# Patient Record
Sex: Female | Born: 1970 | Race: Black or African American | Hispanic: No | Smoking: Current some day smoker
Health system: Southern US, Community
[De-identification: ages and names within clinical notes are randomized; demographics above are authoritative.]

## PROBLEM LIST (undated history)

## (undated) DIAGNOSIS — M549 Dorsalgia, unspecified: Secondary | ICD-10-CM

## (undated) DIAGNOSIS — G8929 Other chronic pain: Secondary | ICD-10-CM

## (undated) DIAGNOSIS — M75112 Incomplete rotator cuff tear or rupture of left shoulder, not specified as traumatic: Secondary | ICD-10-CM

## (undated) DIAGNOSIS — J45909 Unspecified asthma, uncomplicated: Secondary | ICD-10-CM

## (undated) DIAGNOSIS — R569 Unspecified convulsions: Secondary | ICD-10-CM

## (undated) DIAGNOSIS — M1990 Unspecified osteoarthritis, unspecified site: Secondary | ICD-10-CM

## (undated) DIAGNOSIS — Q851 Tuberous sclerosis: Secondary | ICD-10-CM

## (undated) HISTORY — PX: ABLATION: SHX5711

---

## 2012-05-11 DIAGNOSIS — Q048 Other specified congenital malformations of brain: Secondary | ICD-10-CM | POA: Insufficient documentation

## 2015-02-11 DIAGNOSIS — G43009 Migraine without aura, not intractable, without status migrainosus: Secondary | ICD-10-CM | POA: Insufficient documentation

## 2015-11-21 ENCOUNTER — Emergency Department (HOSPITAL_COMMUNITY): Payer: Self-pay

## 2015-11-21 ENCOUNTER — Encounter (HOSPITAL_COMMUNITY): Payer: Self-pay | Admitting: *Deleted

## 2015-11-21 ENCOUNTER — Emergency Department (HOSPITAL_COMMUNITY)
Admission: EM | Admit: 2015-11-21 | Discharge: 2015-11-22 | Disposition: A | Discharge status: Home / Self Care | Payer: Self-pay | Attending: Emergency Medicine | Admitting: Emergency Medicine

## 2015-11-21 DIAGNOSIS — R109 Unspecified abdominal pain: Secondary | ICD-10-CM | POA: Insufficient documentation

## 2015-11-21 DIAGNOSIS — J45909 Unspecified asthma, uncomplicated: Secondary | ICD-10-CM | POA: Insufficient documentation

## 2015-11-21 DIAGNOSIS — M7989 Other specified soft tissue disorders: Secondary | ICD-10-CM | POA: Insufficient documentation

## 2015-11-21 DIAGNOSIS — M25561 Pain in right knee: Secondary | ICD-10-CM | POA: Insufficient documentation

## 2015-11-21 DIAGNOSIS — F17210 Nicotine dependence, cigarettes, uncomplicated: Secondary | ICD-10-CM | POA: Insufficient documentation

## 2015-11-21 HISTORY — DX: Dorsalgia, unspecified: M54.9

## 2015-11-21 HISTORY — DX: Tuberous sclerosis: Q85.1

## 2015-11-21 HISTORY — DX: Unspecified convulsions: R56.9

## 2015-11-21 HISTORY — DX: Unspecified asthma, uncomplicated: J45.909

## 2015-11-21 NOTE — ED Provider Notes (Signed)
[facility]Y DEPT Provider Note   CSN: [id number] Arrival date & time: 11/21/15  2021  First Provider Contact:  First MD Initiated Contact with Patient 11/21/15 2205        History   Chief Complaint Chief Complaint  Patient presents with  . Knee Pain    HPI Misty Foster is a 45 y.o. female.  Patient is a 45 year old female with past medical history of seizures, asthma, osteoarthritis and tuberous sclerosis who presents to the ED with complaint of right knee pain and leg swelling. Patient reports having swelling to bilateral legs and feet for the past few weeks. She notes while she was at work this morning she began to have discomfort which she describes as "fullness and stiffness" to her right knee. Patient reports difficulty ambulating due to fullness of her right knee. Patient denies any known injury or recent fall but notes that she recently joined a gym and states she moved a few days ago where she was doing more physical activity compared to her usual routine. Patient reports symptoms worsen with flexion of her right knee. Denies fever, chills, cough, shortness of breath, chest pain, abdominal pain, nausea, vomiting, urinary symptoms, redness, warmth, numbness, tingling, weakness. Denies history of DVT/PE, recent hospitalization, recent airplane/car travel, hx of cancer or recent immobilization.     Knee Pain      Past Medical History:  Diagnosis Date  . Asthma   . Back pain   . Seizures (HCC)   . Tuberous sclerosis (HCC)     There are no active problems to display for this patient.   Past Surgical History:  Procedure Laterality Date  . ABLATION    . CESAREAN SECTION      OB History    No data available       Home Medications    Prior to Admission medications   Not on File    Family History No family history on file.  Social History Social History  Substance Use Topics  . Smoking status: Current Every Day Smoker    Types: Cigarettes  .  Smokeless tobacco: Never Used  . Alcohol use No     Allergies   Lamictal [lamotrigine]   Review of Systems Review of Systems  Cardiovascular: Positive for leg swelling.  Musculoskeletal: Positive for arthralgias (right knee) and joint swelling.  All other systems reviewed and are negative.    Physical Exam Updated Vital Signs BP 132/76 (BP Location: Right Arm)   Pulse 65   Temp 98.4 F (36.9 C) (Oral)   Resp 19   Ht  (1.651 m)   Wt (!) 158.1 kg   LMP  (Approximate)   SpO2 95%   BMI 58.01 kg/m   Physical Exam  Constitutional: She is oriented to person, place, and time. She appears well-developed and well-nourished.  Morbidly obese female  HENT:  Head: Normocephalic and atraumatic.  Eyes: Conjunctivae and EOM are normal. Right eye exhibits no discharge. Left eye exhibits no discharge. No scleral icterus.  Neck: Normal range of motion. Neck supple.  Cardiovascular: Normal rate, regular rhythm, normal heart sounds and intact distal pulses.   Pulmonary/Chest: Effort normal and breath sounds normal. No respiratory distress. She has no wheezes. She has no rales. She exhibits no tenderness.  Abdominal: Soft. She exhibits no distension. There is tenderness.  Musculoskeletal: She exhibits edema and tenderness.       Right knee: She exhibits decreased range of motion and swelling. She exhibits no effusion, no  ecchymosis, no deformity, no laceration, no erythema, normal alignment, no LCL laxity, normal patellar mobility and no MCL laxity. Tenderness found.       Legs: Mild nonpitting edema noted to bilateral lower extremities. Mild swelling noted to right suprapatellar region, mild tenderness to palpation. Decreased range of motion of right knee due to reported pain. No ACL/MCL/LCL/PCL laxity noted. 2+ DP pulses. Sensation grossly intact. Cap refill less than 2.  Neurological: She is alert and oriented to person, place, and time.  Skin: Skin is warm and dry.  Nursing note and  vitals reviewed.    ED Treatments / Results  Labs (all labs ordered are listed, but only abnormal results are displayed) Labs Reviewed  CBC WITH DIFFERENTIAL/PLATELET - Abnormal; Notable for the following:       Result Value   RBC 3.58 (*)    Hemoglobin 10.6 (*)    HCT 32.9 (*)    All other components within normal limits  COMPREHENSIVE METABOLIC PANEL - Abnormal; Notable for the following:    Calcium 8.8 (*)    Albumin 3.1 (*)    ALT 11 (*)    Total Bilirubin 0.1 (*)    All other components within normal limits    EKG  EKG Interpretation None       Radiology Dg Knee Complete 4 Views Right  Result Date: 11/21/2015 CLINICAL DATA:  Right knee pain and swelling, no known injury, initial encounter EXAM: RIGHT KNEE - COMPLETE 4+ VIEW COMPARISON:  None. FINDINGS: Mild medial joint space narrowing is noted. No acute fracture or dislocation is noted. No joint effusion is seen. No other soft tissue abnormality is noted. IMPRESSION: Mild degenerative changes without acute abnormality. Electronically Signed   By: Alcide Clever M.D.   On: 11/21/2015 23:10   Procedures Procedures (including critical care time)  Medications Ordered in ED Medications - No data to display   Initial Impression / Assessment and Plan / ED Course  I have reviewed the triage vital signs and the nursing notes.  Pertinent labs & imaging results that were available during my care of the patient were reviewed by me and considered in my medical decision making (see chart for details).  Clinical Course    Pt Presents with bilateral leg swelling for the past month and right knee pain that started this morning. Patient endorses increased activity and reports recently joined a gym and notes she moved a few days ago. Denies any recent fall or specific injury. No DVT risk factors. VSS. Exam revealed mild nonpitting edema to bilateral lower extremities. Mild swelling noted to right anterior knee with mild tenderness,  decreased flexion of right knee due to reported pain, remaining right knee exam unremarkable. Bilateral lower extremities neurovascularly intact. Well's score 0. I do not suspect DVT at this time. Right knee x-ray negative. Labs unremarkable. Plan to d/c pt home with symptomatic tx and advise pt to follow up with PCP in the next week for follow up. I feel pt is appropriate to be d/c home with outpatient follow up and does not warrant emergent initiation of diuretics at this time, BLE swelling mild on exam. Discussed strict return precautions with pt.   Final Clinical Impressions(s) / ED Diagnoses   Final diagnoses:  Right knee pain  Leg swelling    New Prescriptions New Prescriptions   No medications on file     Barrett Henle, PA-C 11/22/15 0102    Zadie Rhine, MD 11/22/15 [phone]

## 2015-11-21 NOTE — ED Triage Notes (Signed)
Patient presents stating she has had swelling in her feet for several weeks (has an appointment for pain mgt on the 31st) but today she is c/o pain behind the  right knee having difficulty bending it.  This nurse noticed sweeling to the legs bilaterally

## 2015-11-22 LAB — COMPREHENSIVE METABOLIC PANEL
ALBUMIN: 3.1 g/dL — AB (ref 3.5–5.0)
ALT: 11 U/L — ABNORMAL LOW (ref 14–54)
ANION GAP: 7 (ref 5–15)
AST: 16 U/L (ref 15–41)
Alkaline Phosphatase: 70 U/L (ref 38–126)
BUN: 7 mg/dL (ref 6–20)
CHLORIDE: 106 mmol/L (ref 101–111)
CO2: 27 mmol/L (ref 22–32)
Calcium: 8.8 mg/dL — ABNORMAL LOW (ref 8.9–10.3)
Creatinine, Ser: 0.74 mg/dL (ref 0.44–1.00)
GFR calc Af Amer: >60 mL/min (ref >60)
Glucose, Bld: 94 mg/dL (ref 65–99)
POTASSIUM: 3.8 mmol/L (ref 3.5–5.1)
Sodium: 140 mmol/L (ref 135–145)
TOTAL PROTEIN: 6.7 g/dL (ref 6.5–8.1)
Total Bilirubin: 0.1 mg/dL — ABNORMAL LOW (ref 0.3–1.2)

## 2015-11-22 LAB — CBC WITH DIFFERENTIAL/PLATELET
BASOS ABS: 0 10*3/uL (ref 0.0–0.1)
BASOS PCT: 0 %
EOS PCT: 4 %
Eosinophils Absolute: 0.2 10*3/uL (ref 0.0–0.7)
HCT: 32.9 % — ABNORMAL LOW (ref 36.0–46.0)
Hemoglobin: 10.6 g/dL — ABNORMAL LOW (ref 12.0–15.0)
Lymphocytes Relative: 37 %
Lymphs Abs: 2.3 10*3/uL (ref 0.7–4.0)
MCH: 29.6 pg (ref 26.0–34.0)
MCHC: 32.2 g/dL (ref 30.0–36.0)
MCV: 91.9 fL (ref 78.0–100.0)
MONO ABS: 0.4 10*3/uL (ref 0.1–1.0)
MONOS PCT: 6 %
Neutro Abs: 3.3 10*3/uL (ref 1.7–7.7)
Neutrophils Relative %: 53 %
PLATELETS: 175 10*3/uL (ref 150–400)
RBC: 3.58 MIL/uL — ABNORMAL LOW (ref 3.87–5.11)
RDW: 13.3 % (ref 11.5–15.5)
WBC: 6.2 10*3/uL (ref 4.0–10.5)

## 2015-11-22 NOTE — ED Notes (Signed)
Pt verbalized understanding of d/c instructions and has no further questions. Pt stable and NAD. Pt d/cf home with daughter driving. Pt removed all belongings from room.

## 2015-11-22 NOTE — Discharge Instructions (Signed)
I recommend taking 600mg  Ibuprofen every 6 hours as needed for pain relief. You may also apply ice to your right knee for 15-20 minutes 3-4 times daily to help with pain and swelling. Recommend following up with the primary care clinic listed above for further management of your leg swelling and follow-up of your right knee pain. Return to the emergency department if symptoms worsen or new onset of fever, chest pain, shortness of breath, abdominal pain, numbness, tingling, weakness, redness.

## 2015-12-05 ENCOUNTER — Encounter (HOSPITAL_COMMUNITY): Payer: Self-pay | Admitting: *Deleted

## 2015-12-05 ENCOUNTER — Emergency Department (HOSPITAL_COMMUNITY): Payer: Self-pay

## 2015-12-05 ENCOUNTER — Emergency Department (HOSPITAL_COMMUNITY)
Admission: EM | Admit: 2015-12-05 | Discharge: 2015-12-05 | Disposition: A | Discharge status: Home / Self Care | Payer: Self-pay | Attending: Emergency Medicine | Admitting: Emergency Medicine

## 2015-12-05 DIAGNOSIS — Y999 Unspecified external cause status: Secondary | ICD-10-CM | POA: Insufficient documentation

## 2015-12-05 DIAGNOSIS — X58XXXA Exposure to other specified factors, initial encounter: Secondary | ICD-10-CM | POA: Insufficient documentation

## 2015-12-05 DIAGNOSIS — F17210 Nicotine dependence, cigarettes, uncomplicated: Secondary | ICD-10-CM | POA: Insufficient documentation

## 2015-12-05 DIAGNOSIS — Y929 Unspecified place or not applicable: Secondary | ICD-10-CM | POA: Insufficient documentation

## 2015-12-05 DIAGNOSIS — J45909 Unspecified asthma, uncomplicated: Secondary | ICD-10-CM | POA: Insufficient documentation

## 2015-12-05 DIAGNOSIS — S76219A Strain of adductor muscle, fascia and tendon of unspecified thigh, initial encounter: Secondary | ICD-10-CM

## 2015-12-05 DIAGNOSIS — Y939 Activity, unspecified: Secondary | ICD-10-CM | POA: Insufficient documentation

## 2015-12-05 DIAGNOSIS — S76212A Strain of adductor muscle, fascia and tendon of left thigh, initial encounter: Secondary | ICD-10-CM | POA: Insufficient documentation

## 2015-12-05 HISTORY — DX: Dorsalgia, unspecified: M54.9

## 2015-12-05 HISTORY — DX: Other chronic pain: G89.29

## 2015-12-05 MED ORDER — KETOROLAC TROMETHAMINE 30 MG/ML IJ SOLN
30.0000 mg | Freq: Once | INTRAMUSCULAR | Status: AC
  Administered 2015-12-05: 30 mg via INTRAMUSCULAR
  Filled 2015-12-05: qty 1

## 2015-12-05 NOTE — ED Provider Notes (Signed)
[facility] Provider Note   CSN: [id number] Arrival date & time: 12/05/15  1191  First Provider Contact:  First MD Initiated Contact with Patient 12/05/15 [phone]        History   Chief Complaint Chief Complaint  Patient presents with  . Pain  . Leg Pain    HPI Misty Foster is a 45 y.o. female.  Misty Foster is a 45 y.o. female  with a PMH of chronic back pain, asthma, seizures who presents to the Emergency Department complaining of worsening left hip/groin pain 2 days. Patient states she recently moved to [facility] and has been unpacking/moving over the last 2-3 weeks. Pain is worse with ambulation and moving leg away from body. Improved when resting, lying still. Taking mobic and baclofen with little relief (these meds were given for chronic back pain). Patient states she has been walking with a limp since yesterday, but ambulatory. Hx of chronic knee and low back pain, but has never experienced pain in this area before.    The history is provided by the patient and medical records. No language interpreter was used.  Leg Pain      Past Medical History:  Diagnosis Date  . Asthma   . Back pain   . Chronic back pain   . Seizures (HCC)   . Tuberous sclerosis (HCC)     There are no active problems to display for this patient.   Past Surgical History:  Procedure Laterality Date  . ABLATION    . CESAREAN SECTION      OB History    No data available       Home Medications    Prior to Admission medications   Not on File    Family History No family history on file.  Social History Social History  Substance Use Topics  . Smoking status: Current Every Day Smoker    Types: Cigarettes  . Smokeless tobacco: Never Used  . Alcohol use No     Allergies   Lamictal [lamotrigine]   Review of Systems Review of Systems  Gastrointestinal: Negative for abdominal pain, nausea and vomiting.  Musculoskeletal: Positive for arthralgias.    Skin: Negative for color change.     Physical Exam Updated Vital Signs BP 120/71 (BP Location: Right Wrist)   Pulse (!) 57   Temp 97.7 F (36.5 C) (Oral)   Resp 17   Ht  (1.651 m)   Wt (!) 154.2 kg   LMP  (Approximate) Comment: PT has a referral to [facility]  for irregular menses. Pt has not been to [facility]  SpO2 100%   BMI 56.58 kg/m   Physical Exam  Constitutional: She is oriented to person, place, and time.  WDWN obese female in NAD.   HENT:  Head: Normocephalic and atraumatic.  Cardiovascular: Normal rate, regular rhythm and normal heart sounds.   2+ DP bilaterally.   Pulmonary/Chest: Effort normal and breath sounds normal. No respiratory distress.  Abdominal: Soft. Bowel sounds are normal. She exhibits no distension. There is no tenderness.  Musculoskeletal:  TTP of left groin. No hernia appreciated. Full ROM, but pain with hip flexion and abduction. 5/5 muscle strength in bilateral LE's. No calf or knee tenderness. No midline T/L spine tenderness. No overlying skin changes.   Neurological: She is alert and oriented to person, place, and time.  Bilateral lower extremities neurovascularly intact.   Skin: Skin is warm and dry.  Nursing note and vitals reviewed.  ED Treatments / Results  Labs (all labs ordered are listed, but only abnormal results are displayed) Labs Reviewed - No data to display  EKG  EKG Interpretation None       Radiology Dg Hip Unilat W Or Wo Pelvis 2-3 Views Left  Result Date: 12/05/2015 CLINICAL DATA:  Left hip and groin pain today. No known injury. Initial encounter. EXAM: DG HIP (WITH OR WITHOUT PELVIS) 2-3V LEFT COMPARISON:  None. FINDINGS: There is no evidence of hip fracture or dislocation. There is no evidence of arthropathy or other focal bone abnormality. IMPRESSION: Negative exam. Electronically Signed   By: Drusilla Kannerhomas  Dalessio M.D.   On: 12/05/2015 08:27    Procedures Procedures (including critical care  time)  Medications Ordered in ED Medications  ketorolac (TORADOL) 30 MG/ML injection 30 mg (30 mg Intramuscular Given 12/05/15 0846)     Initial Impression / Assessment and Plan / ED Course  I have reviewed the triage vital signs and the nursing notes.  Pertinent labs & imaging results that were available during my care of the patient were reviewed by me and considered in my medical decision making (see chart for details).  Clinical Course   Misty Foster is a 45 y.o. female who presents to ED for evaluation of left groin pain x 2 days. Pain is worse with movement. She is able to ambulate, however does have antalgic gait. On exam, she is tender to palpation of left groin - No hernias appreciated. No abdominal pain. No new back or knee pain. X-ray obtained which was unremarkable. Toradol IM for pain relief while in ED. Patient has a follow-up appointment with her sports medicine physician on Friday. Symptomatic home care including continuation of meloxicam as well as ice, rest and keep scheduled appointment on Friday for recheck of symptoms. Reasons to return to ED discussed and all questions answered.  Final Clinical Impressions(s) / ED Diagnoses   Final diagnoses:  Groin strain, initial encounter    New Prescriptions There are no discharge medications for this patient.    [facility] Ray[facility] Memorial Veterans CenterJaime Pilcher Kassandra Meriweather, PA-C 12/05/15 [phone]    Vanetta MuldersScott Zackowski, MD 12/06/15 1038

## 2015-12-05 NOTE — ED Triage Notes (Signed)
PT rfeports lower lt back pain into LT hip. Pt reports her LT leg locks up.

## 2015-12-05 NOTE — ED Notes (Signed)
patient placed in gown

## 2015-12-05 NOTE — Discharge Instructions (Signed)
You had an x-ray of your left hip and pelvis which was negative. Keep your scheduled appointment with your sports medicine physician on Friday for recheck of symptoms. Ice affected area for pain relief. Continue taking your myopic as needed for pain relief as well. Return to ER for any new or worsening symptoms, any additional concerns.

## 2015-12-05 NOTE — ED Notes (Signed)
Declined W/C at D/C and was escorted to lobby by RN. 

## 2016-02-05 ENCOUNTER — Emergency Department (HOSPITAL_COMMUNITY)
Admission: EM | Admit: 2016-02-05 | Discharge: 2016-02-05 | Disposition: A | Discharge status: Home / Self Care | Payer: Self-pay | Attending: Emergency Medicine | Admitting: Emergency Medicine

## 2016-02-05 ENCOUNTER — Encounter (HOSPITAL_COMMUNITY): Payer: Self-pay | Admitting: Emergency Medicine

## 2016-02-05 DIAGNOSIS — J45909 Unspecified asthma, uncomplicated: Secondary | ICD-10-CM | POA: Insufficient documentation

## 2016-02-05 DIAGNOSIS — Z5321 Procedure and treatment not carried out due to patient leaving prior to being seen by health care provider: Secondary | ICD-10-CM | POA: Insufficient documentation

## 2016-02-05 DIAGNOSIS — M542 Cervicalgia: Secondary | ICD-10-CM | POA: Insufficient documentation

## 2016-02-05 DIAGNOSIS — F17210 Nicotine dependence, cigarettes, uncomplicated: Secondary | ICD-10-CM | POA: Insufficient documentation

## 2016-02-05 DIAGNOSIS — M62830 Muscle spasm of back: Secondary | ICD-10-CM | POA: Insufficient documentation

## 2016-02-05 NOTE — ED Triage Notes (Signed)
Pt c/o back spasms and a knot in her neck. Pt hx of back pain and spasms and its gotten worse. Pt neck is sore on the right side, no obvious swelling.

## 2016-04-01 ENCOUNTER — Emergency Department (HOSPITAL_COMMUNITY)
Admission: EM | Admit: 2016-04-01 | Discharge: 2016-04-01 | Disposition: A | Discharge status: Home / Self Care | Payer: BLUE CROSS/BLUE SHIELD | Attending: Emergency Medicine | Admitting: Emergency Medicine

## 2016-04-01 ENCOUNTER — Encounter (HOSPITAL_COMMUNITY): Payer: Self-pay | Admitting: Emergency Medicine

## 2016-04-01 DIAGNOSIS — J45909 Unspecified asthma, uncomplicated: Secondary | ICD-10-CM | POA: Diagnosis not present

## 2016-04-01 DIAGNOSIS — B3731 Acute candidiasis of vulva and vagina: Secondary | ICD-10-CM

## 2016-04-01 DIAGNOSIS — B373 Candidiasis of vulva and vagina: Secondary | ICD-10-CM | POA: Diagnosis not present

## 2016-04-01 DIAGNOSIS — N898 Other specified noninflammatory disorders of vagina: Secondary | ICD-10-CM | POA: Diagnosis present

## 2016-04-01 DIAGNOSIS — F17210 Nicotine dependence, cigarettes, uncomplicated: Secondary | ICD-10-CM | POA: Diagnosis not present

## 2016-04-01 MED ORDER — TRIAMCINOLONE ACETONIDE 0.1 % EX CREA: 1.0000 "application " | TOPICAL_CREAM | Freq: Two times a day (BID) | CUTANEOUS | 0 refills | Status: DC

## 2016-04-01 MED ORDER — FLUCONAZOLE 200 MG PO TABS: 200.0000 mg | ORAL_TABLET | Freq: Every day | ORAL | 0 refills | Status: AC

## 2016-04-01 NOTE — ED Triage Notes (Signed)
Pt states she has had vaginal itching starting 1-2 weeks ago after having intercourse with her current partner. Pt denies discharge

## 2016-04-01 NOTE — ED Provider Notes (Signed)
Emergency Department Provider Note   I have reviewed the triage vital signs and the nursing notes.   HISTORY  Chief Complaint Vaginal Itching   HPI Misty Foster is a 45 y.o. female past medical history significant for asthma, chronic back pain and seizures came to the ED with complaint of vagina itchiness for 1-1/2 week. She denies any discharge or bed order. She denies any lower abdominal pain, dysuria, burning micturition, fever or chills. She states that she recently started a new deodorant, and that might be causing some irritation so she stopped it couple of days ago.   Past Medical History:  Diagnosis Date  . Asthma   . Back pain   . Chronic back pain   . Seizures (HCC)   . Tuberous sclerosis (HCC)     There are no active problems to display for this patient.   Past Surgical History:  Procedure Laterality Date  . ABLATION    . CESAREAN SECTION        Allergies Lamictal [lamotrigine]  History reviewed. No pertinent family history.  Social History Social History  Substance Use Topics  . Smoking status: Current Every Day Smoker    Types: Cigarettes  . Smokeless tobacco: Never Used  . Alcohol use No    Review of Systems Constitutional: No fever/chills Eyes: No visual changes. ENT: No sore throat. Cardiovascular: Denies chest pain. Respiratory: Denies shortness of breath. Gastrointestinal: No abdominal pain.  No nausea, no vomiting.  No diarrhea.  No constipation. Genitourinary: vaginal itching Musculoskeletal: Negative for back pain. Skin: Negative for rash. Neurological: Negative for headaches, focal weakness or numbness. 10-point ROS otherwise negative.  ____________________________________________   PHYSICAL EXAM:  VITAL SIGNS: ED Triage Vitals  Enc Vitals Group     BP 04/01/16 0704 130/88     Pulse Rate 04/01/16 0704 72     Resp 04/01/16 0704 18     Temp 04/01/16 0704 97.6 F (36.4 C)     Temp Source 04/01/16 0704 Oral       SpO2 04/01/16 0704 100 %     Weight 04/01/16 0705 (!) 342 lb (155.1 kg)     Height 04/01/16 0705 5\' 5"  (1.651 m)     Head Circumference --      Peak Flow --      Pain Score --      Pain Loc --      Pain Edu? --      Excl. in GC? --     Constitutional: Alert and oriented. Well appearing and in no acute distress. Eyes: Conjunctivae are normal. PERRL. EOMI. Head: Atraumatic. Mouth/Throat: Mucous membranes are moist.  Oropharynx non-erythematous. Neck: No stridor.  No meningeal signs.  Cardiovascular: Normal rate, regular rhythm. Good peripheral circulation. Grossly normal heart sounds.   Respiratory: Normal respiratory effort.  No retractions. Lungs CTAB. Gastrointestinal: Soft and nontender. No distention.  Genitourinary: Thick milky white vaginal discharge, with few erythematic lesions in pubic hair area,no ulceration. Musculoskeletal: No lower extremity tenderness nor edema. No gross deformities of extremities. Neurologic:  Normal speech and language. No gross focal neurologic deficits are appreciated.  Skin:  Skin is warm, dry and intact. No rash noted.  ____________________________________________   LABS (all labs ordered are listed, but only abnormal results are displayed)  Labs Reviewed - No data to display ____________________________________________  EKG  None ____________________________________________  RADIOLOGY  No results found.  ____________________________________________   PROCEDURES  Procedure(s) performed:   Procedures   ____________________________________________   INITIAL  IMPRESSION / ASSESSMENT AND PLAN / ED COURSE  Pertinent labs & imaging results that were available during my care of the patient were reviewed by me and considered in my medical decision making (see chart for details).  As her symptoms and exam are consistent with vaginal candidias with some contact dermatitis.  She can be discharged home with Diflucan and  triamcinolone cream. [place] 6 [place] ago, she has not established any care in this area yet. She was advised to find a PCP in this area, so she can follow-up with them.  FINAL CLINICAL IMPRESSION(S) / ED DIAGNOSES  Final diagnoses:  None  Vaginal Candiditis.   MEDICATIONS GIVEN DURING THIS VISIT:  Medications - No data to display   NEW OUTPATIENT MEDICATIONS STARTED DURING THIS VISIT:  New Prescriptions   No medications on file      Note:  This document was prepared using Dragon voice recognition software and may include unintentional dictation errors.  Alona BeneJoshua Long, MD Emergency Medicine   Arnetha CourserSumayya Shelle Galdamez, MD 04/01/16 [id number]    Melene Planan Floyd, DO 04/01/16 [id number]

## 2016-04-01 NOTE — ED Notes (Signed)
Pelvic cart at bedside. 

## 2016-05-01 DIAGNOSIS — M549 Dorsalgia, unspecified: Secondary | ICD-10-CM

## 2016-05-01 DIAGNOSIS — G8929 Other chronic pain: Secondary | ICD-10-CM | POA: Insufficient documentation

## 2016-05-07 ENCOUNTER — Emergency Department (HOSPITAL_COMMUNITY)
Admission: EM | Admit: 2016-05-07 | Discharge: 2016-05-07 | Disposition: A | Discharge status: Home / Self Care | Payer: Managed Care, Other (non HMO) | Attending: Emergency Medicine | Admitting: Emergency Medicine

## 2016-05-07 ENCOUNTER — Emergency Department (HOSPITAL_COMMUNITY): Payer: Managed Care, Other (non HMO)

## 2016-05-07 ENCOUNTER — Encounter (HOSPITAL_COMMUNITY): Payer: Self-pay | Admitting: *Deleted

## 2016-05-07 DIAGNOSIS — Z87891 Personal history of nicotine dependence: Secondary | ICD-10-CM | POA: Diagnosis not present

## 2016-05-07 DIAGNOSIS — J45909 Unspecified asthma, uncomplicated: Secondary | ICD-10-CM | POA: Insufficient documentation

## 2016-05-07 DIAGNOSIS — R0602 Shortness of breath: Secondary | ICD-10-CM | POA: Insufficient documentation

## 2016-05-07 DIAGNOSIS — R079 Chest pain, unspecified: Secondary | ICD-10-CM | POA: Diagnosis not present

## 2016-05-07 DIAGNOSIS — R030 Elevated blood-pressure reading, without diagnosis of hypertension: Secondary | ICD-10-CM | POA: Diagnosis not present

## 2016-05-07 DIAGNOSIS — Z79899 Other long term (current) drug therapy: Secondary | ICD-10-CM | POA: Diagnosis not present

## 2016-05-07 DIAGNOSIS — R51 Headache: Secondary | ICD-10-CM | POA: Diagnosis present

## 2016-05-07 HISTORY — DX: Unspecified osteoarthritis, unspecified site: M19.90

## 2016-05-07 LAB — I-STAT TROPONIN, ED
TROPONIN I, POC: 0.01 ng/mL (ref 0.00–0.08)
Troponin i, poc: 0 ng/mL (ref 0.00–0.08)

## 2016-05-07 LAB — BASIC METABOLIC PANEL
ANION GAP: 7 (ref 5–15)
BUN: 7 mg/dL (ref 6–20)
CALCIUM: 8.8 mg/dL — AB (ref 8.9–10.3)
CO2: 27 mmol/L (ref 22–32)
Chloride: 106 mmol/L (ref 101–111)
Creatinine, Ser: 0.69 mg/dL (ref 0.44–1.00)
GFR calc Af Amer: >60 mL/min (ref >60)
GFR calc non Af Amer: >60 mL/min (ref >60)
GLUCOSE: 112 mg/dL — AB (ref 65–99)
Potassium: 3.6 mmol/L (ref 3.5–5.1)
Sodium: 140 mmol/L (ref 135–145)

## 2016-05-07 LAB — CBC WITH DIFFERENTIAL/PLATELET
BASOS ABS: 0 10*3/uL (ref 0.0–0.1)
Basophils Relative: 0 %
EOS PCT: 3 %
Eosinophils Absolute: 0.1 10*3/uL (ref 0.0–0.7)
HEMATOCRIT: 34.2 % — AB (ref 36.0–46.0)
Hemoglobin: 11.2 g/dL — ABNORMAL LOW (ref 12.0–15.0)
LYMPHS ABS: 1.8 10*3/uL (ref 0.7–4.0)
LYMPHS PCT: 33 %
MCH: 30.3 pg (ref 26.0–34.0)
MCHC: 32.7 g/dL (ref 30.0–36.0)
MCV: 92.4 fL (ref 78.0–100.0)
MONOS PCT: 5 %
Monocytes Absolute: 0.3 10*3/uL (ref 0.1–1.0)
NEUTROS ABS: 3.4 10*3/uL (ref 1.7–7.7)
Neutrophils Relative %: 59 %
PLATELETS: 188 10*3/uL (ref 150–400)
RBC: 3.7 MIL/uL — ABNORMAL LOW (ref 3.87–5.11)
RDW: 13.2 % (ref 11.5–15.5)
WBC: 5.6 10*3/uL (ref 4.0–10.5)

## 2016-05-07 LAB — BRAIN NATRIURETIC PEPTIDE: B Natriuretic Peptide: 64.5 pg/mL (ref 0.0–100.0)

## 2016-05-07 LAB — I-STAT BETA HCG BLOOD, ED (MC, WL, AP ONLY): I-stat hCG, quantitative: <5 m[IU]/mL (ref <5)

## 2016-05-07 NOTE — ED Notes (Signed)
Eating crackers

## 2016-05-07 NOTE — ED Triage Notes (Signed)
C/o several recent elevated blood pressure reading since first of Dec. C/o resp problems with increased sob states she has noticed more sob. is currently being seen at  Pain clinic, c/o different feeling in her chest since starting a new medication, describes at tingling feeling

## 2016-05-07 NOTE — ED Provider Notes (Addendum)
[facility] DEPT Provider Note   CSN: [id number] Arrival date & time: 05/07/16  6213     History   Chief Complaint Chief Complaint  Patient presents with  . Headache    HPI Misty Foster is a 46 y.o. female.  HPI  46 year old female who presents with concern for elevated BP over past month. She has a history of chronic low back pain, seizure disorder, and asthma. States that around Christmas time she had an influenza-like illness, and during this illness and afterwards noted more elevated blood pressures. Had one reading at the doctor's office showing 190-200 systolic during her illness. She had bariatric surgery appointment over this past month as well, showing a blood pressure of 145/90. States that during this past year she has had a 50-60 pound weight gain. She has occasionally had headaches and the feeling of dizziness. At times at work feels very fatigued. Has also noticed that since being started on duloxetine and meloxicam she has had episodes of tingling across her chest wall. She stopped these medications, but was still having these intermittent symptoms. 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 Since her influenza illness, she has also noted increased shortness of breath with activity. Quit smoking one month ago, but still having symptoms. No syncope, near syncope. No current fever or chills. No leg swelling, leg pain, orthopnea and PND.  Past Medical History:  Diagnosis Date  . Arthritis   . Asthma   . Back pain   . Chronic back pain   . Seizures (HCC)   . Tuberous sclerosis (HCC)     There are no active problems to display for this patient.   Past Surgical History:  Procedure Laterality Date  . ABLATION    . CESAREAN SECTION      OB History    No data available       Home Medications    Prior to Admission medications   Medication Sig Start Date End Date  Taking? Authorizing Provider  albuterol (VENTOLIN HFA) 108 (90 Base) MCG/ACT inhaler Inhale 1 puff into the lungs every 4 (four) hours as needed for wheezing. 09/13/15  Yes Historical Provider, MD  baclofen (LIORESAL) 10 MG tablet Take 20 mg by mouth daily. 05/03/16  Yes Historical Provider, MD  diphenhydramine-acetaminophen (TYLENOL PM) 25-500 MG TABS tablet Take 1 tablet by mouth at bedtime as needed (sleep).   Yes Historical Provider, MD  PHENobarbital (LUMINAL) 97.2 MG tablet Take 1.5 tablets by mouth every evening. 04/30/16  Yes Historical Provider, MD  triamcinolone cream (KENALOG) 0.1 % Apply 1 application topically 2 (two) times daily. Patient not taking: Reported on 05/07/2016 04/01/16   Arnetha Courser, MD    Family History History reviewed. No pertinent family history.  Social History Social History  Substance Use Topics  . Smoking status: Former Smoker    Types: Cigarettes  . Smokeless tobacco: Never Used  . Alcohol use No     Allergies   Lamictal [lamotrigine] and Hydrocodone-acetaminophen   Review of Systems Review of Systems 10/14 systems reviewed and are negative other than those stated in the HPI   Physical Exam Updated Vital Signs BP (!) 123/49 (BP Location: Right Arm)   Pulse 74   Temp 97.8 F (36.6 C) (Oral)   Resp 19   Ht 5\' 4"  (1.626 m)   Wt (!) 342 lb (155.1 kg)   LMP 04/13/2016 (Approximate)  SpO2 99%   BMI 58.70 kg/m   Physical Exam Physical Exam  Nursing note and vitals reviewed. Constitutional: Well developed, well nourished, non-toxic, and in no acute distress Head: Normocephalic and atraumatic.  Mouth/Throat: Oropharynx is clear and moist.  Neck: Normal range of motion. Neck supple.  Cardiovascular: Normal rate and regular rhythm.   Pulmonary/Chest: Effort normal and breath sounds normal.  Abdominal: Soft. Obese There is no tenderness. There is no rebound and no guarding.  Musculoskeletal: Normal range of motion. trace pedal  edema Neurological: Alert, no facial droop, fluent speech, moves all extremities symmetrically Skin: Skin is warm and dry.  Psychiatric: Cooperative   ED Treatments / Results  Labs (all labs ordered are listed, but only abnormal results are displayed) Labs Reviewed  CBC WITH DIFFERENTIAL/PLATELET - Abnormal; Notable for the following:       Result Value   RBC 3.70 (*)    Hemoglobin 11.2 (*)    HCT 34.2 (*)    All other components within normal limits  BASIC METABOLIC PANEL - Abnormal; Notable for the following:    Glucose, Bld 112 (*)    Calcium 8.8 (*)    All other components within normal limits  BRAIN NATRIURETIC PEPTIDE  I-STAT BETA HCG BLOOD, ED ([facility], [facility], [facility] ONLY)  I-STAT TROPOININ, ED  I-STAT TROPOININ, ED    EKG  EKG Interpretation  Date/Time:  Wednesday May 07 2016 10:30:27 EST Ventricular Rate:  64 PR Interval:  154 QRS Duration: 104 QT Interval:  438 QTC Calculation: 452 R Axis:   68 Text Interpretation:  Sinus rhythm similar to prior EKG  Confirmed by Rufus Beske MD, Alesha Jaffee ([id number]) on 05/07/2016 10:38:45 AM       Radiology Dg Chest 2 View  Result Date: 05/07/2016 CLINICAL DATA:  Increased back pain over the past 2 weeks with development of mid chest discomfort over the past 2-3 days. Discontinued smoking 2 months ago. No known cardiopulmonary history. EXAM: CHEST  2 VIEW COMPARISON:  None in PACs FINDINGS: The lungs are adequately inflated and clear. The heart and pulmonary vascularity are normal. The mediastinum is normal in width. There is no pleural effusion. The bony thorax exhibits no acute abnormality. IMPRESSION: There is no active cardiopulmonary disease. The observed bony thorax exhibits no acute abnormality. Electronically Signed   By: David  Swazil[place]Jordan M.D.   On: 05/07/2016 09:05    Procedures Procedures (including critical care time)  Medications Ordered in ED Medications - No data to display   Initial Impression / Assessment and Plan / ED Course  I  have reviewed the triage vital signs and the nursing notes.  Pertinent labs & imaging results that were available during my care of the patient were reviewed by me and considered in my medical decision making (see chart for details).  Clinical Course    Initially hypertensive SBP 160s, but well appearing. BP normalizes w/o intervention while in ED. In regards to episodic sob and chest discomfort, is low risk for ACS with heart score 3. Serial troponin and EKG w/o ischemia. CXR visualized and unremarkable. Presentation not concerning for PE, dissection or other serious intrathoracic or cardiopulmonary processes. She has follow-up in 1 week with PCP. She is stable for discharge with continued outpatient work up and management. Strict return and follow-up instructions reviewed. She expressed understanding of all discharge instructions and felt comfortable with the plan of care.   Final Clinical Impressions(s) / ED Diagnoses   Final diagnoses:  Nonspecific chest pain  Elevated blood  pressure reading    New Prescriptions New Prescriptions   No medications on file     Lavera Guise, MD 05/07/16 1322    Lavera Guise, MD 05/07/16 1323

## 2016-05-07 NOTE — ED Notes (Signed)
Patient ambulated to bathroom without difficulty.

## 2016-05-07 NOTE — Discharge Instructions (Signed)
Your heart work up has been reassuring here today. Please follow-up with your PCP next Tuesday as scheduled.  Please return without fail for worsening symptoms, including passing out, worsening pain, difficulty breathing or any other symptoms concerning to you.

## 2016-05-07 NOTE — ED Notes (Signed)
Patient transported to x-ray. ?

## 2016-05-13 DIAGNOSIS — G8929 Other chronic pain: Secondary | ICD-10-CM | POA: Diagnosis not present

## 2016-05-13 DIAGNOSIS — M519 Unspecified thoracic, thoracolumbar and lumbosacral intervertebral disc disorder: Secondary | ICD-10-CM | POA: Diagnosis not present

## 2016-05-13 DIAGNOSIS — G40909 Epilepsy, unspecified, not intractable, without status epilepticus: Secondary | ICD-10-CM | POA: Diagnosis not present

## 2016-05-13 DIAGNOSIS — M1990 Unspecified osteoarthritis, unspecified site: Secondary | ICD-10-CM | POA: Diagnosis not present

## 2016-05-13 DIAGNOSIS — Z6841 Body Mass Index (BMI) 40.0 and over, adult: Secondary | ICD-10-CM | POA: Diagnosis not present

## 2016-05-13 DIAGNOSIS — J45909 Unspecified asthma, uncomplicated: Secondary | ICD-10-CM | POA: Diagnosis not present

## 2016-05-21 ENCOUNTER — Other Ambulatory Visit: Payer: Self-pay | Admitting: Obstetrics & Gynecology

## 2016-05-21 ENCOUNTER — Other Ambulatory Visit (HOSPITAL_COMMUNITY)
Admission: RE | Admit: 2016-05-21 | Discharge: 2016-05-21 | Disposition: A | Discharge status: Home / Self Care | Payer: Managed Care, Other (non HMO) | Source: Ambulatory Visit | Attending: Obstetrics & Gynecology | Admitting: Obstetrics & Gynecology

## 2016-05-21 DIAGNOSIS — Z1151 Encounter for screening for human papillomavirus (HPV): Secondary | ICD-10-CM | POA: Diagnosis not present

## 2016-05-21 DIAGNOSIS — L298 Other pruritus: Secondary | ICD-10-CM | POA: Diagnosis not present

## 2016-05-21 DIAGNOSIS — Z6841 Body Mass Index (BMI) 40.0 and over, adult: Secondary | ICD-10-CM | POA: Diagnosis not present

## 2016-05-21 DIAGNOSIS — Z01419 Encounter for gynecological examination (general) (routine) without abnormal findings: Secondary | ICD-10-CM | POA: Diagnosis not present

## 2016-05-21 DIAGNOSIS — N939 Abnormal uterine and vaginal bleeding, unspecified: Secondary | ICD-10-CM | POA: Diagnosis not present

## 2016-05-21 DIAGNOSIS — R69 Illness, unspecified: Secondary | ICD-10-CM | POA: Diagnosis not present

## 2016-05-21 DIAGNOSIS — Z01411 Encounter for gynecological examination (general) (routine) with abnormal findings: Secondary | ICD-10-CM | POA: Diagnosis not present

## 2016-05-23 LAB — CYTOLOGY - PAP
DIAGNOSIS: NEGATIVE
HPV: NOT DETECTED

## 2016-06-03 DIAGNOSIS — M5416 Radiculopathy, lumbar region: Secondary | ICD-10-CM | POA: Diagnosis not present

## 2016-06-03 DIAGNOSIS — M549 Dorsalgia, unspecified: Secondary | ICD-10-CM | POA: Diagnosis not present

## 2016-06-12 ENCOUNTER — Encounter (HOSPITAL_BASED_OUTPATIENT_CLINIC_OR_DEPARTMENT_OTHER): Payer: Self-pay

## 2016-06-12 ENCOUNTER — Emergency Department (HOSPITAL_BASED_OUTPATIENT_CLINIC_OR_DEPARTMENT_OTHER)
Admission: EM | Admit: 2016-06-12 | Discharge: 2016-06-12 | Disposition: A | Discharge status: Home / Self Care | Payer: Managed Care, Other (non HMO) | Attending: Emergency Medicine | Admitting: Emergency Medicine

## 2016-06-12 DIAGNOSIS — Z87891 Personal history of nicotine dependence: Secondary | ICD-10-CM | POA: Insufficient documentation

## 2016-06-12 DIAGNOSIS — M62838 Other muscle spasm: Secondary | ICD-10-CM | POA: Diagnosis not present

## 2016-06-12 DIAGNOSIS — R52 Pain, unspecified: Secondary | ICD-10-CM | POA: Diagnosis not present

## 2016-06-12 DIAGNOSIS — Z79899 Other long term (current) drug therapy: Secondary | ICD-10-CM | POA: Diagnosis not present

## 2016-06-12 DIAGNOSIS — M5489 Other dorsalgia: Secondary | ICD-10-CM | POA: Diagnosis not present

## 2016-06-12 DIAGNOSIS — J45909 Unspecified asthma, uncomplicated: Secondary | ICD-10-CM | POA: Diagnosis not present

## 2016-06-12 MED ORDER — LIDOCAINE 5 % EX PTCH: 1.0000 | MEDICATED_PATCH | CUTANEOUS | 0 refills | Status: DC

## 2016-06-12 MED ORDER — KETOROLAC TROMETHAMINE 60 MG/2ML IM SOLN
60.0000 mg | Freq: Once | INTRAMUSCULAR | Status: AC
  Administered 2016-06-12: 60 mg via INTRAMUSCULAR
  Filled 2016-06-12: qty 2

## 2016-06-12 MED ORDER — DEXAMETHASONE SODIUM PHOSPHATE 10 MG/ML IJ SOLN
10.0000 mg | Freq: Once | INTRAMUSCULAR | Status: AC
  Administered 2016-06-12: 10 mg via INTRAMUSCULAR
  Filled 2016-06-12: qty 1

## 2016-06-12 NOTE — ED Triage Notes (Signed)
Per EMS pt was at work and developed mid to lower back pain, no injuries noted

## 2016-06-12 NOTE — ED Provider Notes (Addendum)
[facility]   CSN: [id number] Arrival date & time: 06/12/16  0431     History   Chief Complaint Chief Complaint  Patient presents with  . Back Pain    HPI Misty Foster is a 46 y.o. female.  The history is provided by the patient. No language interpreter was used.  Back Pain   This is a chronic problem. The current episode started more than 1 week ago. The problem occurs constantly. The problem has been rapidly worsening. The pain is associated with no known injury. Pain location: trapezius. The quality of the pain is described as stabbing. The pain does not radiate. The pain is severe. Exacerbated by: nothing. The pain is the same all the time. Pertinent negatives include no chest pain, no fever, no numbness, no weight loss, no headaches, no abdominal pain, no abdominal swelling, no bowel incontinence, no perianal numbness, no bladder incontinence, no dysuria, no pelvic pain, no leg pain, no paresthesias, no paresis, no tingling and no weakness. She has tried nothing for the symptoms. The treatment provided no relief. Risk factors include obesity.    Past Medical History:  Diagnosis Date  . Arthritis   . Asthma   . Back pain   . Chronic back pain   . Seizures (HCC)   . Tuberous sclerosis (HCC)     There are no active problems to display for this patient.   Past Surgical History:  Procedure Laterality Date  . ABLATION    . CESAREAN SECTION      OB History    No data available       Home Medications    Prior to Admission medications   Medication Sig Start Date End Date Taking? Authorizing Provider  albuterol (VENTOLIN HFA) 108 (90 Base) MCG/ACT inhaler Inhale 1 puff into the lungs every 4 (four) hours as needed for wheezing. 09/13/15   Historical Provider, MD  baclofen (LIORESAL) 10 MG tablet Take 20 mg by mouth daily. 05/03/16   Historical Provider, MD  diphenhydramine-acetaminophen (TYLENOL PM) 25-500 MG TABS tablet Take 1 tablet  by mouth at bedtime as needed (sleep).    Historical Provider, MD  PHENobarbital (LUMINAL) 97.2 MG tablet Take 1.5 tablets by mouth every evening. 04/30/16   Historical Provider, MD  triamcinolone cream (KENALOG) 0.1 % Apply 1 application topically 2 (two) times daily. Patient not taking: Reported on 05/07/2016 04/01/16   Arnetha Courser, MD    Family History No family history on file.  Social History Social History  Substance Use Topics  . Smoking status: Former Smoker    Types: Cigarettes  . Smokeless tobacco: Never Used  . Alcohol use No     Allergies   Lamictal [lamotrigine] and Hydrocodone-acetaminophen   Review of Systems Review of Systems  Constitutional: Negative for fever and weight loss.  Respiratory: Negative for shortness of breath.   Cardiovascular: Negative for chest pain, palpitations and leg swelling.  Gastrointestinal: Negative for abdominal pain and bowel incontinence.  Genitourinary: Negative for bladder incontinence, dysuria and pelvic pain.  Musculoskeletal: Positive for back pain. Negative for gait problem.  Neurological: Negative for tingling, weakness, numbness, headaches and paresthesias.  All other systems reviewed and are negative.    Physical Exam Updated Vital Signs BP 126/78   Pulse 87   Temp 98.1 F (36.7 C) (Oral)   Resp 18   Ht 5\' 4"  (1.626 m)   Wt (!) 342 lb (155.1 kg)   SpO2 100%   BMI 58.70  kg/m   Physical Exam  Constitutional: She is oriented to person, place, and time. She appears well-developed and well-nourished. No distress.  HENT:  Head: Normocephalic and atraumatic.  Mouth/Throat: No oropharyngeal exudate.  Eyes: EOM are normal. Pupils are equal, round, and reactive to light.  Neck: Normal range of motion. Neck supple.  Cardiovascular: Normal rate, regular rhythm and intact distal pulses.   Pulmonary/Chest: Effort normal and breath sounds normal. She has no wheezes. She has no rales.  Abdominal: Soft. Bowel sounds are  normal. She exhibits no mass. There is no tenderness. There is no rebound and no guarding.  Musculoskeletal: Normal range of motion. She exhibits no tenderness.  Spasm left trapezius  Neurological: She is alert and oriented to person, place, and time.  Skin: Skin is warm and dry. Capillary refill takes less than 2 seconds.  Psychiatric: She has a normal mood and affect.     ED Treatments / Results   Vitals:   06/12/16 0438  BP: 126/78  Pulse: 87  Resp: 18  Temp: 98.1 F (36.7 C)    Radiology No results found.  Procedures Procedures (including critical care time)  Medications Ordered in ED Medications  ketorolac (TORADOL) injection 60 mg (not administered)  dexamethasone (DECADRON) injection 10 mg (not administered)      Final Clinical Impressions(s) / ED Diagnoses  Chronic pain: wants to know why she has chronic pain and pain management has not been able to tell her or medicate her so she is pain free.  All questions answered to patient's satisfaction. Based on history and exam patient has been appropriately medically screened and emergency conditions excluded. Patient is stable for discharge at this time. Strict return precautions given for any further episodes, persistent fever, weakness or any concerns. New Prescriptions New Prescriptions   No medications on file     Misty Rosado, MD 06/12/16 0524    Misty Lober, MD 06/12/16 [phone]

## 2016-06-17 ENCOUNTER — Other Ambulatory Visit: Payer: Managed Care, Other (non HMO)

## 2016-06-17 ENCOUNTER — Ambulatory Visit (INDEPENDENT_AMBULATORY_CARE_PROVIDER_SITE_OTHER): Payer: Managed Care, Other (non HMO) | Admitting: Neurology

## 2016-06-17 ENCOUNTER — Encounter: Payer: Self-pay | Admitting: Neurology

## 2016-06-17 VITALS — BP 140/82 | HR 92 | Ht 64.0 in | Wt 340.1 lb

## 2016-06-17 DIAGNOSIS — G40109 Localization-related (focal) (partial) symptomatic epilepsy and epileptic syndromes with simple partial seizures, not intractable, without status epilepticus: Secondary | ICD-10-CM

## 2016-06-17 DIAGNOSIS — G629 Polyneuropathy, unspecified: Secondary | ICD-10-CM

## 2016-06-17 MED ORDER — PHENOBARBITAL 97.2 MG PO TABS: 145.8000 mg | ORAL_TABLET | Freq: Every evening | ORAL | 5 refills | Status: DC

## 2016-06-17 NOTE — Progress Notes (Signed)
NEUROLOGY CONSULTATION NOTE  Misty Foster MRN: [id number] DOB: 17-Jul-1970  Referring provider: Dr. Georgann Housekeeper Primary care provider: Dr. Georgann Housekeeper  Reason for consult:  seizures  Dear Dr Donette Larry:  Thank you for your kind referral of Misty Foster Gene for consultation of the above symptoms. Although her history is well known to you, please allow me to reiterate it for the purpose of our medical record. Records and images were personally reviewed where available.  HISTORY OF PRESENT ILLNESS: This is a pleasant 46 year old right-handed woman presenting to establish care for epilepsy. She started having seizures at age 37 that she described as focal, then had her first and only generalized tonic-clonic seizure at age 46. She had been living in [place] and seeing the [facility] [place], records were reviewed. 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 EEG in February 2013 reported as normal. She describes her focal seizures as starting with an aura where she sees herself across the room. Then either arm would start having a tremor, sometimes starting from the forearm and traveling up to her shoulder lasting a few seconds. She would have speech difficulties and feel diffusely weak after. She denies any confusion, stating she had an episode of confusion a long time ago that was attributed to an over the counter medication, she would wake up "disillusioned," not knowing where she was. She states Benadryl and Splenda caused issues where she woke up and could not see, feeling disoriented. This occurred before June 2017 (when she left [place]). She has been on Phenobarbital since the beginning ("I love my Phenobarbital"), she has tried different medications but would have reactions to all of them.  She recalls taking Dilantin, then Tegretol, which both worked great. She tried Lamictal, Gabapentin (dizzy/lightheaded), and Topamax (started losing vision), which did not help. She took Trileptal intermittently but it caused forgetfulness. She reports staring spells with some of her seizures, this is infrequent and has not been happening recently. She denies any olfactory/gustatory hallucinations, deja vu, rising epigastric sensation, focal numbness/tingling/weakness, myoclonic jerks. She has paresthesias in her hands and feet and states that blood tests have been done in the past. She has been on current dose of Phenobarbital for the past 3-4 years without side effects. She denies any headaches, dizziness, diplopia, bowel/bladder dysfunction. She has chronic pain in her neck down her spine, spasms across her abdomen, knee pain. She reports a history of cervical radiculopathy and right rotator cuff injury.   Epilepsy Risk Factors:  Her father had seizures when he was 47. She had bacterial meningitis in 1994. Otherwise she had a normal birth and early development.  There is no history of febrile convulsions, significant traumatic brain injury, neurosurgical procedures, or family history of seizures.  Prior AEDs: Dilantin, Tegretol, Trileptal, Lamictal, Gabapentin, Topamax  PAST MEDICAL HISTORY: Past Medical History:  Diagnosis Date  . Arthritis   . Asthma   . Back pain   . Chronic back pain   . Seizures (HCC)   . Tuberous sclerosis (HCC)     PAST SURGICAL HISTORY: Past Surgical History:  Procedure Laterality Date  . ABLATION    . CESAREAN SECTION      MEDICATIONS: Current Outpatient Prescriptions on File Prior to Visit  Medication Sig Dispense Refill  . albuterol (VENTOLIN HFA) 108 (90 Base) MCG/ACT inhaler Inhale 1 puff into  the lungs every 4 (four) hours as needed for wheezing.    . baclofen (LIORESAL) 10 MG tablet Take 20 mg by mouth daily.  4  . diphenhydramine-acetaminophen  (TYLENOL PM) 25-500 MG TABS tablet Take 1 tablet by mouth at bedtime as needed (sleep).    Marland Kitchen. PHENobarbital (LUMINAL) 97.2 MG tablet Take 1.5 tablets by mouth every evening.  0  . lidocaine (LIDODERM) 5 % Place 1 patch onto the skin daily. Remove & Discard patch within 12 hours or as directed by MD (Patient not taking: Reported on 06/17/2016) 10 patch 0  . triamcinolone cream (KENALOG) 0.1 % Apply 1 application topically 2 (two) times daily. (Patient not taking: Reported on 06/17/2016) 30 g 0   No current facility-administered medications on file prior to visit.     ALLERGIES: Allergies  Allergen Reactions  . Lamictal [Lamotrigine] Nausea And Vomiting  . Hydrocodone-Acetaminophen Other (See Comments)    Auras    FAMILY HISTORY: No family history on file.  SOCIAL HISTORY: Social History   Social History  . Marital status: Legally Separated    Spouse name: N/A  . Number of children: N/A  . Years of education: N/A   Occupational History  . Not on file.   Social History Main Topics  . Smoking status: Former Smoker    Types: Cigarettes  . Smokeless tobacco: Never Used  . Alcohol use No  . Drug use: No  . Sexual activity: Not on file   Other Topics Concern  . Not on file   Social History Narrative  . No narrative on file    REVIEW OF SYSTEMS: Constitutional: No fevers, chills, or sweats, no generalized fatigue, change in appetite Eyes: No visual changes, double vision, eye pain Ear, nose and throat: No hearing loss, ear pain, nasal congestion, sore throat Cardiovascular: No chest pain, palpitations Respiratory:  No shortness of breath at rest or with exertion, wheezes GastrointestinaI: No nausea, vomiting, diarrhea, abdominal pain, fecal incontinence Genitourinary:  No dysuria, urinary retention or frequency Musculoskeletal:  + neck pain, back pain Integumentary: No rash, pruritus, skin lesions Neurological: as above Psychiatric: No depression, insomnia,  anxiety Endocrine: No palpitations, fatigue, diaphoresis, mood swings, change in appetite, change in weight, increased thirst Hematologic/Lymphatic:  No anemia, purpura, petechiae. Allergic/Immunologic: no itchy/runny eyes, nasal congestion, recent allergic reactions, rashes  PHYSICAL EXAM: Vitals:   06/17/16 0904  BP: 140/82  Pulse: 92   General: No acute distress Head:  Normocephalic/atraumatic Eyes: Fundoscopic exam shows bilateral sharp discs, no vessel changes, exudates, or hemorrhages Neck: supple, no paraspinal tenderness, full range of motion Back: No paraspinal tenderness Heart: regular rate and rhythm Lungs: Clear to auscultation bilaterally. Vascular: No carotid bruits. Skin/Extremities: No rash, no edema Neurological Exam: Mental status: alert and oriented to person, place, and time, no dysarthria or aphasia, Fund of knowledge is appropriate.  Recent and remote memory are intact.  Attention and concentration are normal.    Able to name objects and repeat phrases. Cranial nerves: CN I: not tested CN II: pupils equal, round and reactive to light, visual fields intact, fundi unremarkable. CN III, IV, VI:  full range of motion, no nystagmus, no ptosis CN V: facial sensation intact CN VII: upper and lower face symmetric CN VIII: hearing intact to finger rub CN IX, X: gag intact, uvula midline CN XI: sternocleidomastoid and trapezius muscles intact CN XII: tongue midline Bulk & Tone: normal, no fasciculations. Motor: 5/5 throughout with no pronator drift. Sensation: decreased vibration to ankles bilaterally,  otherwise intact to light touch, cold, pin, and joint position sense.  No extinction to double simultaneous stimulation.  Romberg test negative Deep Tendon Reflexes: +1 throughout, no ankle clonus Plantar responses: downgoing bilaterally Cerebellar: no incoordination on finger to nose, heel to shin. No dysdiadochokinesia Gait: narrow-based and steady, able to tandem  walk adequately. Tremor: none  IMPRESSION: This is a pleasant 46 year old right-handed woman with a history of seizures since childhood. 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 EEG in February 2013 reported as normal. There is also note of non-epileptic events, however I do not find any records on this. She reports being stable on Phenobarbital 97.2mg  1.5 tabs daily. She had a confusional episode prior to June 2017 (when she moved to [facility]), and states she was reported to the [facility] and could not drive. No further events since then. We discussed effects of phenobarbital on bone health, check vitamin D level and bone density scan. A baseline Phenobarbital level will be done. She reports numbness and tingling in her hands and feet suggestive of neuropathy, and states workup has not been done in the past. EMG/NCV of the right UE and LE will be ordered to further evaluate her symptoms.  [place] driving laws were discussed with the patient, and she knows to stop driving after a seizure, until 6 months seizure-free. She will follow-up in 6 months and knows to call for any changes.   Thank you for allowing me to participate in the care of this patient. Please do not hesitate to call for any questions or concerns.   Patrcia Dolly, M.D.  CC: Dr. Donette Larry

## 2016-06-17 NOTE — Patient Instructions (Signed)
1. Bloodwork for Phenobarbital level, vitamin D level 2. Schedule bone density scan 3. Schedule EMG/NCV of right UE and LE 4. Continue Phenobarbital 97.2mg  1.5 tablets daily 5. Follow-up in 6 months, call for any changes  Seizure Precautions: 1. If medication has been prescribed for you to prevent seizures, take it exactly as directed.  Do not stop taking the medicine without talking to your doctor first, even if you have not had a seizure in a long time.   2. Avoid activities in which a seizure would cause danger to yourself or to others.  Don't operate dangerous machinery, swim alone, or climb in high or dangerous places, such as on ladders, roofs, or girders.  Do not drive unless your doctor says you may.  3. If you have any warning that you may have a seizure, lay down in a safe place where you can't hurt yourself.    4.  No driving for 6 months from last seizure, as per [facility] [place] law.   Please refer to the following link on the [employer]'s website for more information: http://www.epilepsyfoundation.org/answerplace/Social/driving/drivingu.cfm   5.  Maintain good sleep hygiene. Avoid alcohol  6.  Contact your doctor if you have any problems that may be related to the medicine you are taking.  7.  Call 911 and bring the patient back to the ED if:        A.  The seizure lasts longer than 5 minutes.       B.  The patient doesn't awaken shortly after the seizure  C.  The patient has new problems such as difficulty seeing, speaking or moving  D.  The patient was injured during the seizure  E.  The patient has a temperature over 102 F (39C)  F.  The patient vomited and now is having trouble breathing

## 2016-06-18 LAB — PHENOBARBITAL LEVEL: PHENOBARBITAL, SERUM: 14 ug/mL — AB (ref 15–40)

## 2016-06-18 LAB — VITAMIN D 25 HYDROXY (VIT D DEFICIENCY, FRACTURES): VIT D 25 HYDROXY: 17.8 ng/mL — AB (ref 30.0–100.0)

## 2016-06-19 ENCOUNTER — Encounter: Payer: Managed Care, Other (non HMO) | Admitting: Neurology

## 2016-06-19 ENCOUNTER — Telehealth: Payer: Self-pay

## 2016-06-19 DIAGNOSIS — Z029 Encounter for administrative examinations, unspecified: Secondary | ICD-10-CM

## 2016-06-19 MED ORDER — VITAMIN D (ERGOCALCIFEROL) 1.25 MG (50000 UNIT) PO CAPS: ORAL_CAPSULE | ORAL | 0 refills | Status: DC

## 2016-06-19 NOTE — Telephone Encounter (Signed)
Advised patient of below. She states she will try it but the last time she took Vitamin D she would have leg cramps. Encouraged patient to drink lots of water. Advised to call if any questions or concerns.

## 2016-06-19 NOTE — Telephone Encounter (Signed)
-----   Message from Van ClinesKaren M Aquino, MD sent at 06/18/2016  4:49 PM EST ----- Pls let her know the vitamin D level is very low, she needs to start replacement with high dose vitamin D. Take 50,000 IU once a week for 8 weeks, then start a daily OTC calcium with vitamin D. Thanks

## 2016-06-20 DIAGNOSIS — G40109 Localization-related (focal) (partial) symptomatic epilepsy and epileptic syndromes with simple partial seizures, not intractable, without status epilepticus: Secondary | ICD-10-CM | POA: Insufficient documentation

## 2016-06-20 DIAGNOSIS — G629 Polyneuropathy, unspecified: Secondary | ICD-10-CM | POA: Insufficient documentation

## 2016-06-24 DIAGNOSIS — G894 Chronic pain syndrome: Secondary | ICD-10-CM | POA: Diagnosis not present

## 2016-06-25 ENCOUNTER — Encounter: Payer: Self-pay | Admitting: Neurology

## 2016-06-26 ENCOUNTER — Telehealth: Payer: Self-pay | Admitting: Neurology

## 2016-06-26 ENCOUNTER — Ambulatory Visit (INDEPENDENT_AMBULATORY_CARE_PROVIDER_SITE_OTHER): Payer: Managed Care, Other (non HMO) | Admitting: Neurology

## 2016-06-26 DIAGNOSIS — R569 Unspecified convulsions: Secondary | ICD-10-CM

## 2016-06-26 DIAGNOSIS — Z79899 Other long term (current) drug therapy: Principal | ICD-10-CM

## 2016-06-26 DIAGNOSIS — G629 Polyneuropathy, unspecified: Secondary | ICD-10-CM

## 2016-06-26 NOTE — Telephone Encounter (Signed)
Misty Foster GeneJenkins Rodak [birth year]. She is in the office today having an EMG. She was following up about her Bone density test. She has not heard anything regarding it being set up. Her # is [phone]. Thank you

## 2016-06-26 NOTE — Telephone Encounter (Signed)
Patient called asking for the instructions of how to take her Vit D.  I told her one pill once a week for 8 weeks.  She said she had been taking them daily.  She only had a partial rx filled (4) and had taken them with the last being three days ago.  I told her not to take any more this week and I would send a note back for a nurse to call her about her future doses.

## 2016-06-26 NOTE — Telephone Encounter (Signed)
Will forward message to provider. 

## 2016-06-26 NOTE — Telephone Encounter (Addendum)
Per 06/17/16 ov - clld [facility] to order Bone Density Scan  Spoke to GarwinJennifer - Monday, 06/30/16 arrival at 1:10 pm for 1:30 pm scanning.  Pt was in the office  - gave lab results and appt for bone density scan..Marland Kitchen

## 2016-06-26 NOTE — Procedures (Signed)
[facility]  [address] [place], [address]  [place], [place] [address] Tel: [phone] Fax:  [phone] Test Date:  06/26/2016  Patient: Misty Foster DOB: 1970-08-23 Physician: Nita Sickle, DO  Sex: Female Height: 5\' 4"  Ref Phys: Patrcia Dolly, M.D.  ID#: [id number] Temp: 33.4C Technician: Judie Petit. Dean   Patient Complaints: This is a 46 year old female referred for evaluation of generalized paresthesias of the hands and feet.   NCV & EMG Findings: Extensive electrodiagnostic testing of the right upper and lower extremity shows:  1. Right median, ulnar, mixed palmer, sural, and superficial peroneal sensory responses are within normal limits. 2. Right tibial motor response shows mildly reduced amplitude with normal latency and conduction velocity. These findings may be technical in nature due to body habitus. Right median, ulnar, and peroneal motor responses are within normal limits. 3. There is no evidence of active or chronic motor axon loss changes affecting any of the tested muscles. Of note, needle electrode examination of proximal and deep muscles were unable to be sampled due to body habitus.  Impression: This is a normal study. In particular, there is no evidence of a large fiber sensorimotor polyneuropathy, carpal tunnel syndrome, or cervical/lumbosacral radiculopathy affecting the right side.    ___________________________ Nita Sickle, DO    Nerve Conduction Studies Anti Sensory Summary Table   Stim Site NR Peak (ms) Norm Peak (ms) P-T Amp (V) Norm P-T Amp  Right Median Anti Sensory (2nd Digit)  Wrist    2.8 <3.4 34.3 >20  Right Sup Peroneal Anti Sensory (Ant Lat Mall)  32.9C  12 cm    3.3 <4.5 5.3 >5  Right Sural Anti Sensory (Lat Mall)  32.9C  Calf    4.5 <4.5 9.5 >5  Right Ulnar Anti Sensory (5th Digit)  Wrist    3.1 <3.1 20.7 >12   Motor Summary Table   Stim Site NR Onset (ms) Norm Onset (ms) O-P Amp (mV) Norm O-P Amp Site1 Site2 Delta-0 (ms) Dist  (cm) Vel (m/s) Norm Vel (m/s)  Right Median Motor (Abd Poll Brev)  Wrist    3.0 <3.9 9.4 >6 Elbow Wrist 4.5 24.0 53 >50  Elbow    7.5  8.3         Right Peroneal Motor (Ext Dig Brev)  Ankle    4.4 <5.5 3.1 >3 B Fib Ankle 7.6 33.0 43 >40  B Fib    12.0  2.3  Poplt B Fib 1.7 10.0 59 >40  Poplt    13.7  2.1         Right Tibial Motor (Abd Hall Brev)  Ankle    3.8 <6.0 6.0 >8 Knee Ankle 8.7 39.0 45 >40  Knee    12.5  2.2         Right Ulnar Motor (Abd Dig Minimi)  Wrist    2.7 <3.1 8.5 >7 B Elbow Wrist 3.4 19.0 56 >50  B Elbow    6.1  8.5  A Elbow B Elbow 1.8 10.0 56 >50  A Elbow    7.9  8.2          Comparison Summary Table   Stim Site NR Peak (ms) Norm Peak (ms) P-T Amp (V) Site1 Site2 Delta-P (ms) Norm Delta (ms)  Right Median/Ulnar Palm Comparison (Wrist - 8cm)  Median Palm    1.8 <2.2 31.3 Median Palm Ulnar Palm 0.0   Ulnar Palm    1.8 <2.2 14.8       EMG  Side Muscle Ins Act Fibs Psw Fasc Number Recrt Dur Dur. Amp Amp. Poly Poly. Comment  Right 1stDorInt Nml Nml Nml Nml Nml Nml Nml Nml Nml Nml Nml Nml N/A  Right AntTibialis Nml Nml Nml Nml Nml Nml Nml Nml Nml Nml Nml Nml N/A  Right Gastroc Nml Nml Nml Nml Nml Nml Nml Nml Nml Nml Nml Nml N/A  Right RectFemoris Nml Nml Nml Nml Nml Nml Nml Nml Nml Nml Nml Nml N/A  Right BicepsFemS Nml Nml Nml Nml Nml Nml Nml Nml Nml Nml Nml Nml N/A  Right Ext Indicis Nml Nml Nml Nml Nml Nml Nml Nml Nml Nml Nml Nml N/A  Right PronatorTeres Nml Nml Nml Nml Nml Nml Nml Nml Nml Nml Nml Nml N/A  Right Biceps Nml Nml Nml Nml Nml Nml Nml Nml Nml Nml Nml Nml N/A  Right Triceps Nml Nml Nml Nml Nml Nml Nml Nml Nml Nml Nml Nml N/A  Right Deltoid Nml Nml Nml Nml Nml Nml Nml Nml Nml Nml Nml Nml N/A      Waveforms:

## 2016-06-27 NOTE — Telephone Encounter (Signed)
Clld pt - advsd of provider's notation. Pt stated she understood.

## 2016-06-27 NOTE — Telephone Encounter (Signed)
Pls let her know to restart the vitamin D next week, then take the remaining 4 pills once a week.   Also, please let her know the nerve test was normal, her nerves look healthy. Thanks

## 2016-06-30 ENCOUNTER — Ambulatory Visit
Admission: RE | Admit: 2016-06-30 | Discharge: 2016-06-30 | Disposition: A | Discharge status: Home / Self Care | Payer: Managed Care, Other (non HMO) | Source: Ambulatory Visit | Attending: Neurology | Admitting: Neurology

## 2016-06-30 DIAGNOSIS — Z13820 Encounter for screening for osteoporosis: Secondary | ICD-10-CM | POA: Diagnosis not present

## 2016-06-30 DIAGNOSIS — Z780 Asymptomatic menopausal state: Secondary | ICD-10-CM | POA: Diagnosis not present

## 2016-06-30 DIAGNOSIS — G40109 Localization-related (focal) (partial) symptomatic epilepsy and epileptic syndromes with simple partial seizures, not intractable, without status epilepticus: Secondary | ICD-10-CM

## 2016-07-01 ENCOUNTER — Telehealth: Payer: Self-pay

## 2016-07-01 NOTE — Telephone Encounter (Signed)
Spoke to patient. Gave results. Patient verbalized understanding. 

## 2016-07-01 NOTE — Telephone Encounter (Signed)
-----   Message from Van ClinesKaren M Aquino, MD sent at 07/01/2016  1:19 PM EST ----- Pls let her know bone density scan is normal, thanks

## 2016-07-09 DIAGNOSIS — Z0000 Encounter for general adult medical examination without abnormal findings: Secondary | ICD-10-CM | POA: Diagnosis not present

## 2016-07-09 DIAGNOSIS — M1990 Unspecified osteoarthritis, unspecified site: Secondary | ICD-10-CM | POA: Diagnosis not present

## 2016-07-09 DIAGNOSIS — M519 Unspecified thoracic, thoracolumbar and lumbosacral intervertebral disc disorder: Secondary | ICD-10-CM | POA: Diagnosis not present

## 2016-07-09 DIAGNOSIS — M25571 Pain in right ankle and joints of right foot: Secondary | ICD-10-CM | POA: Diagnosis not present

## 2016-07-09 DIAGNOSIS — M25531 Pain in right wrist: Secondary | ICD-10-CM | POA: Diagnosis not present

## 2016-07-09 DIAGNOSIS — J45909 Unspecified asthma, uncomplicated: Secondary | ICD-10-CM | POA: Diagnosis not present

## 2016-07-09 DIAGNOSIS — Z1389 Encounter for screening for other disorder: Secondary | ICD-10-CM | POA: Diagnosis not present

## 2016-07-09 DIAGNOSIS — G40909 Epilepsy, unspecified, not intractable, without status epilepticus: Secondary | ICD-10-CM | POA: Diagnosis not present

## 2016-07-09 DIAGNOSIS — G8929 Other chronic pain: Secondary | ICD-10-CM | POA: Diagnosis not present

## 2016-07-22 ENCOUNTER — Encounter (HOSPITAL_BASED_OUTPATIENT_CLINIC_OR_DEPARTMENT_OTHER): Payer: Self-pay | Admitting: Emergency Medicine

## 2016-07-22 ENCOUNTER — Emergency Department (HOSPITAL_BASED_OUTPATIENT_CLINIC_OR_DEPARTMENT_OTHER)
Admission: EM | Admit: 2016-07-22 | Discharge: 2016-07-22 | Disposition: A | Discharge status: Home / Self Care | Payer: Managed Care, Other (non HMO) | Attending: Emergency Medicine | Admitting: Emergency Medicine

## 2016-07-22 DIAGNOSIS — M62830 Muscle spasm of back: Secondary | ICD-10-CM

## 2016-07-22 DIAGNOSIS — Z87891 Personal history of nicotine dependence: Secondary | ICD-10-CM | POA: Insufficient documentation

## 2016-07-22 DIAGNOSIS — Z79899 Other long term (current) drug therapy: Secondary | ICD-10-CM | POA: Insufficient documentation

## 2016-07-22 DIAGNOSIS — J45909 Unspecified asthma, uncomplicated: Secondary | ICD-10-CM | POA: Diagnosis not present

## 2016-07-22 MED ORDER — DEXAMETHASONE SODIUM PHOSPHATE 10 MG/ML IJ SOLN
10.0000 mg | Freq: Once | INTRAMUSCULAR | Status: AC
  Administered 2016-07-22: 10 mg via INTRAMUSCULAR
  Filled 2016-07-22: qty 1

## 2016-07-22 MED ORDER — KETOROLAC TROMETHAMINE 60 MG/2ML IM SOLN
60.0000 mg | Freq: Once | INTRAMUSCULAR | Status: AC
  Administered 2016-07-22: 60 mg via INTRAMUSCULAR
  Filled 2016-07-22: qty 2

## 2016-07-22 MED ORDER — LIDOCAINE 5 % EX PTCH: 1.0000 | MEDICATED_PATCH | CUTANEOUS | 0 refills | Status: DC

## 2016-07-22 NOTE — ED Triage Notes (Signed)
Patient states that she has chronic spasms to her left shoulder and her back. The patient states that this episode started last night ans has continued

## 2016-07-22 NOTE — ED Provider Notes (Signed)
[facility] Provider Note: Lowella DellJ. Lane Ajooni Karam, MD, FACEP  CSN: [id number] MRN: [id number] ARRIVAL: 07/22/16 at 0514 ROOM: MH10/MH10   CHIEF COMPLAINT  Spasms   HISTORY OF PRESENT ILLNESS  Misty Foster is a 46 y.o. female with a history of seizures and muscle spasms the back. She is here with an acute exacerbation of left upper back muscle spasm that began at work yesterday evening about 10 PM. There was no acute triggering injury. She rates the pain is moderate to severe. It is worse with movement of the upper back as well as extension of the left shoulder. She was seen here previously for this and states she got significant relief with IM dexamethasone and Toradol. She was discharged home on lidocaine patches of which she requests a refill. She was already on baclofen. She is also on phenobarbital for seizures.   Past Medical History:  Diagnosis Date  . Arthritis   . Asthma   . Back pain   . Chronic back pain   . Seizures (HCC)   . 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 ABLATION    . CESAREAN SECTION      History reviewed. No pertinent family history.  Social History  Substance Use Topics  . Smoking status: Former Smoker    Types: Cigarettes  . Smokeless tobacco: Never Used  . Alcohol use No    Prior to Admission medications   Medication Sig Start Date End Date Taking? Authorizing Provider  albuterol (VENTOLIN HFA) 108 (90 Base) MCG/ACT inhaler Inhale 1 puff into the lungs every 4 (four) hours as needed for wheezing. 09/13/15   Historical Provider, MD  baclofen (LIORESAL) 10 MG tablet Take 20 mg by mouth daily. 05/03/16   Historical Provider, MD  diphenhydramine-acetaminophen (TYLENOL PM) 25-500 MG TABS tablet Take 1 tablet by mouth at bedtime as needed (sleep).    Historical Provider, MD  lidocaine (LIDODERM) 5 % Place 1 patch onto the skin daily. Remove & Discard patch within 12 hours or as directed by  MD Patient not taking: Reported on 06/17/2016 06/12/16   April Palumbo, MD  PHENobarbital (LUMINAL) 97.2 MG tablet Take 1.5 tablets (145.8 mg total) by mouth every evening. 06/17/16   Van ClinesKaren M Aquino, MD  triamcinolone cream (KENALOG) 0.1 % Apply 1 application topically 2 (two) times daily. Patient not taking: Reported on 06/17/2016 04/01/16   Arnetha CourserSumayya Amin, MD  Vitamin D, Ergocalciferol, (DRISDOL) 50000 units CAPS capsule Take one capsule once a week for 8 weeks. 06/19/16   Van ClinesKaren M Aquino, MD    Allergies Lamictal [lamotrigine] and Hydrocodone-acetaminophen   REVIEW OF SYSTEMS  Negative except as noted here or in the History of Present Illness.   PHYSICAL EXAMINATION  Initial Vital Signs Blood pressure (!) 166/125, pulse (!) 118, temperature 97.8 F (36.6 C), temperature source Oral, resp. rate (!) 26, height 5\' 4"  (1.626 m), weight (!) 340 lb (154.2 kg), SpO2 100 %.  Examination General: Well-developed, obese female in no acute distress; appearance consistent with age of record HENT: normocephalic; atraumatic Eyes: Normal appearance Neck: supple Heart: regular rate and rhythm Lungs: clear to auscultation bilaterally Abdomen: soft; nondistended; nontender; bowel sounds present Back: Palpable, tender spasm of the left trapezius Extremities: No deformity; full range of motion except left shoulder limited in extension Neurologic: Awake, alert and oriented; motor function intact in all extremities and symmetric; no facial droop Skin: Warm and dry Psychiatric: Anxious   RESULTS  Summary of this visit's results, reviewed by myself:   EKG Interpretation  Date/Time:    Ventricular Rate:    PR Interval:    QRS Duration:   QT Interval:    QTC Calculation:   R Axis:     Text Interpretation:        Laboratory Studies: No results found for this or any previous visit (from the past 24 hour(s)). Imaging Studies: No results found.  ED COURSE  Nursing notes and initial vitals signs,  including pulse oximetry, reviewed.  Vitals:   07/22/16 0521 07/22/16 0524  BP: (!) 166/125   Pulse: (!) 118   Resp: (!) 26   Temp:  97.8 F (36.6 C)  TempSrc:  Oral  SpO2: 100%   Weight: (!) 340 lb (154.2 kg)   Height:  5\' 4"  (1.626 m)    PROCEDURES    ED DIAGNOSES     ICD-9-CM ICD-10-CM   1. Muscle spasm of back 724.8 M62.830        Paula Libra, MD 07/22/16 [phone]

## 2016-07-24 DIAGNOSIS — M549 Dorsalgia, unspecified: Secondary | ICD-10-CM | POA: Diagnosis not present

## 2016-07-24 DIAGNOSIS — M519 Unspecified thoracic, thoracolumbar and lumbosacral intervertebral disc disorder: Secondary | ICD-10-CM | POA: Diagnosis not present

## 2016-07-24 DIAGNOSIS — J029 Acute pharyngitis, unspecified: Secondary | ICD-10-CM | POA: Diagnosis not present

## 2016-07-29 ENCOUNTER — Ambulatory Visit: Payer: BLUE CROSS/BLUE SHIELD | Admitting: Neurology

## 2016-07-31 ENCOUNTER — Other Ambulatory Visit: Payer: Self-pay | Admitting: Sports Medicine

## 2016-07-31 DIAGNOSIS — M545 Low back pain: Secondary | ICD-10-CM | POA: Diagnosis not present

## 2016-07-31 DIAGNOSIS — S139XXA Sprain of joints and ligaments of unspecified parts of neck, initial encounter: Secondary | ICD-10-CM | POA: Diagnosis not present

## 2016-07-31 DIAGNOSIS — M549 Dorsalgia, unspecified: Secondary | ICD-10-CM

## 2016-07-31 DIAGNOSIS — M5106 Intervertebral disc disorders with myelopathy, lumbar region: Secondary | ICD-10-CM | POA: Diagnosis not present

## 2016-08-14 ENCOUNTER — Inpatient Hospital Stay
Admission: RE | Admit: 2016-08-14 | Discharge: 2016-08-14 | Disposition: A | Discharge status: Home / Self Care | Payer: Managed Care, Other (non HMO) | Source: Ambulatory Visit | Attending: Sports Medicine | Admitting: Sports Medicine

## 2016-08-25 DIAGNOSIS — R69 Illness, unspecified: Secondary | ICD-10-CM | POA: Diagnosis not present

## 2016-08-25 DIAGNOSIS — F4320 Adjustment disorder, unspecified: Secondary | ICD-10-CM | POA: Diagnosis not present

## 2016-08-30 ENCOUNTER — Emergency Department (HOSPITAL_COMMUNITY)
Admission: EM | Admit: 2016-08-30 | Discharge: 2016-08-30 | Disposition: A | Discharge status: Home / Self Care | Payer: Managed Care, Other (non HMO) | Attending: Emergency Medicine | Admitting: Emergency Medicine

## 2016-08-30 DIAGNOSIS — M545 Low back pain, unspecified: Secondary | ICD-10-CM

## 2016-08-30 DIAGNOSIS — G8929 Other chronic pain: Secondary | ICD-10-CM | POA: Diagnosis not present

## 2016-08-30 DIAGNOSIS — J45909 Unspecified asthma, uncomplicated: Secondary | ICD-10-CM | POA: Insufficient documentation

## 2016-08-30 DIAGNOSIS — Z79899 Other long term (current) drug therapy: Secondary | ICD-10-CM | POA: Insufficient documentation

## 2016-08-30 DIAGNOSIS — Z87891 Personal history of nicotine dependence: Secondary | ICD-10-CM | POA: Insufficient documentation

## 2016-08-30 MED ORDER — KETOROLAC TROMETHAMINE 60 MG/2ML IM SOLN
60.0000 mg | Freq: Once | INTRAMUSCULAR | Status: AC
  Administered 2016-08-30: 60 mg via INTRAMUSCULAR
  Filled 2016-08-30: qty 2

## 2016-08-30 MED ORDER — DEXAMETHASONE SODIUM PHOSPHATE 10 MG/ML IJ SOLN
10.0000 mg | Freq: Once | INTRAMUSCULAR | Status: AC
  Administered 2016-08-30: 10 mg via INTRAMUSCULAR
  Filled 2016-08-30: qty 1

## 2016-08-30 NOTE — ED Provider Notes (Signed)
[facility] Provider Note   CSN: [id number] Arrival date & time: 08/30/16  1109     History   Chief Complaint Chief Complaint  Patient presents with  . Back Pain    HPI Misty Foster is a 46 y.o. female with a history of chronic low back pain, seizures, tuberous sclerosis, obesity.  She presents today for evaluation of "cracking" of the bones in her lower back.  Patient reports that over the past 2 weeks she has been feeling the bones in her lower back crack.  This cracking is associated with pain and has become more frequent/painful over the past 2 weeks.  The pain is midline lower lumbar, consistent with the patient's normal pain.  She denies any new numbness/tingling. No changes in bowel/bladder function or numbness/tingling to genitals or upper inner thighs.  She denies any specific injury or event that may have exacerbated her pain.  She reports that last June she moved to the area and that she has had to find a pain specialist/orthopedist in the area who she likes well enough to follow up with.   HPI  Past Medical History:  Diagnosis Date  . Arthritis   . Asthma   . Back pain   . Chronic back pain   . Seizures (HCC)   . Tuberous sclerosis [facility])     Patient Active Problem List   Diagnosis Date Noted  . 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 Neuropathy 06/20/2016    Past Surgical History:  Procedure Laterality Date  . ABLATION    . CESAREAN SECTION      OB History    No data available       Home Medications    Prior to Admission medications   Medication Sig Start Date End Date Taking? Authorizing Provider  albuterol (VENTOLIN HFA) 108 (90 Base) MCG/ACT inhaler Inhale 1 puff into the lungs every 4 (four) hours as needed for wheezing. 09/13/15  Yes [provider]  baclofen (LIORESAL) 10 MG tablet Take 10-20 mg by mouth  daily.  05/03/16  Yes [provider]  diphenhydramine-acetaminophen (TYLENOL PM) 25-500 MG TABS tablet Take 1 tablet by mouth at bedtime as needed (sleep).   Yes [provider]  lidocaine (LIDODERM) 5 % Place 1 patch onto the skin daily. Remove & Discard patch within 12 hours or as directed by MD 07/22/16  Yes Molpus, Jonny Ruiz, MD  PHENobarbital (LUMINAL) 97.2 MG tablet Take 1.5 tablets (145.8 mg total) by mouth every evening. 06/17/16  Yes Van Clines, MD  traMADol (ULTRAM) 50 MG tablet Take 50 mg by mouth every 6 (six) hours as needed.   Yes [provider]  Vitamin D, Ergocalciferol, (DRISDOL) 50000 units CAPS capsule Take one capsule once a week for 8 weeks. Patient not taking: Reported on 08/30/2016 06/19/16   Van Clines, MD    Family History No family history on file.  Social History Social History  Substance Use Topics  . Smoking status: Former Smoker    Types: Cigarettes  . Smokeless tobacco: Never Used  . Alcohol use No     Allergies   Lamictal [lamotrigine] and Hydrocodone-acetaminophen   Review of Systems Review of Systems  Constitutional: Negative for chills, fatigue and fever.  HENT: Negative for ear pain and sore throat.   Eyes: Negative for visual disturbance.  Respiratory: Negative for cough and shortness of breath.   Cardiovascular: Negative for  chest pain and palpitations.  Gastrointestinal: Negative for abdominal pain and vomiting.  Genitourinary: Negative for decreased urine volume, difficulty urinating, dysuria, hematuria and urgency.  Musculoskeletal: Positive for back pain. Negative for arthralgias, gait problem and neck pain.  Skin: Negative for color change and rash.  Neurological: Negative for syncope, weakness and numbness.  All other systems reviewed and are negative.    Physical Exam Updated Vital Signs BP 132/76   Pulse 66   Temp 98.6 F (37 C) (Oral)   Resp 16   Ht 5\' 4"  (1.626 m)   Wt (!) 158.8 kg   LMP  06/30/2016 (Approximate)   SpO2 100%   BMI 60.08 kg/m   Physical Exam  Constitutional: She appears well-developed and well-nourished. No distress.  HENT:  Head: Normocephalic and atraumatic.  Eyes: Conjunctivae are normal. No scleral icterus.  Neck: Normal range of motion. Neck supple.  Cardiovascular: Normal rate, regular rhythm and normal heart sounds.   No murmur heard. Pulmonary/Chest: Effort normal and breath sounds normal. No stridor. No respiratory distress. She has no wheezes.  Abdominal: Soft. Bowel sounds are normal. She exhibits no distension.  Musculoskeletal: She exhibits no edema or deformity.       Cervical back: Normal.       Thoracic back: Normal.       Lumbar back: She exhibits tenderness, bony tenderness, pain and spasm. She exhibits normal range of motion, no edema, no deformity and no laceration.  Sensory and motor function intact to bilateral lower extremities. No abscess on low back.   Neurological: She is alert. No sensory deficit. She exhibits normal muscle tone.  Skin: Skin is warm and dry. She is not diaphoretic.  Psychiatric: She has a normal mood and affect. Her behavior is normal.  Nursing note and vitals reviewed.    ED Treatments / Results  Labs (all labs ordered are listed, but only abnormal results are displayed) Labs Reviewed - No data to display  EKG  EKG Interpretation None       Radiology No results found.  Procedures Procedures (including critical care time)  Medications Ordered in ED Medications  dexamethasone (DECADRON) injection 10 mg (not administered)  ketorolac (TORADOL) injection 60 mg (not administered)     Initial Impression / Assessment and Plan / ED Course  I have reviewed the triage vital signs and the nursing notes.  Pertinent labs & imaging results that were available during my care of the patient were reviewed by me and considered in my medical decision making (see chart for details).  Patient with back  pain.  No neurological deficits and normal neuro exam.  Patient can walk but states is painful.  No loss of bowel or bladder control.  No concern for cauda equina.  No fever, night sweats, weight loss, h/o cancer, IVDU.  RICE protocol and pain medicine indicated and discussed with patient.   I have reviewed records of multiple ED visits with similar or other pain related complaints, usually with negative workups. I feel that the patient's pain is chronic and cannot be appropriately or safely treated in an emergency department setting.   The patient was given the option of lumbar x-rays/imaging but refused them, even after being told that medical advise was to have x-rays and that the lack of x-rays would limit my evaluation of her back pain and may involve "missing" of a diagnosis that could potentially be serious and cause disability.   I do not feel that providing narcotic pain medication is  in this patient's best interest. I have urged the patient to have close follow up with their provider or pain specialist and have provided the adequate resources for this. I have explicitly discussed with the patient return precautions and have reassured patient that they can always be seen and evaluated in the emergency department for any condition that they feel is emergent, and that they will be given treatment as the EDP feels is appropriate and safe, but this may not involve the use of narcotic pain medications. The patient was given the opportunity to voice any further questions or concerns and these were addressed to the best of my ability.   This patient was discussed with Dr. Donnald GarrePfeiffer who agreed with my plan.    Final Clinical Impressions(s) / ED Diagnoses   Final diagnoses:  Chronic midline low back pain without sciatica    New Prescriptions New Prescriptions   No medications on file     Norman ClayHammond, Nina Mondor W, PA-C 08/30/16 1433    Arby BarrettePfeiffer, Marcy, MD 08/31/16 [phone]

## 2016-08-30 NOTE — ED Notes (Signed)
Nurse called to pts room.  Pt advises she does not want any xrays done. PA made aware.

## 2016-08-30 NOTE — ED Triage Notes (Signed)
Pt presents with 2 week h/o low back pain, pt reports chronic back pain, denies any recent injury, reports this pain is different in that with movement, she feels her bones "crack".   Pt denies any bowel or bladder incontinence.

## 2016-09-01 DIAGNOSIS — R69 Illness, unspecified: Secondary | ICD-10-CM | POA: Diagnosis not present

## 2016-09-01 DIAGNOSIS — F4320 Adjustment disorder, unspecified: Secondary | ICD-10-CM | POA: Diagnosis not present

## 2016-09-05 ENCOUNTER — Other Ambulatory Visit: Payer: Self-pay | Admitting: Obstetrics & Gynecology

## 2016-09-05 DIAGNOSIS — G8929 Other chronic pain: Secondary | ICD-10-CM | POA: Diagnosis not present

## 2016-09-05 DIAGNOSIS — N898 Other specified noninflammatory disorders of vagina: Secondary | ICD-10-CM | POA: Diagnosis not present

## 2016-09-05 DIAGNOSIS — R69 Illness, unspecified: Secondary | ICD-10-CM | POA: Diagnosis not present

## 2016-09-05 DIAGNOSIS — R102 Pelvic and perineal pain: Secondary | ICD-10-CM | POA: Diagnosis not present

## 2016-09-05 DIAGNOSIS — Z6841 Body Mass Index (BMI) 40.0 and over, adult: Secondary | ICD-10-CM | POA: Diagnosis not present

## 2016-09-08 DIAGNOSIS — F4320 Adjustment disorder, unspecified: Secondary | ICD-10-CM | POA: Diagnosis not present

## 2016-09-08 DIAGNOSIS — R69 Illness, unspecified: Secondary | ICD-10-CM | POA: Diagnosis not present

## 2016-09-15 ENCOUNTER — Ambulatory Visit
Admission: RE | Admit: 2016-09-15 | Discharge: 2016-09-15 | Disposition: A | Discharge status: Home / Self Care | Payer: Managed Care, Other (non HMO) | Source: Ambulatory Visit | Attending: Sports Medicine | Admitting: Sports Medicine

## 2016-09-15 DIAGNOSIS — M549 Dorsalgia, unspecified: Secondary | ICD-10-CM

## 2016-09-15 DIAGNOSIS — M5126 Other intervertebral disc displacement, lumbar region: Secondary | ICD-10-CM | POA: Diagnosis not present

## 2016-09-16 DIAGNOSIS — R69 Illness, unspecified: Secondary | ICD-10-CM | POA: Diagnosis not present

## 2016-09-16 DIAGNOSIS — F4320 Adjustment disorder, unspecified: Secondary | ICD-10-CM | POA: Diagnosis not present

## 2016-09-17 ENCOUNTER — Ambulatory Visit
Admission: RE | Admit: 2016-09-17 | Discharge: 2016-09-17 | Disposition: A | Discharge status: Home / Self Care | Payer: Managed Care, Other (non HMO) | Source: Ambulatory Visit | Attending: Obstetrics & Gynecology | Admitting: Obstetrics & Gynecology

## 2016-09-17 DIAGNOSIS — R102 Pelvic and perineal pain: Secondary | ICD-10-CM

## 2016-09-17 DIAGNOSIS — N83201 Unspecified ovarian cyst, right side: Secondary | ICD-10-CM | POA: Diagnosis not present

## 2016-09-18 DIAGNOSIS — M549 Dorsalgia, unspecified: Secondary | ICD-10-CM | POA: Diagnosis not present

## 2016-09-18 DIAGNOSIS — Z6841 Body Mass Index (BMI) 40.0 and over, adult: Secondary | ICD-10-CM | POA: Diagnosis not present

## 2016-09-18 DIAGNOSIS — M5116 Intervertebral disc disorders with radiculopathy, lumbar region: Secondary | ICD-10-CM | POA: Diagnosis not present

## 2016-09-23 DIAGNOSIS — R69 Illness, unspecified: Secondary | ICD-10-CM | POA: Diagnosis not present

## 2016-09-23 DIAGNOSIS — F4320 Adjustment disorder, unspecified: Secondary | ICD-10-CM | POA: Diagnosis not present

## 2016-09-29 DIAGNOSIS — F4320 Adjustment disorder, unspecified: Secondary | ICD-10-CM | POA: Diagnosis not present

## 2016-09-29 DIAGNOSIS — R69 Illness, unspecified: Secondary | ICD-10-CM | POA: Diagnosis not present

## 2016-10-03 ENCOUNTER — Encounter (HOSPITAL_COMMUNITY): Payer: Self-pay | Admitting: Emergency Medicine

## 2016-10-03 ENCOUNTER — Emergency Department (HOSPITAL_COMMUNITY)
Admission: EM | Admit: 2016-10-03 | Discharge: 2016-10-03 | Disposition: A | Discharge status: Home / Self Care | Payer: Managed Care, Other (non HMO) | Attending: Emergency Medicine | Admitting: Emergency Medicine

## 2016-10-03 DIAGNOSIS — J45909 Unspecified asthma, uncomplicated: Secondary | ICD-10-CM | POA: Insufficient documentation

## 2016-10-03 DIAGNOSIS — B9689 Other specified bacterial agents as the cause of diseases classified elsewhere: Secondary | ICD-10-CM

## 2016-10-03 DIAGNOSIS — N760 Acute vaginitis: Secondary | ICD-10-CM | POA: Insufficient documentation

## 2016-10-03 DIAGNOSIS — Z79899 Other long term (current) drug therapy: Secondary | ICD-10-CM | POA: Diagnosis not present

## 2016-10-03 DIAGNOSIS — Z87891 Personal history of nicotine dependence: Secondary | ICD-10-CM | POA: Diagnosis not present

## 2016-10-03 DIAGNOSIS — G40009 Localization-related (focal) (partial) idiopathic epilepsy and epileptic syndromes with seizures of localized onset, not intractable, without status epilepticus: Secondary | ICD-10-CM | POA: Insufficient documentation

## 2016-10-03 DIAGNOSIS — N899 Noninflammatory disorder of vagina, unspecified: Secondary | ICD-10-CM | POA: Diagnosis present

## 2016-10-03 LAB — WET PREP, GENITAL
Clue Cells Wet Prep HPF POC: NONE SEEN
Sperm: NONE SEEN
Trich, Wet Prep: NONE SEEN
Yeast Wet Prep HPF POC: NONE SEEN

## 2016-10-03 LAB — I-STAT BETA HCG BLOOD, ED (MC, WL, AP ONLY): I-stat hCG, quantitative: <5 m[IU]/mL (ref <5)

## 2016-10-03 LAB — HIV ANTIBODY (ROUTINE TESTING W REFLEX): HIV Screen 4th Generation wRfx: NONREACTIVE

## 2016-10-03 LAB — RPR: RPR Ser Ql: NONREACTIVE

## 2016-10-03 LAB — GC/CHLAMYDIA PROBE AMP (~~LOC~~) NOT AT ARMC
CHLAMYDIA, DNA PROBE: NEGATIVE
NEISSERIA GONORRHEA: NEGATIVE

## 2016-10-03 MED ORDER — METRONIDAZOLE 500 MG PO TABS: 500.0000 mg | ORAL_TABLET | Freq: Two times a day (BID) | ORAL | 0 refills | Status: DC

## 2016-10-03 NOTE — ED Provider Notes (Signed)
[facility] Provider Note   CSN: [id number] Arrival date & time: 10/03/16  4098     History   Chief Complaint Chief Complaint  Patient presents with  . Vaginal Itching    HPI Misty Foster is a 46 y.o. female.  HPI Patient presents to the emergency room with complaints of vaginal itching. Patient states she was diagnosed with bacterial vaginosis the last couple of weeks and was treated with antibiotics. Her symptoms improved. She has had intercourse since and following that she started having vaginal itching and discomfort again. She denies any abdominal pain. She denies any fevers or chills. She denies any dysuria. She came to the emergency room because of the discomfort associated with itching Past Medical History:  Diagnosis Date  . Arthritis   . Asthma   . Back pain   . Chronic back pain   . Seizures (HCC)   . Tuberous sclerosis [facility])     Patient Active Problem List   Diagnosis Date Noted  . 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 Neuropathy 06/20/2016    Past Surgical History:  Procedure Laterality Date  . ABLATION    . CESAREAN SECTION      OB History    No data available       Home Medications    Prior to Admission medications   Medication Sig Start Date End Date Taking? Authorizing Provider  albuterol (VENTOLIN HFA) 108 (90 Base) MCG/ACT inhaler Inhale 1 puff into the lungs every 4 (four) hours as needed for wheezing. 09/13/15   [provider]  baclofen (LIORESAL) 10 MG tablet Take 10-20 mg by mouth daily.  05/03/16   [provider]  diphenhydramine-acetaminophen (TYLENOL PM) 25-500 MG TABS tablet Take 1 tablet by mouth at bedtime as needed (sleep).    [provider]  lidocaine (LIDODERM) 5 % Place 1 patch onto the skin daily. Remove & Discard patch within 12 hours or as directed by MD 07/22/16    Molpus, Jonny Ruiz, MD  metroNIDAZOLE (FLAGYL) 500 MG tablet Take 1 tablet (500 mg total) by mouth 2 (two) times daily. 10/03/16   Linwood Dibbles, MD  PHENobarbital (LUMINAL) 97.2 MG tablet Take 1.5 tablets (145.8 mg total) by mouth every evening. 06/17/16   Van Clines, MD  traMADol (ULTRAM) 50 MG tablet Take 50 mg by mouth every 6 (six) hours as needed.    [provider]  Vitamin D, Ergocalciferol, (DRISDOL) 50000 units CAPS capsule Take one capsule once a week for 8 weeks. Patient not taking: Reported on 08/30/2016 06/19/16   Van Clines, MD    Family History History reviewed. No pertinent family history.  Social History Social History  Substance Use Topics  . Smoking status: Former Smoker    Types: Cigarettes  . Smokeless tobacco: Never Used  . Alcohol use No     Allergies   Lamictal [lamotrigine] and Hydrocodone-acetaminophen   Review of Systems Review of Systems  All other systems reviewed and are negative.    Physical Exam Updated Vital Signs BP 126/80   Pulse 81   Temp 97.8 F (36.6 C) (Oral)   Resp 20   SpO2 100%   Physical Exam  Constitutional: She appears well-developed and well-nourished. No distress.  Morbidly obese   HENT:  Head: Normocephalic and atraumatic.  Right Ear: External ear normal.  Left Ear: External ear normal.  Eyes: Conjunctivae are normal. Right eye exhibits  no discharge. Left eye exhibits no discharge. No scleral icterus.  Neck: Neck supple. No tracheal deviation present.  Cardiovascular: Normal rate.   Pulmonary/Chest: Effort normal. No stridor. No respiratory distress.  Abdominal: Soft. She exhibits no distension and no mass. There is no tenderness. There is no rebound and no guarding.  Genitourinary: There is no rash or lesion on the right labia. There is no rash or lesion on the left labia. Uterus is not tender. Cervix exhibits discharge. Cervix exhibits no motion tenderness. Right adnexum displays no mass and no tenderness.  Left adnexum displays no mass and no tenderness. Vaginal discharge (brown) found.  Musculoskeletal: She exhibits no edema.  Neurological: She is alert. Cranial nerve deficit: no gross deficits.  Skin: Skin is warm and dry. No rash noted. She is not diaphoretic.  Psychiatric: She has a normal mood and affect.  Nursing note and vitals reviewed.    ED Treatments / Results  Labs (all labs ordered are listed, but only abnormal results are displayed) Labs Reviewed  WET PREP, GENITAL - Abnormal; Notable for the following:       Result Value   WBC, Wet Prep HPF POC MANY (*)    All other components within normal limits  RPR  HIV ANTIBODY (ROUTINE TESTING)  I-STAT BETA HCG BLOOD, ED ([facility], [facility], [facility] ONLY)  GC/CHLAMYDIA PROBE AMP ([place]) NOT AT [facility]   Procedures Procedures (including critical care time)  Medications Ordered in ED Medications - No data to display   Initial Impression / Assessment and Plan / ED Course  I have reviewed the triage vital signs and the nursing notes.  Pertinent labs & imaging results that were available during my care of the patient were reviewed by me and considered in my medical decision making (see chart for details).   patient presents to the emergency room with complaints of vaginal itching. She does have brown discharge on pelvic exam. Laboratory tests sent off for possible STD. We'll treat for vaginitis/bacterial vaginosis with Flagyl. Follow up with OB/GYN  Final Clinical Impressions(s) / ED Diagnoses   Final diagnoses:  Acute vaginitis  BV (bacterial vaginosis)    New Prescriptions New Prescriptions   METRONIDAZOLE (FLAGYL) 500 MG TABLET    Take 1 tablet (500 mg total) by mouth 2 (two) times daily.     Linwood DibblesKnapp, Marki Frede, MD 10/03/16 [phone]

## 2016-10-03 NOTE — Discharge Instructions (Signed)
Take the medications as prescribed, follow-up with your OB/GYN doctor if the symptoms do not improve or return

## 2016-10-03 NOTE — ED Triage Notes (Signed)
Pt sts vaginal itching and irritation; pt sts hx of BV and sts feels same

## 2016-10-03 NOTE — ED Notes (Signed)
Pt dressed waiting for d/c. Pt given drink and crackers.

## 2016-10-13 DIAGNOSIS — F4320 Adjustment disorder, unspecified: Secondary | ICD-10-CM | POA: Diagnosis not present

## 2016-10-13 DIAGNOSIS — R69 Illness, unspecified: Secondary | ICD-10-CM | POA: Diagnosis not present

## 2016-10-16 DIAGNOSIS — M542 Cervicalgia: Secondary | ICD-10-CM | POA: Diagnosis not present

## 2016-10-16 DIAGNOSIS — M549 Dorsalgia, unspecified: Secondary | ICD-10-CM | POA: Diagnosis not present

## 2016-10-16 DIAGNOSIS — M5116 Intervertebral disc disorders with radiculopathy, lumbar region: Secondary | ICD-10-CM | POA: Diagnosis not present

## 2016-10-20 DIAGNOSIS — F4320 Adjustment disorder, unspecified: Secondary | ICD-10-CM | POA: Diagnosis not present

## 2016-10-20 DIAGNOSIS — R69 Illness, unspecified: Secondary | ICD-10-CM | POA: Diagnosis not present

## 2016-10-22 DIAGNOSIS — M5116 Intervertebral disc disorders with radiculopathy, lumbar region: Secondary | ICD-10-CM | POA: Diagnosis not present

## 2016-10-22 DIAGNOSIS — M542 Cervicalgia: Secondary | ICD-10-CM | POA: Diagnosis not present

## 2016-10-22 DIAGNOSIS — M549 Dorsalgia, unspecified: Secondary | ICD-10-CM | POA: Diagnosis not present

## 2016-11-10 ENCOUNTER — Ambulatory Visit (INDEPENDENT_AMBULATORY_CARE_PROVIDER_SITE_OTHER): Payer: Managed Care, Other (non HMO) | Admitting: Orthopaedic Surgery

## 2016-11-10 ENCOUNTER — Ambulatory Visit (INDEPENDENT_AMBULATORY_CARE_PROVIDER_SITE_OTHER): Payer: Managed Care, Other (non HMO)

## 2016-11-10 DIAGNOSIS — M25551 Pain in right hip: Secondary | ICD-10-CM | POA: Diagnosis not present

## 2016-11-10 DIAGNOSIS — M5431 Sciatica, right side: Secondary | ICD-10-CM

## 2016-11-10 DIAGNOSIS — G8929 Other chronic pain: Secondary | ICD-10-CM

## 2016-11-10 DIAGNOSIS — M545 Low back pain, unspecified: Secondary | ICD-10-CM

## 2016-11-10 MED ORDER — METHYLPREDNISOLONE ACETATE 40 MG/ML IJ SUSP
40.0000 mg | INTRAMUSCULAR | Status: AC | PRN
  Administered 2016-11-10: 40 mg via INTRAMUSCULAR

## 2016-11-10 MED ORDER — LIDOCAINE HCL 1 % IJ SOLN
1.0000 mL | INTRAMUSCULAR | Status: AC | PRN
  Administered 2016-11-10: 1 mL

## 2016-11-10 NOTE — Progress Notes (Signed)
Office Visit Note   Patient: Misty Foster           Date of Birth: 06/21/1970           MRN: [id number] Visit Date: 11/10/2016              Requested by: Georgann HousekeeperHusain, Karrar, MD [address] [place], [place] [address] PCP: Georgann HousekeeperHusain, Karrar, MD   Assessment & Plan: Visit Diagnoses:  1. Pain in right hip   2. Chronic right-sided low back pain without sciatica     Plan: I do not feel this is a hip issue based on her clinical exam and her plain films. I did want to try trigger point injection which she hurts in the lowest aspect of her lumbar spine and upper pelvis area just in the muscle. She was agreeable to this. She'll continue physical therapy and I do feel to get I did try cane in her opposite hand for short period time. I like see her back in 4 weeks to see how she doing overall. All questions were encouraged and answered.  Follow-Up Instructions: Return in about 4 weeks (around 12/08/2016).   Orders:  Orders Placed This Encounter  Procedures  . Trigger Point Injection  . XR HIP UNILAT W OR W/O PELVIS 2-3 VIEWS RIGHT   No orders of the defined types were placed in this encounter.     Procedures: Trigger Point Inj Date/Time: 11/10/2016 10:19 AM Performed by: Kathryne HitchBLACKMAN, CHRISTOPHER Y Authorized by: Kathryne HitchBLACKMAN, CHRISTOPHER Y   Indications:  Pain Total # of Trigger Points:  1 Location: back   Medications #1:  1 mL lidocaine 1 %; 40 mg methylPREDNISolone acetate 40 MG/ML     Clinical Data: No additional findings.   Subjective: Chief Complaint  Patient presents with  . Hip Pain    right worse than left hip pain for months-NKI  The patient is a very pleasant 46 year old female sent to me to evaluate right sided hip and low back pain. She actually has an MRI of her back on the canopy system for me to review. She's been in physical therapy for a few weeks now. 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 She is trying  to be active with this pain has become aggravating her last year. He tried anti-inflammatories and muscle relaxant and does report some groin pain. However the office she says her pain is all at her back and her backside were she points to. She denies a change in bowel or bladder function. Denies any numbness and tingling in her feet. It was suggested she may try a cane in her opposite hand to offload her right side for a while. Of note she is a morbidly obese individual.  HPI  Review of Systems She denies any chest pain, headache, shortness of breath, fever, chills, nausea, vomiting.  Objective: Vital Signs: There were no vitals taken for this visit.  Physical Exam She is alert and oriented 3 and in no acute distress Ortho Exam Examination of her right hip shows fluid internal/external rotation with the hip and no blocks to motion at all. There is no pain over the trochanteric area either. All of her pain seems to be in the posterior muscles of the back and hip area but not the hip proper. Specialty Comments:  No specialty comments available.  Imaging: Xr Hip Unilat W Or W/o Pelvis 2-3 Views Right  Result Date: 11/10/2016  An AP pelvis and a lateral of her right hip shows no acute findings. There still is very well maintained joint space between the femoral head and the acetabulum with no significant arthritic findings.  I also independently review the MRI of her lumbar spine. There is some small disc bulges at L4-L5 and L5-S1 but no central or foraminal stenosis or nerve impingement. There is some slight arthritic changes in the facet joints of the lower aspect of the spine.  PMFS History: Patient Active Problem List   Diagnosis Date Noted  . Pain in right hip 11/10/2016  . Chronic right-sided low back pain without sciatica 11/10/2016  . 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 Neuropathy 06/20/2016   Past Medical History:  Diagnosis Date  . Arthritis   . Asthma   . Back pain   . Chronic back pain   . Seizures (HCC)   . Tuberous sclerosis (HCC)     No family history on file.  Past Surgical History:  Procedure Laterality Date  . ABLATION    . CESAREAN SECTION     Social History   Occupational History  . Not on file.   Social History Main Topics  . Smoking status: Former Smoker    Types: Cigarettes  . Smokeless tobacco: Never Used  . Alcohol use No  . Drug use: No  . Sexual activity: Not on file

## 2016-11-24 DIAGNOSIS — F4320 Adjustment disorder, unspecified: Secondary | ICD-10-CM | POA: Diagnosis not present

## 2016-11-24 DIAGNOSIS — R69 Illness, unspecified: Secondary | ICD-10-CM | POA: Diagnosis not present

## 2016-11-25 DIAGNOSIS — Z6841 Body Mass Index (BMI) 40.0 and over, adult: Secondary | ICD-10-CM | POA: Diagnosis not present

## 2016-11-25 DIAGNOSIS — M519 Unspecified thoracic, thoracolumbar and lumbosacral intervertebral disc disorder: Secondary | ICD-10-CM | POA: Diagnosis not present

## 2016-11-25 DIAGNOSIS — M1990 Unspecified osteoarthritis, unspecified site: Secondary | ICD-10-CM | POA: Diagnosis not present

## 2016-11-25 DIAGNOSIS — G40909 Epilepsy, unspecified, not intractable, without status epilepticus: Secondary | ICD-10-CM | POA: Diagnosis not present

## 2016-11-25 DIAGNOSIS — G8929 Other chronic pain: Secondary | ICD-10-CM | POA: Diagnosis not present

## 2016-12-02 ENCOUNTER — Telehealth (INDEPENDENT_AMBULATORY_CARE_PROVIDER_SITE_OTHER): Payer: Self-pay | Admitting: Orthopaedic Surgery

## 2016-12-02 NOTE — Telephone Encounter (Signed)
Patient called needing copy of records to pickup today. I verified her information. Copy of records ready at front desk to pickup

## 2016-12-09 ENCOUNTER — Ambulatory Visit (INDEPENDENT_AMBULATORY_CARE_PROVIDER_SITE_OTHER): Payer: Managed Care, Other (non HMO) | Admitting: Physician Assistant

## 2016-12-09 DIAGNOSIS — M545 Low back pain, unspecified: Secondary | ICD-10-CM

## 2016-12-09 DIAGNOSIS — G8929 Other chronic pain: Secondary | ICD-10-CM

## 2016-12-09 NOTE — Progress Notes (Signed)
Office Visit Note   Patient: Misty Foster           Date of Birth: 1971/01/21           MRN: [id number] Visit Date: 12/09/2016              Requested by: Georgann HousekeeperHusain, Karrar, MD [address] [place], [place] [address] PCP: Georgann HousekeeperHusain, Karrar, MD   Assessment & Plan: Visit Diagnoses:  1. Chronic right-sided low back pain without sciatica     Plan: Discussed with her using a TENS unit low back. She states she has used TENS unit in the past on her shoulder did not really like it but she'll try the TENS unit. She has a TENS unit at home. Also discussed with her water therapy. The uses a recumbent bike was also discussed with her. Weight loss discussed.She has Lidoderm patches which I would continue to use as needed low back hip region. She has an upcoming bariatric surgery consultation. I discussed with her that if it she could lose some weight , it definitelywould help her back and hip pain. If she develops any radicular symptoms down the leg her condition becomes worse she'll contact us otherwise follow up as needed.  Follow-Up Instructions: Return if symptoms worsen or fail to improve.   Orders:  No orders of the defined types were placed in this encounter.  No orders of the defined types were placed in this encounter.     Procedures: No procedures performed   Clinical Data: No additional findings.   Subjective: Right hip pain  HPI Misty Foster is 46 year old female returns today follow-up of her right hip and back pain status post trigger point injection. States that was incredible for about a week and a half and pain slowly returned. No pins back to the level was prior to the shot. She did go to physical therapy and a home exercise program and to reproduce this home exercise program at the gym. She has an upcoming bariatric surgery consultation. Review of Systems Denies any radicular symptoms down either leg. Positive for right buttocks pain.  Objective: Vital  Signs: There were no vitals taken for this visit.  Physical Exam  Constitutional: She is oriented to person, place, and time. She appears well-developed and well-nourished. No distress.  Pulmonary/Chest: Effort normal.  Neurological: She is alert and oriented to person, place, and time.  Skin: She is not diaphoretic.  Psychiatric: She has a normal mood and affect. Her behavior is normal.    Ortho Exam Bilateral hip she has good range of motion of both hips. Favors is negative bilaterally. She has tenderness in the lower lumbar spinal palpation slight tenderness over the right trochanteric region. Specialty Comments:  No specialty comments available.  Imaging: No results found.   PMFS History: Patient Active Problem List   Diagnosis Date Noted  . Pain in right hip 11/10/2016  . Chronic right-sided low back pain without sciatica 11/10/2016  . 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 Neuropathy 06/20/2016   Past Medical History:  Diagnosis Date  . Arthritis   . Asthma   . Back pain   . Chronic back pain   . Seizures (HCC)   . Tuberous sclerosis (HCC)     No family history on file.  Past Surgical History:  Procedure Laterality Date  . ABLATION    . CESAREAN SECTION     Social  History   Occupational History  . Not on file.   Social History Main Topics  . Smoking status: Former Smoker    Types: Cigarettes  . Smokeless tobacco: Never Used  . Alcohol use No  . Drug use: No  . Sexual activity: Not on file

## 2016-12-15 ENCOUNTER — Ambulatory Visit: Payer: Managed Care, Other (non HMO) | Admitting: Neurology

## 2016-12-17 ENCOUNTER — Other Ambulatory Visit: Payer: Self-pay

## 2016-12-18 ENCOUNTER — Telehealth: Payer: Self-pay | Admitting: Neurology

## 2016-12-18 ENCOUNTER — Other Ambulatory Visit: Payer: Self-pay

## 2016-12-18 DIAGNOSIS — G40109 Localization-related (focal) (partial) symptomatic epilepsy and epileptic syndromes with simple partial seizures, not intractable, without status epilepticus: Secondary | ICD-10-CM

## 2016-12-18 MED ORDER — PHENOBARBITAL 97.2 MG PO TABS: 145.8000 mg | ORAL_TABLET | Freq: Every evening | ORAL | 5 refills | Status: DC

## 2016-12-18 NOTE — Telephone Encounter (Signed)
Thank you :)

## 2016-12-18 NOTE — Telephone Encounter (Signed)
Rx written, waiting for provider signature.    Misty Foster - if she calls back, please let her know that Rx will be faxed today

## 2016-12-18 NOTE — Telephone Encounter (Signed)
This patient called needing to have a refill on her Phenobarbital medication. Please Advise. Thanks

## 2016-12-24 ENCOUNTER — Encounter (HOSPITAL_BASED_OUTPATIENT_CLINIC_OR_DEPARTMENT_OTHER): Payer: Self-pay

## 2016-12-24 ENCOUNTER — Emergency Department (HOSPITAL_BASED_OUTPATIENT_CLINIC_OR_DEPARTMENT_OTHER)
Admission: EM | Admit: 2016-12-24 | Discharge: 2016-12-25 | Disposition: A | Discharge status: Home / Self Care | Payer: Managed Care, Other (non HMO) | Attending: Emergency Medicine | Admitting: Emergency Medicine

## 2016-12-24 DIAGNOSIS — S46912A Strain of unspecified muscle, fascia and tendon at shoulder and upper arm level, left arm, initial encounter: Secondary | ICD-10-CM

## 2016-12-24 DIAGNOSIS — Y929 Unspecified place or not applicable: Secondary | ICD-10-CM | POA: Insufficient documentation

## 2016-12-24 DIAGNOSIS — M25512 Pain in left shoulder: Secondary | ICD-10-CM | POA: Diagnosis present

## 2016-12-24 DIAGNOSIS — J45909 Unspecified asthma, uncomplicated: Secondary | ICD-10-CM | POA: Diagnosis not present

## 2016-12-24 DIAGNOSIS — S46012A Strain of muscle(s) and tendon(s) of the rotator cuff of left shoulder, initial encounter: Secondary | ICD-10-CM | POA: Insufficient documentation

## 2016-12-24 DIAGNOSIS — Z87891 Personal history of nicotine dependence: Secondary | ICD-10-CM | POA: Diagnosis not present

## 2016-12-24 DIAGNOSIS — Y999 Unspecified external cause status: Secondary | ICD-10-CM | POA: Diagnosis not present

## 2016-12-24 DIAGNOSIS — Z79899 Other long term (current) drug therapy: Secondary | ICD-10-CM | POA: Insufficient documentation

## 2016-12-24 DIAGNOSIS — Y939 Activity, unspecified: Secondary | ICD-10-CM | POA: Insufficient documentation

## 2016-12-24 DIAGNOSIS — X58XXXA Exposure to other specified factors, initial encounter: Secondary | ICD-10-CM | POA: Insufficient documentation

## 2016-12-24 NOTE — ED Triage Notes (Signed)
Pt c/o chronic lower back and lt shoulder pain [id number], denies injury

## 2016-12-24 NOTE — ED Notes (Signed)
Pt reports having a torn rotator cuff to her right arm and expresses this is very similar. Pt uses her arms frequently at work.

## 2016-12-25 NOTE — ED Provider Notes (Signed)
[facility]   CSN: [id number] Arrival date & time: 12/24/16  2322     History   Chief Complaint Chief Complaint  Patient presents with  . Shoulder Pain    HPI Misty Foster is a 46 y.o. female.  The history is provided by the patient.  Shoulder Pain   This is a chronic problem. The current episode started more than 1 week ago. The problem occurs daily. The problem has been gradually worsening. The pain is present in the left shoulder. The pain is moderate. Associated symptoms include stiffness. Pertinent negatives include no numbness. The symptoms are aggravated by activity (movement/work). She has tried OTC pain medications for the symptoms. The treatment provided no relief.  pt reports h/o left shoulder pain for months No trauma She recently had massage to left scapula and pain intensified She does repetitive arm movement at work which seems to worsen pain  Past Medical History:  Diagnosis Date  . Arthritis   . Asthma   . Back pain   . Chronic back pain   . Seizures (HCC)   . Tuberous sclerosis [facility])     Patient Active Problem List   Diagnosis Date Noted  . Pain in right hip 11/10/2016  . Chronic right-sided low back pain without sciatica 11/10/2016  . 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 Neuropathy 06/20/2016    Past Surgical History:  Procedure Laterality Date  . ABLATION    . CESAREAN SECTION      OB History    No data available       Home Medications    Prior to Admission medications   Medication Sig Start Date End Date Taking? Authorizing Provider  acetaminophen-codeine (TYLENOL #3) 300-30 MG tablet  12/08/16   [provider]  albuterol (VENTOLIN HFA) 108 (90 Base) MCG/ACT inhaler Inhale 1 puff into the lungs every 4 (four) hours as needed for wheezing. 09/13/15   [provider]  baclofen (LIORESAL) 10 MG tablet Take 10-20 mg by mouth daily.  05/03/16   [provider]  baclofen (LIORESAL) 10 MG tablet TAKE 2 TABLETS BY MOUTH AT NIGHT 11/17/16   [provider]  diphenhydramine-acetaminophen (TYLENOL PM) 25-500 MG TABS tablet Take 1 tablet by mouth at bedtime as needed (sleep).    [provider]  lidocaine (LIDODERM) 5 % Place 1 patch onto the skin daily. Remove & Discard patch within 12 hours or as directed by MD 07/22/16   Molpus, Jonny Ruiz, MD  metroNIDAZOLE (FLAGYL) 500 MG tablet Take 1 tablet (500 mg total) by mouth 2 (two) times daily. 10/03/16   Linwood Dibbles, MD  PHENobarbital (LUMINAL) 97.2 MG tablet Take 1.5 tablets (145.8 mg total) by mouth every evening. 12/18/16   Van Clines, MD  Vitamin D, Ergocalciferol, (DRISDOL) 50000 units CAPS capsule Take one capsule once a week for 8 weeks. Patient not taking: Reported on 08/30/2016 06/19/16   Van Clines, MD    Family History No family history on file.  Social History Social History  Substance Use Topics  . Smoking status: Former Smoker    Types: Cigarettes  . Smokeless tobacco: Never Used  . Alcohol use No     Allergies   Lamictal [lamotrigine]; Hydrocodone-acetaminophen; and Gabapentin   Review of Systems Review of Systems  Respiratory: Negative for shortness of breath.   Cardiovascular: Negative for chest pain.  Musculoskeletal: Positive for arthralgias  and stiffness. Negative for back pain.  Neurological: Negative for weakness and numbness.     Physical Exam Updated Vital Signs BP 105/79   Pulse 60   Temp 98.3 F (36.8 C) (Oral)   Resp 18   Ht 1.626 m (5\' 4" )   Wt (!) 154.2 kg (340 lb)   LMP 11/25/2016   SpO2 99%   BMI 58.36 kg/m   Physical Exam CONSTITUTIONAL: Well developed/well nourished HEAD: Normocephalic/atraumatic EYES: EOMI  ENMT: Mucous membranes moist NECK: supple no meningeal signs SPINE/BACK:entire spine nontender LUNGS:  no apparent  distress ABDOMEN: soft, obese GU:no cva tenderness NEURO: Pt is awake/alert/appropriate, moves all extremitiesx4.  No facial droop.   EXTREMITIES: pulses normal/equal in arms, full ROM.  Tenderness to palpation of left shoulder.  No deformities She can raise the left arm to the horizontal but limited due to pain.   SKIN: warm, color normal PSYCH: no abnormalities of mood noted, alert and oriented to situation   ED Treatments / Results  Labs (all labs ordered are listed, but only abnormal results are displayed) Labs Reviewed - No data to display  EKG  EKG Interpretation None       Radiology No results found.  Procedures Procedures (including critical care time)  Medications Ordered in ED Medications - No data to display   Initial Impression / Assessment and Plan / ED Course  I have reviewed the triage vital signs and the nursing notes.   Pt with worsening chronic Left shoulder pain Sling for comfort (advised to range left UE each day) Referred to sports med She is already on APAP#3 and baclofen, defer new meds at this time   Final Clinical Impressions(s) / ED Diagnoses   Final diagnoses:  Strain of left shoulder, initial encounter    New Prescriptions Discharge Medication List as of 12/25/2016  1:19 AM       Zadie RhineWickline, Brock Larmon, MD 12/25/16 [phone]

## 2016-12-31 ENCOUNTER — Encounter: Payer: Self-pay | Admitting: Family Medicine

## 2016-12-31 ENCOUNTER — Ambulatory Visit (INDEPENDENT_AMBULATORY_CARE_PROVIDER_SITE_OTHER): Payer: Managed Care, Other (non HMO) | Admitting: Family Medicine

## 2016-12-31 VITALS — BP 123/82 | HR 78 | Ht 65.0 in | Wt 340.0 lb

## 2016-12-31 DIAGNOSIS — M25512 Pain in left shoulder: Secondary | ICD-10-CM

## 2016-12-31 MED ORDER — METHYLPREDNISOLONE ACETATE 40 MG/ML IJ SUSP
40.0000 mg | Freq: Once | INTRAMUSCULAR | Status: AC
  Administered 2016-12-31: 40 mg via INTRA_ARTICULAR

## 2016-12-31 NOTE — Patient Instructions (Signed)
You have rotator cuff impingement Try to avoid painful activities (overhead activities, lifting with extended arm) as much as possible. Aleve 2 tabs twice a day with food OR ibuprofen 3 tabs three times a day with food for pain and inflammation. Continue your tylenol #3, baclofen. Subacromial injection may be beneficial to help with pain and to decrease inflammation - you were given this today Consider physical therapy with transition to home exercise program. Do home exercise program with theraband and scapular stabilization exercises daily 3 sets of 10 once a day. If not improving at follow-up we will consider further imaging, physical therapy, and/or nitro patches. Follow up with me in 1 month.

## 2017-01-01 DIAGNOSIS — M25512 Pain in left shoulder: Secondary | ICD-10-CM | POA: Insufficient documentation

## 2017-01-01 NOTE — Assessment & Plan Note (Signed)
2/2 rotator cuff impingement.  Start with aleve or ibuprofen.  Continue tylenol #3, baclofen.  Given subacromial injection today.  Home exercises reviewed.  F/u in 1 month.  Consider imaging, PT, nitro patches if not improving as expected.  After informed written consent timeout was performed, patient was seated on exam table. Left shoulder was prepped with alcohol swab and utilizing posterior approach, patient's left subacromial space was injected with 3:1 bupivicaine: depomedrol. Patient tolerated the procedure well without immediate complications.

## 2017-01-01 NOTE — Progress Notes (Signed)
PCP: Husain, Karrar, MD  Subjective:   HPI: Patient is a 46 y.o. female here for left shoulder pain.  Patient reports she started to get pain in lateral left shoulder a few months ago but really worsened past week. She started to get some pain into here with swimming, trouble lifting the arm normally. Pain is 4/10 now but up to 10/10 and sharp at worst. Cannot lie on this - worse at night. No skin changes.  Gets some numbness just yesterday in left ring, pinky fingers. No neck pain currently. Takes tylenol #3 and baclofen for her back and hip.  Past Medical History:  Diagnosis Date  . Arthritis   . Asthma   . Back pain   . Chronic back pain   . Seizures (HCC)   . Tuberous sclerosis [facility])     Current Outpatient Prescriptions on File Prior to Visit  Medication Sig Dispense Refill  . acetaminophen-codeine (TYLENOL #3) 300-30 MG tablet     . albuterol (VENTOLIN HFA) 108 (90 Base) MCG/ACT inhaler Inhale 1 puff into the lungs every 4 (four) hours as needed for wheezing.    . baclofen (LIORESAL) 10 MG tablet Take 10-20 mg by mouth daily.   4  . baclofen (LIORESAL) 10 MG tablet TAKE 2 TABLETS BY MOUTH AT NIGHT    . diphenhydramine-acetaminophen (TYLENOL PM) 25-500 MG TABS tablet Take 1 tablet by mouth at bedtime as needed (sleep).    Marland Kitchen lidocaine (LIDODERM) 5 % Place 1 patch onto the skin daily. Remove & Discard patch within 12 hours or as directed by MD 15 patch 0  . metroNIDAZOLE (FLAGYL) 500 MG tablet Take 1 tablet (500 mg total) by mouth 2 (two) times daily. 14 tablet 0  . PHENobarbital (LUMINAL) 97.2 MG tablet Take 1.5 tablets (145.8 mg total) by mouth every evening. 45 tablet 5  . Vitamin D, Ergocalciferol, (DRISDOL) 50000 units CAPS capsule Take one capsule once a week for 8 weeks. (Patient not taking: Reported on 08/30/2016) 8 capsule 0   No current facility-administered medications on file prior to visit.     Past Surgical History:  Procedure Laterality Date  . ABLATION    .  CESAREAN SECTION      Allergies  Allergen Reactions  . Lamictal [Lamotrigine] Nausea And Vomiting  . Hydrocodone-Acetaminophen Other (See Comments)    Auras  . Gabapentin     Social History   Social History  . Marital status: Legally Separated    Spouse name: N/A  . Number of children: N/A  . Years of education: N/A   Occupational History  . Not on file.   Social History Main Topics  . Smoking status: Former Smoker    Types: Cigarettes  . Smokeless tobacco: Never Used  . Alcohol use No  . Drug use: No  . Sexual activity: Not on file   Other Topics Concern  . Not on file   Social History Narrative  . No narrative on file    No family history on file.  BP 123/82   Pulse 78   Ht  (1.651 m)   Wt (!) 340 lb (154.2 kg)   BMI 56.58 kg/m   Review of Systems: See HPI above.     Objective:  Physical Exam:  Gen: NAD, comfortable in exam room  Left shoulder: No swelling, ecchymoses.  No gross deformity. No TTP. FROM with painful arc. Positive Hawkins, Neers. Negative Yergasons. Strength 5/5 with eGeorgann Housekeeperd resisted internal/external rotation.  Pain  empty can. Negative apprehension. NV intact distally.  Right shoulder: No swelling, ecchymoses.  No gross deformity. No TTP. FROM. Strength 5/5 with empty can and resisted internal/external rotation. NV intact distally.   Assessment & Plan:  1. Left shoulder pain - 2/2 rotator cuff impingement.  Start with aleve or ibuprofen.  Continue tylenol #3, baclofen.  Given subacromial injection today.  Home exercises reviewed.  F/u in 1 month.  Consider imaging, PT, nitro patches if not improving as expected.  After informed written consent timeout was performed, patient was seated on exam table. Left shoulder was prepped with alcohol swab and utilizing posterior approach, patient's left subacromial space was injected with 3:1 bupivicaine: depomedrol. Patient tolerated the procedure well without immediate  complications.

## 2017-01-02 ENCOUNTER — Ambulatory Visit: Payer: Managed Care, Other (non HMO) | Admitting: Family Medicine

## 2017-01-16 ENCOUNTER — Other Ambulatory Visit: Payer: Self-pay | Admitting: Internal Medicine

## 2017-01-16 DIAGNOSIS — Z1231 Encounter for screening mammogram for malignant neoplasm of breast: Secondary | ICD-10-CM

## 2017-01-20 ENCOUNTER — Emergency Department (HOSPITAL_BASED_OUTPATIENT_CLINIC_OR_DEPARTMENT_OTHER)
Admission: EM | Admit: 2017-01-20 | Discharge: 2017-01-20 | Disposition: A | Discharge status: Home / Self Care | Payer: Managed Care, Other (non HMO) | Attending: Emergency Medicine | Admitting: Emergency Medicine

## 2017-01-20 ENCOUNTER — Encounter (HOSPITAL_BASED_OUTPATIENT_CLINIC_OR_DEPARTMENT_OTHER): Payer: Self-pay

## 2017-01-20 DIAGNOSIS — N898 Other specified noninflammatory disorders of vagina: Secondary | ICD-10-CM | POA: Diagnosis not present

## 2017-01-20 DIAGNOSIS — Z87891 Personal history of nicotine dependence: Secondary | ICD-10-CM | POA: Insufficient documentation

## 2017-01-20 DIAGNOSIS — J45909 Unspecified asthma, uncomplicated: Secondary | ICD-10-CM | POA: Insufficient documentation

## 2017-01-20 DIAGNOSIS — R102 Pelvic and perineal pain: Secondary | ICD-10-CM

## 2017-01-20 LAB — PREGNANCY, URINE: Preg Test, Ur: NEGATIVE

## 2017-01-20 LAB — URINALYSIS, ROUTINE W REFLEX MICROSCOPIC
BILIRUBIN URINE: NEGATIVE
GLUCOSE, UA: NEGATIVE mg/dL
HGB URINE DIPSTICK: NEGATIVE
KETONES UR: NEGATIVE mg/dL
Leukocytes, UA: NEGATIVE
Nitrite: NEGATIVE
PROTEIN: NEGATIVE mg/dL
Specific Gravity, Urine: <1.005 — ABNORMAL LOW (ref 1.005–1.030)
pH: 7 (ref 5.0–8.0)

## 2017-01-20 LAB — WET PREP, GENITAL
Clue Cells Wet Prep HPF POC: NONE SEEN
Sperm: NONE SEEN
Trich, Wet Prep: NONE SEEN
Yeast Wet Prep HPF POC: NONE SEEN

## 2017-01-20 MED ORDER — CEFTRIAXONE SODIUM 250 MG IJ SOLR
250.0000 mg | Freq: Once | INTRAMUSCULAR | Status: AC
  Administered 2017-01-20: 250 mg via INTRAMUSCULAR
  Filled 2017-01-20: qty 250

## 2017-01-20 MED ORDER — AZITHROMYCIN 250 MG PO TABS
1000.0000 mg | ORAL_TABLET | Freq: Once | ORAL | Status: AC
  Administered 2017-01-20: 1000 mg via ORAL
  Filled 2017-01-20: qty 4

## 2017-01-20 NOTE — ED Triage Notes (Signed)
Pt c/o pelvic pain for two days with vaginal discharge

## 2017-01-20 NOTE — ED Provider Notes (Signed)
Emergency Department Provider Note   I have reviewed the triage vital signs and the nursing notes.   HISTORY  Chief Complaint Pelvic Pain   HPI Misty Foster is a 46 y.o. female presents to the ED for evaluation of pelvic discomfort and vaginal discharge worsening over the last 2 days. The patient states that she is currently going through a divorce and has had approximately 3 unprotected sexual encounters with different men over the last 3 weeks. She does have a history of genital herpes but denies any recent outbreak symptoms. She felt this she may be getting an outbreak recently but that never materialized. She does report some associated lower abdominal pain. No fever or chills. Denies any dysuria but has had some urinary frequency. No radiation of symptoms. No modifying factors.    Past Medical History:  Diagnosis Date  . Arthritis   . Asthma   . Back pain   . Chronic back pain   . Seizures (HCC)   . 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 Left shoulder pain 01/01/2017  . Pain in right hip 11/10/2016  . Chronic right-sided low back pain without sciatica 11/10/2016  . 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 Neuropathy 06/20/2016  . Chronic back pain 05/01/2016  . Migraine without aura and without status migrainosus, not intractable 02/11/2015  . 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 ABLATION    . CESAREAN SECTION      Current Outpatient Rx  . Order #: [id number] Class: Historical Med  . Order #: [id number] Class: Historical Med  . Order #: [id number] Class: Historical Med  . Order #: [id number] Class: Historical Med  . Order #: [id number] Class: Historical Med  . Order #: [id number] Class: Print  . Order #: [id number] Class: Print  . Order #:  [id number] Class: Print  . Order #: [id number] Class: Normal    Allergies Lamictal [lamotrigine]; Hydrocodone-acetaminophen; and Gabapentin  No family history on file.  Social History Social History  Substance Use Topics  . Smoking status: Former Smoker    Types: Cigarettes  . Smokeless tobacco: Never Used  . Alcohol use No    Review of Systems  Constitutional: No fever/chills Eyes: No visual changes. ENT: No sore throat. Cardiovascular: Denies chest pain. Respiratory: Denies shortness of breath. Gastrointestinal: No abdominal pain.  No nausea, no vomiting.  No diarrhea.  No constipation. Genitourinary: Negative for dysuria. Positive urine frequency and vaginal discharge.  Musculoskeletal: Negative for back pain. Skin: Negative for rash. Neurological: Negative for headaches, focal weakness or numbness.  10-point ROS otherwise negative.  ____________________________________________   PHYSICAL EXAM:  VITAL SIGNS: ED Triage Vitals  Enc Vitals Group     BP 01/20/17 0702 (!) 154/88     Pulse Rate 01/20/17 0702 64     Resp 01/20/17 0702 18     Temp 01/20/17 0702 97.6 F (36.4 C)     Temp Source 01/20/17 0702 Oral     SpO2 01/20/17 0702 100 %     Weight 01/20/17 0704 (!) 342 lb (155.1 kg)     Height 01/20/17 0704  (1.626 m)     Pain Score 01/20/17 0705 4   Constitutional: Alert and oriented. Well appearing and in no acute distress. Eyes: Conjunctivae are normal.  Head: Atraumatic. Nose: No congestion/rhinnorhea. Mouth/Throat: Mucous membranes are moist.  Neck: No stridor.   Cardiovascular: Normal rate, regular rhythm. Good peripheral circulation. Grossly normal heart sounds.   Respiratory: Normal respiratory effort.  No retractions. Lungs CTAB. Gastrointestinal: Soft and nontender. No distention.  Genitourinary: Mild discharge. No external lesions. No CMT. No adnexal tenderness or fullness. No blood.  Musculoskeletal: No lower extremity tenderness nor edema.  No gross deformities of extremities. Neurologic:  Normal speech and language. No gross focal neurologic deficits are appreciated.  Skin:  Skin is warm, dry and intact. No rash noted.   ____________________________________________   LABS (all labs ordered are listed, but only abnormal results are displayed)  Labs Reviewed  WET PREP, GENITAL - Abnormal; Notable for the following:       Result Value   WBC, Wet Prep HPF POC FEW (*)    All other components within normal limits  URINALYSIS, ROUTINE W REFLEX MICROSCOPIC - Abnormal; Notable for the following:    Specific Gravity, Urine <1.005 (*)    All other components within normal limits  PREGNANCY, URINE  HIV ANTIBODY (ROUTINE TESTING)  RPR  GC/CHLAMYDIA PROBE AMP ([place]) NOT AT [facility]   ____________________________________________   PROCEDURES  Procedure(s) performed:   Procedures  None ____________________________________________   INITIAL IMPRESSION / ASSESSMENT AND PLAN / ED COURSE  Pertinent labs & imaging results that were available during my care of the patient were reviewed by me and considered in my medical decision making (see chart for details).  Patient presents to the ED for evaluation of urine frequency in the setting of vaginal discharge and pelvic. Has had several recent unprotected sexual encounters. I am treating for STDs. Following wet prep. No evidence on exam to raise concern for PID or TOA. UA with no UTI. Pregnancy negative. Sending HIV and RPR tests as well.   Differential diagnosis includes but is not exclusive to ectopic pregnancy, ovarian cyst, ovarian torsion, acute appendicitis, urinary tract infection, endometriosis, bowel obstruction, hernia, colitis, renal colic, gastroenteritis, volvulus etc.  Exam and labs unremarkable. Discussed follow up plan and return precautions in detail.   At this time, I do not feel there is any life-threatening condition present. I have reviewed and discussed  all results (EKG, imaging, lab, urine as appropriate), exam findings with patient. I have reviewed nursing notes and appropriate previous records.  I feel the patient is safe to be discharged home without further emergent workup. Discussed usual and customary return precautions. Patient and family (if present) verbalize understanding and are comfortable with this plan.  Patient will follow-up with their primary care provider. If they do not have a primary care provider, information for follow-up has been provided to them. All questions have been answered.  ____________________________________________  FINAL CLINICAL IMPRESSION(S) / ED DIAGNOSES  Final diagnoses:  Pelvic pain in female  Vaginal discharge     MEDICATIONS GIVEN DURING THIS VISIT:  Medications  azithromycin (ZITHROMAX) tablet 1,000 mg (1,000 mg Oral Given 01/20/17 0756)  cefTRIAXone (ROCEPHIN) injection 250 mg (250 mg Intramuscular Given 01/20/17 0757)     NEW OUTPATIENT MEDICATIONS STARTED DURING THIS VISIT:  None   Note:  This document was prepared using Dragon voice recognition software and may include unintentional dictation errors.  Alona Bene, MD Emergency Medicine     Josey Forcier, Arlyss Repress, MD 01/21/17 [phone]

## 2017-01-20 NOTE — Discharge Instructions (Signed)
You have been seen today in the Emergency Department (ED) for pelvic pain and discharge.  Your workup today shows that you may have had one or more sexually transmitted infections (STIs).  You have been treated with a one time dose medication for both of these conditions.  Please have your partner tested for STD's and do not resume sexual activity until you and your partner have confirmed negative test results or have both been treated.  Please follow up with your doctor as soon as possible regarding today?s ED visit and your symptoms.   Return to the ED if your pain worsens, you develop a fever, or for any other symptoms that concern you.

## 2017-01-21 LAB — RPR: RPR Ser Ql: NONREACTIVE

## 2017-01-21 LAB — GC/CHLAMYDIA PROBE AMP (~~LOC~~) NOT AT ARMC
CHLAMYDIA, DNA PROBE: NEGATIVE
Neisseria Gonorrhea: NEGATIVE

## 2017-01-21 LAB — HIV ANTIBODY (ROUTINE TESTING W REFLEX): HIV SCREEN 4TH GENERATION: NONREACTIVE

## 2017-01-22 NOTE — ED Notes (Signed)
Pt. Called for STD results.  Result given to pt.

## 2017-01-30 ENCOUNTER — Ambulatory Visit: Payer: Managed Care, Other (non HMO) | Admitting: Family Medicine

## 2017-02-04 ENCOUNTER — Ambulatory Visit
Admission: RE | Admit: 2017-02-04 | Discharge: 2017-02-04 | Disposition: A | Discharge status: Home / Self Care | Payer: Managed Care, Other (non HMO) | Source: Ambulatory Visit | Attending: Internal Medicine | Admitting: Internal Medicine

## 2017-02-04 DIAGNOSIS — Z1231 Encounter for screening mammogram for malignant neoplasm of breast: Secondary | ICD-10-CM | POA: Diagnosis not present

## 2017-02-24 ENCOUNTER — Ambulatory Visit (HOSPITAL_BASED_OUTPATIENT_CLINIC_OR_DEPARTMENT_OTHER)
Admission: RE | Admit: 2017-02-24 | Discharge: 2017-02-24 | Disposition: A | Discharge status: Home / Self Care | Payer: Managed Care, Other (non HMO) | Source: Ambulatory Visit | Attending: Family Medicine | Admitting: Family Medicine

## 2017-02-24 ENCOUNTER — Ambulatory Visit (INDEPENDENT_AMBULATORY_CARE_PROVIDER_SITE_OTHER): Payer: Managed Care, Other (non HMO) | Admitting: Family Medicine

## 2017-02-24 ENCOUNTER — Encounter: Payer: Self-pay | Admitting: Family Medicine

## 2017-02-24 DIAGNOSIS — M19012 Primary osteoarthritis, left shoulder: Secondary | ICD-10-CM | POA: Diagnosis not present

## 2017-02-24 DIAGNOSIS — M25512 Pain in left shoulder: Secondary | ICD-10-CM | POA: Insufficient documentation

## 2017-02-24 NOTE — Patient Instructions (Signed)
You're not improving as expected for rotator cuff impingement. We will go ahead with an MRI of your shoulder to assess for rotator cuff tear. Try to avoid painful activities (overhead activities, lifting with extended arm) as much as possible. Aleve 2 tabs twice a day with food OR ibuprofen 3 tabs three times a day with food for pain and inflammation. Continue your tylenol #3, baclofen - talk to the doctor who prescribes these to see if something else may help you. We will consider repeat injection, physical therapy, nitro patches, surgical referral depending on what the MRI shows.

## 2017-02-24 NOTE — Assessment & Plan Note (Signed)
concerning she has not improved with subacromial injection, home exercise program.  Will go ahead with MRI to assess for rotator cuff tear.  Aleve or ibuprofen.  Continue tylenol #3, baclofen.  Radiographs appeared normal today.

## 2017-02-24 NOTE — Progress Notes (Signed)
PCP: Georgann Housekeeper, MD  Subjective:   HPI: Patient is a 46 y.o. female here for left shoulder pain.  9/5: Patient reports she started to get pain in lateral left shoulder a few months ago but really worsened past week. She started to get some pain into here with swimming, trouble lifting the arm normally. Pain is 4/10 now but up to 10/10 and sharp at worst. Cannot lie on this - worse at night. No skin changes.  Gets some numbness just yesterday in left ring, pinky fingers. No neck pain currently. Takes tylenol #3 and baclofen for her back and hip.  10/30: Patient reports her pain has worsened since last visit. Injection helped only a little for 2 weeks. Pain is now 10/10 and sharp posterior to anterior shoulder. Radiates into upper arm, skips forearm and feels pain in palm of hand. Difficulty reaching overhead, gripping items. No numbness. Woke her up all night last night. Takes tylenol #3 and baclofen without benefit. No skin changes.  Past Medical History:  Diagnosis Date  . Arthritis   . Asthma   . Back pain   . Chronic back pain   . Seizures (HCC)   . 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 acetaminophen-codeine (TYLENOL #3) 300-30 MG tablet     . albuterol (VENTOLIN HFA) 108 (90 Base) MCG/ACT inhaler Inhale 1 puff into the lungs every 4 (four) hours as needed for wheezing.    . baclofen (LIORESAL) 10 MG tablet Take 10-20 mg by mouth daily.   4  . baclofen (LIORESAL) 10 MG tablet TAKE 2 TABLETS BY MOUTH AT NIGHT    . diphenhydramine-acetaminophen (TYLENOL PM) 25-500 MG TABS tablet Take 1 tablet by mouth at bedtime as needed (sleep).    Marland Kitchen lidocaine (LIDODERM) 5 % Place 1 patch onto the skin daily. Remove & Discard patch within 12 hours or as directed by MD 15 patch 0  . metroNIDAZOLE (FLAGYL) 500 MG tablet Take 1 tablet (500 mg total) by mouth 2 (two) times daily. 14 tablet 0  .  PHENobarbital (LUMINAL) 97.2 MG tablet Take 1.5 tablets (145.8 mg total) by mouth every evening. 45 tablet 5  . Vitamin D, Ergocalciferol, (DRISDOL) 50000 units CAPS capsule Take one capsule once a week for 8 weeks. (Patient not taking: Reported on 08/30/2016) 8 capsule 0   No current facility-administered medications on file prior to visit.     Past Surgical History:  Procedure Laterality Date  . ABLATION    . CESAREAN SECTION      Allergies  Allergen Reactions  . Lamictal [Lamotrigine] Nausea And Vomiting  . Hydrocodone-Acetaminophen Other (See Comments)    Auras  . Gabapentin     Social History   Social History  . Marital status: Legally Separated    Spouse name: N/A  . Number of children: N/A  . Years of education: N/A   Occupational History  . Not on file.   Social History Main Topics  . Smoking status: Former Smoker    Types: Cigarettes  . Smokeless tobacco: Never Used  . Alcohol use No  . Drug use: No  . Sexual activity: Not on file   Other Topics Concern  . Not on file   Social History Narrative  . No narrative on file    Family History  Problem Relation Age of Onset  . Breast cancer Maternal Grandmother     BP Marland Kitchen)  149/103   Pulse 87   Ht 5\' 4"  (1.626 m)   Wt (!) 340 lb (154.2 kg)   LMP 02/04/2017   BMI 58.36 kg/m   Review of Systems: See HPI above.     Objective:  Physical Exam:  Gen: NAD, comfortable in exam room.  Left shoulder: No swelling, ecchymoses.  No gross deformity. No TTP. Full ER, IR though ER painful.  Abduction and flexion to 80 degrees, painful. Positive Hawkins, Neers. Negative Yergasons. Strength 4/5 with empty can and resisted external rotation.  5/5 IR. NV intact distally.  Right shoulder: No swelling, ecchymoses.  No gross deformity. No TTP. FROM. Strength 5/5 with empty can and resisted internal/external rotation. NV intact distally.   Assessment & Plan:  1. Left shoulder pain - concerning she has not  improved with subacromial injection, home exercise program.  Will go ahead with MRI to assess for rotator cuff tear.  Aleve or ibuprofen.  Continue tylenol #3, baclofen.  Radiographs appeared normal today.

## 2017-02-26 ENCOUNTER — Ambulatory Visit
Admission: RE | Admit: 2017-02-26 | Discharge: 2017-02-26 | Disposition: A | Discharge status: Home / Self Care | Payer: Managed Care, Other (non HMO) | Source: Ambulatory Visit | Attending: Family Medicine | Admitting: Family Medicine

## 2017-02-26 DIAGNOSIS — M25512 Pain in left shoulder: Secondary | ICD-10-CM

## 2017-02-26 DIAGNOSIS — M75121 Complete rotator cuff tear or rupture of right shoulder, not specified as traumatic: Secondary | ICD-10-CM | POA: Diagnosis not present

## 2017-02-26 NOTE — Addendum Note (Signed)
Addended by: Kathi SimpersWISE, Aslynn Brunetti F on: 02/26/2017 09:03 AM   Modules accepted: Orders

## 2017-02-26 NOTE — Addendum Note (Signed)
Addended by: Kathi SimpersWISE, Tamarcus Condie F on: 02/26/2017 09:30 AM   Modules accepted: Orders

## 2017-03-02 ENCOUNTER — Ambulatory Visit (INDEPENDENT_AMBULATORY_CARE_PROVIDER_SITE_OTHER): Payer: Managed Care, Other (non HMO) | Admitting: Family Medicine

## 2017-03-02 ENCOUNTER — Encounter: Payer: Self-pay | Admitting: Family Medicine

## 2017-03-02 DIAGNOSIS — M25512 Pain in left shoulder: Secondary | ICD-10-CM

## 2017-03-02 MED ORDER — METHYLPREDNISOLONE ACETATE 40 MG/ML IJ SUSP
40.0000 mg | Freq: Once | INTRAMUSCULAR | Status: AC
  Administered 2017-03-02: 40 mg via INTRA_ARTICULAR

## 2017-03-03 ENCOUNTER — Encounter: Payer: Self-pay | Admitting: Family Medicine

## 2017-03-03 NOTE — Assessment & Plan Note (Signed)
MRI reviewed and discussed with patient - advanced glenohumeral arthritis and mod-severe rotator cuff tendinopathy with small tear of supraspinatus.  Subacromial injection did not help nor home exercises.  Went ahead with glenohumeral injection.  Advised if still not improving over next 1-2 weeks would consider ortho referral - discussed with all of findings and severity of arthritis it's likely replacement would be recommended.  After informed written consent timeout was performed, patient was seated on exam table. Left shoulder was prepped with alcohol swab and utilizing posterior approach, patient's left glenohumeral space was injected with 3:1 bupivicaine: depomedrol. Patient tolerated the procedure well without immediate complications.

## 2017-03-03 NOTE — Progress Notes (Signed)
PCP: Georgann HousekeeperHusain, Karrar, MD  Subjective:   HPI: Patient is a 46 y.o. female here for left shoulder pain.  9/5: Patient reports she started to get pain in lateral left shoulder a few months ago but really worsened past week. She started to get some pain into here with swimming, trouble lifting the arm normally. Pain is 4/10 now but up to 10/10 and sharp at worst. Cannot lie on this - worse at night. No skin changes.  Gets some numbness just yesterday in left ring, pinky fingers. No neck pain currently. Takes tylenol #3 and baclofen for her back and hip.  10/30: Patient reports her pain has worsened since last visit. Injection helped only a little for 2 weeks. Pain is now 10/10 and sharp posterior to anterior shoulder. Radiates into upper arm, skips forearm and feels pain in palm of hand. Difficulty reaching overhead, gripping items. No numbness. Woke her up all night last night. Takes tylenol #3 and baclofen without benefit. No skin changes.  11/5: Patient returns today for intraarticular injection after we discussed MRI results.  Past Medical History:  Diagnosis Date  . Arthritis   . Asthma   . Back pain   . Chronic back pain   . Seizures (HCC)   . Tuberous sclerosis (HCC)     Current Outpatient Medications on File Prior to Visit  Medication Sig Dispense Refill  . acetaminophen-codeine (TYLENOL #3) 300-30 MG tablet     . albuterol (VENTOLIN HFA) 108 (90 Base) MCG/ACT inhaler Inhale 1 puff into the lungs every 4 (four) hours as needed for wheezing.    . baclofen (LIORESAL) 10 MG tablet Take 10-20 mg by mouth daily.   4  . baclofen (LIORESAL) 10 MG tablet TAKE 2 TABLETS BY MOUTH AT NIGHT    . diphenhydramine-acetaminophen (TYLENOL PM) 25-500 MG TABS tablet Take 1 tablet by mouth at bedtime as needed (sleep).    Marland Kitchen. lidocaine (LIDODERM) 5 % Place 1 patch onto the skin daily. Remove & Discard patch within 12 hours or as directed by MD 15 patch 0  . metroNIDAZOLE (FLAGYL) 500 MG  tablet Take 1 tablet (500 mg total) by mouth 2 (two) times daily. 14 tablet 0  . PHENobarbital (LUMINAL) 97.2 MG tablet Take 1.5 tablets (145.8 mg total) by mouth every evening. 45 tablet 5  . Vitamin D, Ergocalciferol, (DRISDOL) 50000 units CAPS capsule Take one capsule once a week for 8 weeks. (Patient not taking: Reported on 08/30/2016) 8 capsule 0   No current facility-administered medications on file prior to visit.     Past Surgical History:  Procedure Laterality Date  . ABLATION    . CESAREAN SECTION      Allergies  Allergen Reactions  . Lamictal [Lamotrigine] Nausea And Vomiting  . Hydrocodone-Acetaminophen Other (See Comments)    Auras  . Gabapentin     Social History   Socioeconomic History  . Marital status: Legally Separated    Spouse name: Not on file  . Number of children: Not on file  . Years of education: Not on file  . Highest education level: Not on file  Social Needs  . Financial resource strain: Not on file  . Food insecurity - worry: Not on file  . Food insecurity - inability: Not on file  . Transportation needs - medical: Not on file  . Transportation needs - non-medical: Not on file  Occupational History  . Not on file  Tobacco Use  . Smoking status: Former Smoker  Types: Cigarettes  . Smokeless tobacco: Never Used  Substance and Sexual Activity  . Alcohol use: No  . Drug use: No  . Sexual activity: Not on file  Other Topics Concern  . Not on file  Social History Narrative  . Not on file    Family History  Problem Relation Age of Onset  . Breast cancer Maternal Grandmother     Ht 5\' 5"  (1.651 m)   Wt (!) 340 lb (154.2 kg)   LMP 02/04/2017   BMI 56.58 kg/m   Review of Systems: See HPI above.     Objective:  Physical Exam:  Exam not repeated today.  Gen: NAD, comfortable in exam room.  Left shoulder: No swelling, ecchymoses.  No gross deformity. No TTP. Full ER, IR though ER painful.  Abduction and flexion to 80 degrees,  painful. Positive Hawkins, Neers. Negative Yergasons. Strength 4/5 with empty can and resisted external rotation.  5/5 IR. NV intact distally.  Right shoulder: No swelling, ecchymoses.  No gross deformity. No TTP. FROM. Strength 5/5 with empty can and resisted internal/external rotation. NV intact distally.   Assessment & Plan:  1. Left shoulder pain - MRI reviewed and discussed with patient - advanced glenohumeral arthritis and mod-severe rotator cuff tendinopathy with small tear of supraspinatus.  Subacromial injection did not help nor home exercises.  Went ahead with glenohumeral injection.  Advised if still not improving over next 1-2 weeks would consider ortho referral - discussed with all of findings and severity of arthritis it's likely replacement would be recommended.  After informed written consent timeout was performed, patient was seated on exam table. Left shoulder was prepped with alcohol swab and utilizing posterior approach, patient's left glenohumeral space was injected with 3:1 bupivicaine: depomedrol. Patient tolerated the procedure well without immediate complications.

## 2017-03-09 ENCOUNTER — Telehealth: Payer: Self-pay | Admitting: Family Medicine

## 2017-03-09 NOTE — Telephone Encounter (Signed)
Patient wants to know what to do regarding her shoulder while waiting for her appointment with the orthopedic surgeon. She states they moved her back to her regular position at work which is more physically demanding. She is concerned that the work load of her job will injure her more   °

## 2017-03-09 NOTE — Telephone Encounter (Signed)
Appointment made. Will contact patient to give appointment information.

## 2017-03-09 NOTE — Telephone Encounter (Signed)
We discussed if she wasn't improving even with the shot I would recommend she see orthopedic surgeon to discuss shoulder replacement.  Does she want to go ahead with this?

## 2017-03-09 NOTE — Telephone Encounter (Signed)
Ok to go ahead with referral.  Thanks! 

## 2017-03-09 NOTE — Telephone Encounter (Signed)
Patient would like to be excused from work until her appointment. [facility]

## 2017-03-09 NOTE — Addendum Note (Signed)
Addended by: Kathi SimpersWISE, Akiko Schexnider F on: 03/09/2017 02:37 PM   Modules accepted: Orders

## 2017-03-09 NOTE — Telephone Encounter (Signed)
Spoke with patient and she would like to go ahead with seeing an orthopedic surgeon

## 2017-03-09 NOTE — Telephone Encounter (Signed)
I could take her out of work until her visit with orthopedic surgeon once she has appointment.

## 2017-03-09 NOTE — Telephone Encounter (Signed)
Note written and printed

## 2017-03-09 NOTE — Telephone Encounter (Signed)
Patient wants to know what to do regarding her shoulder while waiting for her appointment with the orthopedic surgeon. She states they moved her back to her regular position at work which is more physically demanding. She is concerned that the work load of her job will injure her more

## 2017-03-09 NOTE — Telephone Encounter (Signed)
Patient left a message at 6am this morning with concerns about her left shoulder/arm.   She stated the injection has not relieved any of her pain. She stayed out of work last week (Wednesday, Thursday, and Friday) due to the pain. States she is having numbness and tingling in her left hand and the pain shoots from her left shoulder down her arm.

## 2017-03-11 DIAGNOSIS — M75112 Incomplete rotator cuff tear or rupture of left shoulder, not specified as traumatic: Secondary | ICD-10-CM | POA: Diagnosis not present

## 2017-03-11 NOTE — Telephone Encounter (Signed)
Note taken up front to be picked up.

## 2017-03-16 ENCOUNTER — Telehealth: Payer: Self-pay | Admitting: Neurology

## 2017-03-16 ENCOUNTER — Ambulatory Visit (INDEPENDENT_AMBULATORY_CARE_PROVIDER_SITE_OTHER): Payer: Managed Care, Other (non HMO) | Admitting: Orthopaedic Surgery

## 2017-03-16 NOTE — Telephone Encounter (Signed)
[facility] last week about if patient was ok to have surgery she was calling to check the status of that

## 2017-03-17 DIAGNOSIS — M75102 Unspecified rotator cuff tear or rupture of left shoulder, not specified as traumatic: Secondary | ICD-10-CM | POA: Diagnosis not present

## 2017-03-17 NOTE — Telephone Encounter (Signed)
Paperwork was not faxed until after hours on Thursday - received Friday.  Given to Dr. Karel JarvisAquino to fill out.

## 2017-03-24 DIAGNOSIS — G40909 Epilepsy, unspecified, not intractable, without status epilepticus: Secondary | ICD-10-CM | POA: Diagnosis not present

## 2017-03-24 DIAGNOSIS — G8929 Other chronic pain: Secondary | ICD-10-CM | POA: Diagnosis not present

## 2017-03-24 DIAGNOSIS — M25512 Pain in left shoulder: Secondary | ICD-10-CM | POA: Diagnosis not present

## 2017-03-24 DIAGNOSIS — Z01818 Encounter for other preprocedural examination: Secondary | ICD-10-CM | POA: Diagnosis not present

## 2017-03-30 NOTE — Pre-Procedure Instructions (Signed)
Mckenzi Ladoris GeneJenkins Klinge  03/30/2017      [facility] [phone] [place] - [address] AT [facility] [address] [place] [id number] Phone: [phone] Fax: [phone]    Your procedure is scheduled on Tuesday 04/07/2017.  [facility] at 12:55 P.M.  Call this number if you have problems the morning of surgery:  [phone]   Remember:  Do not eat food or drink liquids after midnight.    Continue all medications as directed by your physician except follow these medication instructions before surgery below   Take these medicines the morning of surgery with A SIP OF WATER: Tylenol #3 - if needed Albuterol inhaler - if needed (Bring with you to the hospital)  7 days prior to surgery STOP taking any Aspirin(unless otherwise instructed by your surgeon), Aleve, Naproxen, Ibuprofen, Motrin, Advil, Goody's, BC's, all herbal medications, fish oil, and all vitamins     Do not wear jewelry, make-up or nail polish.  Do not wear lotions, powders, or perfumes, or deodorant.  Do not shave 48 hours prior to surgery.    Do not bring valuables to the hospital.  [facility] is not responsible for any belongings or valuables.  Contacts, dentures or bridgework may not be worn into surgery.  Leave your suitcase in the car.  After surgery it may be brought to your room.  For patients admitted to the hospital, discharge time will be determined by your treatment team.  Patients discharged the day of surgery will not be allowed to drive home.   Name and phone number of your driver:    Special instructions:   [place]- Preparing For Surgery  Before surgery, you can play an important role. Because skin is not sterile, your skin needs to be as free of germs as possible. You can reduce the number of germs on your skin by washing with CHG (chlorahexidine gluconate) Soap before surgery.  CHG is an antiseptic  cleaner which kills germs and bonds with the skin to continue killing germs even after washing.  Please do not use if you have an allergy to CHG or antibacterial soaps. If your skin becomes reddened/irritated stop using the CHG.  Do not shave (including legs and underarms) for at least 48 hours prior to first CHG shower. It is OK to shave your face.  Please follow these instructions carefully.   1. Shower the NIGHT BEFORE SURGERY and the MORNING OF SURGERY with CHG.   2. If you chose to wash your hair, wash your hair first as usual with your normal shampoo.  3. After you shampoo, rinse your hair and body thoroughly to remove the shampoo.  4. Use CHG as you would any other liquid soap. You can apply CHG directly to the skin and wash gently with a scrungie or a clean washcloth.   5. Apply the CHG Soap to your body ONLY FROM THE NECK DOWN.  Do not use on open wounds or open sores. Avoid contact with your eyes, ears, mouth and genitals (private parts). Wash Face and genitals (private parts)  with your normal soap.  6. Wash thoroughly, paying special attention to the area where your surgery will be performed.  7. Thoroughly rinse your body with warm water from the neck down.  8. DO NOT shower/wash with your normal soap after using and rinsing off the CHG Soap.  9. Pat yourself dry with  a CLEAN TOWEL.  10. Wear CLEAN PAJAMAS to bed the night before surgery, wear comfortable clothes the morning of surgery  11. Place CLEAN SHEETS on your bed the night of your first shower and DO NOT SLEEP WITH PETS.    Day of Surgery: Shower as stated above. Do not apply any deodorants/lotions. Please wear clean clothes to the hospital/surgery center.      Please read over the following fact sheets that you were given. Pain Booklet, Coughing and Deep Breathing and Surgical Site Infection Prevention

## 2017-03-31 ENCOUNTER — Encounter (HOSPITAL_COMMUNITY)
Admission: RE | Admit: 2017-03-31 | Discharge: 2017-03-31 | Disposition: A | Discharge status: Home / Self Care | Payer: Managed Care, Other (non HMO) | Source: Ambulatory Visit | Attending: Orthopedic Surgery | Admitting: Orthopedic Surgery

## 2017-03-31 ENCOUNTER — Encounter (HOSPITAL_COMMUNITY): Payer: Self-pay

## 2017-03-31 ENCOUNTER — Other Ambulatory Visit: Payer: Self-pay

## 2017-03-31 DIAGNOSIS — R69 Illness, unspecified: Secondary | ICD-10-CM | POA: Diagnosis not present

## 2017-03-31 DIAGNOSIS — Z833 Family history of diabetes mellitus: Secondary | ICD-10-CM | POA: Insufficient documentation

## 2017-03-31 DIAGNOSIS — Z01812 Encounter for preprocedural laboratory examination: Secondary | ICD-10-CM | POA: Insufficient documentation

## 2017-03-31 DIAGNOSIS — M797 Fibromyalgia: Secondary | ICD-10-CM | POA: Diagnosis not present

## 2017-03-31 DIAGNOSIS — J45909 Unspecified asthma, uncomplicated: Secondary | ICD-10-CM | POA: Insufficient documentation

## 2017-03-31 DIAGNOSIS — G40909 Epilepsy, unspecified, not intractable, without status epilepticus: Secondary | ICD-10-CM | POA: Diagnosis not present

## 2017-03-31 DIAGNOSIS — M25512 Pain in left shoulder: Secondary | ICD-10-CM | POA: Insufficient documentation

## 2017-03-31 DIAGNOSIS — F17200 Nicotine dependence, unspecified, uncomplicated: Secondary | ICD-10-CM | POA: Diagnosis not present

## 2017-03-31 LAB — CBC
HEMATOCRIT: 38.2 % (ref 36.0–46.0)
Hemoglobin: 12.5 g/dL (ref 12.0–15.0)
MCH: 30.8 pg (ref 26.0–34.0)
MCHC: 32.7 g/dL (ref 30.0–36.0)
MCV: 94.1 fL (ref 78.0–100.0)
PLATELETS: 216 10*3/uL (ref 150–400)
RBC: 4.06 MIL/uL (ref 3.87–5.11)
RDW: 13.1 % (ref 11.5–15.5)
WBC: 6.5 10*3/uL (ref 4.0–10.5)

## 2017-03-31 LAB — HCG, SERUM, QUALITATIVE: Preg, Serum: NEGATIVE

## 2017-04-01 ENCOUNTER — Other Ambulatory Visit: Payer: Self-pay | Admitting: Orthopedic Surgery

## 2017-04-02 ENCOUNTER — Other Ambulatory Visit (HOSPITAL_COMMUNITY): Payer: Managed Care, Other (non HMO)

## 2017-04-06 MED ORDER — CEFAZOLIN SODIUM 10 G IJ SOLR
3.0000 g | INTRAMUSCULAR | Status: AC
  Administered 2017-04-07: 3 g via INTRAVENOUS
  Filled 2017-04-06: qty 3000

## 2017-04-07 ENCOUNTER — Ambulatory Visit (HOSPITAL_COMMUNITY): Payer: Managed Care, Other (non HMO) | Admitting: Certified Registered Nurse Anesthetist

## 2017-04-07 ENCOUNTER — Observation Stay (HOSPITAL_COMMUNITY)
Admission: RE | Admit: 2017-04-07 | Discharge: 2017-04-08 | Disposition: A | Discharge status: Home / Self Care | Payer: Managed Care, Other (non HMO) | Source: Ambulatory Visit | Attending: Orthopedic Surgery | Admitting: Orthopedic Surgery

## 2017-04-07 ENCOUNTER — Encounter (HOSPITAL_COMMUNITY): Payer: Self-pay | Admitting: Anesthesiology

## 2017-04-07 ENCOUNTER — Encounter (HOSPITAL_COMMUNITY)
Admission: RE | Disposition: A | Discharge status: Home / Self Care | Payer: Self-pay | Source: Ambulatory Visit | Attending: Orthopedic Surgery

## 2017-04-07 DIAGNOSIS — G43909 Migraine, unspecified, not intractable, without status migrainosus: Secondary | ICD-10-CM | POA: Diagnosis not present

## 2017-04-07 DIAGNOSIS — M1990 Unspecified osteoarthritis, unspecified site: Secondary | ICD-10-CM | POA: Insufficient documentation

## 2017-04-07 DIAGNOSIS — F17210 Nicotine dependence, cigarettes, uncomplicated: Secondary | ICD-10-CM | POA: Insufficient documentation

## 2017-04-07 DIAGNOSIS — G8929 Other chronic pain: Secondary | ICD-10-CM | POA: Diagnosis not present

## 2017-04-07 DIAGNOSIS — R69 Illness, unspecified: Secondary | ICD-10-CM | POA: Diagnosis not present

## 2017-04-07 DIAGNOSIS — M24112 Other articular cartilage disorders, left shoulder: Secondary | ICD-10-CM | POA: Diagnosis not present

## 2017-04-07 DIAGNOSIS — M7542 Impingement syndrome of left shoulder: Secondary | ICD-10-CM | POA: Diagnosis not present

## 2017-04-07 DIAGNOSIS — M75102 Unspecified rotator cuff tear or rupture of left shoulder, not specified as traumatic: Secondary | ICD-10-CM | POA: Diagnosis present

## 2017-04-07 DIAGNOSIS — Z79899 Other long term (current) drug therapy: Secondary | ICD-10-CM | POA: Diagnosis not present

## 2017-04-07 DIAGNOSIS — J45909 Unspecified asthma, uncomplicated: Secondary | ICD-10-CM | POA: Diagnosis not present

## 2017-04-07 DIAGNOSIS — S43432A Superior glenoid labrum lesion of left shoulder, initial encounter: Secondary | ICD-10-CM | POA: Diagnosis not present

## 2017-04-07 DIAGNOSIS — G8918 Other acute postprocedural pain: Secondary | ICD-10-CM | POA: Diagnosis not present

## 2017-04-07 DIAGNOSIS — S46012A Strain of muscle(s) and tendon(s) of the rotator cuff of left shoulder, initial encounter: Secondary | ICD-10-CM | POA: Diagnosis not present

## 2017-04-07 DIAGNOSIS — M75112 Incomplete rotator cuff tear or rupture of left shoulder, not specified as traumatic: Principal | ICD-10-CM | POA: Diagnosis present

## 2017-04-07 DIAGNOSIS — Q851 Tuberous sclerosis: Secondary | ICD-10-CM | POA: Diagnosis not present

## 2017-04-07 HISTORY — DX: Incomplete rotator cuff tear or rupture of left shoulder, not specified as traumatic: M75.112

## 2017-04-07 HISTORY — PX: SHOULDER ARTHROSCOPY WITH ROTATOR CUFF REPAIR: SHX5685

## 2017-04-07 SURGERY — ARTHROSCOPY, SHOULDER, WITH ROTATOR CUFF REPAIR
Anesthesia: General | Laterality: Left

## 2017-04-07 MED ORDER — KETOROLAC TROMETHAMINE 30 MG/ML IJ SOLN: 30.0000 mg | Freq: Once | INTRAMUSCULAR | Status: DC | PRN

## 2017-04-07 MED ORDER — ALUM & MAG HYDROXIDE-SIMETH 200-200-20 MG/5ML PO SUSP: 30.0000 mL | ORAL | Status: DC | PRN

## 2017-04-07 MED ORDER — DEXAMETHASONE SODIUM PHOSPHATE 10 MG/ML IJ SOLN
INTRAMUSCULAR | Status: DC | PRN
  Administered 2017-04-07: 10 mg via INTRAVENOUS

## 2017-04-07 MED ORDER — FENTANYL CITRATE (PF) 250 MCG/5ML IJ SOLN
INTRAMUSCULAR | Status: DC | PRN
  Administered 2017-04-07 (×2): 50 ug via INTRAVENOUS

## 2017-04-07 MED ORDER — FENTANYL CITRATE (PF) 100 MCG/2ML IJ SOLN: 25.0000 ug | INTRAMUSCULAR | Status: DC | PRN

## 2017-04-07 MED ORDER — ONDANSETRON HCL 4 MG/2ML IJ SOLN: 4.0000 mg | Freq: Four times a day (QID) | INTRAMUSCULAR | Status: DC | PRN

## 2017-04-07 MED ORDER — LIDOCAINE 2% (20 MG/ML) 5 ML SYRINGE
INTRAMUSCULAR | Status: AC
  Filled 2017-04-07: qty 5

## 2017-04-07 MED ORDER — ROCURONIUM BROMIDE 10 MG/ML (PF) SYRINGE
PREFILLED_SYRINGE | INTRAVENOUS | Status: DC | PRN
  Administered 2017-04-07: 20 mg via INTRAVENOUS
  Administered 2017-04-07: 30 mg via INTRAVENOUS
  Administered 2017-04-07: 70 mg via INTRAVENOUS

## 2017-04-07 MED ORDER — ONDANSETRON HCL 4 MG PO TABS: 4.0000 mg | ORAL_TABLET | Freq: Three times a day (TID) | ORAL | 0 refills | Status: DC | PRN

## 2017-04-07 MED ORDER — ONDANSETRON HCL 4 MG/2ML IJ SOLN
INTRAMUSCULAR | Status: DC | PRN
  Administered 2017-04-07: 4 mg via INTRAVENOUS

## 2017-04-07 MED ORDER — HYDROMORPHONE HCL 1 MG/ML IJ SOLN: 0.5000 mg | INTRAMUSCULAR | Status: DC | PRN

## 2017-04-07 MED ORDER — DOCUSATE SODIUM 100 MG PO CAPS
100.0000 mg | ORAL_CAPSULE | Freq: Two times a day (BID) | ORAL | Status: DC
  Administered 2017-04-07: 100 mg via ORAL
  Filled 2017-04-07 (×2): qty 1

## 2017-04-07 MED ORDER — PROPOFOL 10 MG/ML IV BOLUS
INTRAVENOUS | Status: AC
  Filled 2017-04-07: qty 20

## 2017-04-07 MED ORDER — MAGNESIUM CITRATE PO SOLN: 1.0000 | Freq: Once | ORAL | Status: DC | PRN

## 2017-04-07 MED ORDER — SUGAMMADEX SODIUM 500 MG/5ML IV SOLN
INTRAVENOUS | Status: AC
  Filled 2017-04-07: qty 5

## 2017-04-07 MED ORDER — SENNA 8.6 MG PO TABS
1.0000 | ORAL_TABLET | Freq: Two times a day (BID) | ORAL | Status: DC
  Filled 2017-04-07 (×2): qty 1

## 2017-04-07 MED ORDER — METHOCARBAMOL 500 MG PO TABS
500.0000 mg | ORAL_TABLET | Freq: Four times a day (QID) | ORAL | Status: DC | PRN
Start: 2017-04-07 — End: 2017-04-08

## 2017-04-07 MED ORDER — METOCLOPRAMIDE HCL 5 MG PO TABS: 5.0000 mg | ORAL_TABLET | Freq: Three times a day (TID) | ORAL | Status: DC | PRN

## 2017-04-07 MED ORDER — PHENOBARBITAL 32.4 MG PO TABS
145.8000 mg | ORAL_TABLET | Freq: Every evening | ORAL | Status: DC
  Administered 2017-04-07: 145.8 mg via ORAL
  Filled 2017-04-07: qty 5

## 2017-04-07 MED ORDER — PHENYLEPHRINE HCL 10 MG/ML IJ SOLN
INTRAMUSCULAR | Status: DC | PRN
  Administered 2017-04-07: 25 ug/min via INTRAVENOUS

## 2017-04-07 MED ORDER — FENTANYL CITRATE (PF) 100 MCG/2ML IJ SOLN
INTRAMUSCULAR | Status: AC
  Administered 2017-04-07: 50 ug via INTRAVENOUS
  Filled 2017-04-07: qty 2

## 2017-04-07 MED ORDER — FENTANYL CITRATE (PF) 250 MCG/5ML IJ SOLN
INTRAMUSCULAR | Status: AC
  Filled 2017-04-07: qty 5

## 2017-04-07 MED ORDER — MEPERIDINE HCL 25 MG/ML IJ SOLN: 6.2500 mg | INTRAMUSCULAR | Status: DC | PRN

## 2017-04-07 MED ORDER — PHENYLEPHRINE HCL 10 MG/ML IJ SOLN
INTRAMUSCULAR | Status: DC | PRN
  Administered 2017-04-07: 120 ug via INTRAVENOUS

## 2017-04-07 MED ORDER — MIDAZOLAM HCL 2 MG/2ML IJ SOLN
1.0000 mg | Freq: Once | INTRAMUSCULAR | Status: AC
  Administered 2017-04-07: 1 mg via INTRAVENOUS
  Filled 2017-04-07: qty 1

## 2017-04-07 MED ORDER — DIPHENHYDRAMINE HCL 12.5 MG/5ML PO ELIX: 12.5000 mg | ORAL_SOLUTION | ORAL | Status: DC | PRN

## 2017-04-07 MED ORDER — ALBUTEROL SULFATE HFA 108 (90 BASE) MCG/ACT IN AERS
INHALATION_SPRAY | RESPIRATORY_TRACT | Status: AC
  Filled 2017-04-07: qty 6.7

## 2017-04-07 MED ORDER — PHENYLEPHRINE 40 MCG/ML (10ML) SYRINGE FOR IV PUSH (FOR BLOOD PRESSURE SUPPORT)
PREFILLED_SYRINGE | INTRAVENOUS | Status: AC
  Filled 2017-04-07: qty 10

## 2017-04-07 MED ORDER — POTASSIUM CHLORIDE IN NACL 20-0.45 MEQ/L-% IV SOLN
INTRAVENOUS | Status: DC
  Administered 2017-04-08: via INTRAVENOUS
  Filled 2017-04-07 (×2): qty 1000

## 2017-04-07 MED ORDER — ACETAMINOPHEN 650 MG RE SUPP: 650.0000 mg | RECTAL | Status: DC | PRN

## 2017-04-07 MED ORDER — CHLORHEXIDINE GLUCONATE 4 % EX LIQD: 60.0000 mL | Freq: Once | CUTANEOUS | Status: DC

## 2017-04-07 MED ORDER — LACTATED RINGERS IV SOLN
INTRAVENOUS | Status: DC
Start: 2017-04-07 — End: 2017-04-07
  Administered 2017-04-07: 12:00:00 via INTRAVENOUS

## 2017-04-07 MED ORDER — OXYCODONE HCL 5 MG PO TABS
10.0000 mg | ORAL_TABLET | ORAL | Status: DC | PRN
  Administered 2017-04-08 (×2): 10 mg via ORAL
  Filled 2017-04-07 (×2): qty 2

## 2017-04-07 MED ORDER — LACTATED RINGERS IV SOLN
INTRAVENOUS | Status: DC | PRN
  Administered 2017-04-07 (×2): via INTRAVENOUS

## 2017-04-07 MED ORDER — FENTANYL CITRATE (PF) 100 MCG/2ML IJ SOLN
50.0000 ug | Freq: Once | INTRAMUSCULAR | Status: AC
  Administered 2017-04-07: 50 ug via INTRAVENOUS
  Filled 2017-04-07: qty 1

## 2017-04-07 MED ORDER — BACLOFEN 10 MG PO TABS: 10.0000 mg | ORAL_TABLET | Freq: Three times a day (TID) | ORAL | 0 refills | Status: DC | PRN

## 2017-04-07 MED ORDER — SODIUM CHLORIDE 0.9 % IR SOLN
Status: DC | PRN
  Administered 2017-04-07: 6000 mL

## 2017-04-07 MED ORDER — EPHEDRINE 5 MG/ML INJ
INTRAVENOUS | Status: AC
  Filled 2017-04-07: qty 10

## 2017-04-07 MED ORDER — LIDOCAINE 2% (20 MG/ML) 5 ML SYRINGE
INTRAMUSCULAR | Status: DC | PRN
  Administered 2017-04-07: 100 mg via INTRAVENOUS

## 2017-04-07 MED ORDER — MIDAZOLAM HCL 2 MG/2ML IJ SOLN
INTRAMUSCULAR | Status: AC
  Administered 2017-04-07: 1 mg via INTRAVENOUS
  Filled 2017-04-07: qty 2

## 2017-04-07 MED ORDER — OXYCODONE HCL 5 MG PO TABS: 5.0000 mg | ORAL_TABLET | ORAL | Status: DC | PRN

## 2017-04-07 MED ORDER — DEXAMETHASONE SODIUM PHOSPHATE 10 MG/ML IJ SOLN
INTRAMUSCULAR | Status: AC
  Filled 2017-04-07: qty 1

## 2017-04-07 MED ORDER — CEFAZOLIN SODIUM-DEXTROSE 2-4 GM/100ML-% IV SOLN
2.0000 g | Freq: Four times a day (QID) | INTRAVENOUS | Status: AC
  Administered 2017-04-07 – 2017-04-08 (×3): 2 g via INTRAVENOUS
  Filled 2017-04-07 (×3): qty 100

## 2017-04-07 MED ORDER — ACETAMINOPHEN 500 MG PO TABS
1000.0000 mg | ORAL_TABLET | Freq: Four times a day (QID) | ORAL | Status: DC
  Administered 2017-04-07 – 2017-04-08 (×3): 1000 mg via ORAL
  Filled 2017-04-07 (×3): qty 2

## 2017-04-07 MED ORDER — ALBUTEROL SULFATE (2.5 MG/3ML) 0.083% IN NEBU: 2.5000 mg | INHALATION_SOLUTION | Freq: Four times a day (QID) | RESPIRATORY_TRACT | Status: DC | PRN

## 2017-04-07 MED ORDER — POLYETHYLENE GLYCOL 3350 17 G PO PACK: 17.0000 g | PACK | Freq: Every day | ORAL | Status: DC | PRN

## 2017-04-07 MED ORDER — PHENOBARBITAL 32.4 MG PO TABS: 145.8000 mg | ORAL_TABLET | Freq: Every day | ORAL | Status: DC

## 2017-04-07 MED ORDER — OXYCODONE HCL 5 MG PO TABS: 5.0000 mg | ORAL_TABLET | ORAL | 0 refills | Status: DC | PRN

## 2017-04-07 MED ORDER — ONDANSETRON HCL 4 MG PO TABS: 4.0000 mg | ORAL_TABLET | Freq: Four times a day (QID) | ORAL | Status: DC | PRN

## 2017-04-07 MED ORDER — METOCLOPRAMIDE HCL 5 MG/ML IJ SOLN
5.0000 mg | Freq: Three times a day (TID) | INTRAMUSCULAR | Status: DC | PRN
Start: 2017-04-07 — End: 2017-04-08

## 2017-04-07 MED ORDER — ONDANSETRON HCL 4 MG/2ML IJ SOLN
INTRAMUSCULAR | Status: AC
  Filled 2017-04-07: qty 2

## 2017-04-07 MED ORDER — SENNA-DOCUSATE SODIUM 8.6-50 MG PO TABS: 2.0000 | ORAL_TABLET | Freq: Every day | ORAL | 1 refills | Status: DC

## 2017-04-07 MED ORDER — KETOROLAC TROMETHAMINE 15 MG/ML IJ SOLN
7.5000 mg | Freq: Four times a day (QID) | INTRAMUSCULAR | Status: DC
  Administered 2017-04-07 – 2017-04-08 (×3): 7.5 mg via INTRAVENOUS
  Filled 2017-04-07 (×3): qty 1

## 2017-04-07 MED ORDER — PROPOFOL 10 MG/ML IV BOLUS
INTRAVENOUS | Status: DC | PRN
  Administered 2017-04-07: 200 mg via INTRAVENOUS

## 2017-04-07 MED ORDER — ACETAMINOPHEN 325 MG PO TABS: 650.0000 mg | ORAL_TABLET | ORAL | Status: DC | PRN

## 2017-04-07 MED ORDER — SUGAMMADEX SODIUM 500 MG/5ML IV SOLN
INTRAVENOUS | Status: DC | PRN
  Administered 2017-04-07: 300 mg via INTRAVENOUS

## 2017-04-07 MED ORDER — BISACODYL 10 MG RE SUPP: 10.0000 mg | Freq: Every day | RECTAL | Status: DC | PRN

## 2017-04-07 MED ORDER — MIDAZOLAM HCL 2 MG/2ML IJ SOLN
INTRAMUSCULAR | Status: AC
  Filled 2017-04-07: qty 2

## 2017-04-07 MED ORDER — MENTHOL 3 MG MT LOZG
1.0000 | LOZENGE | OROMUCOSAL | Status: DC | PRN
Start: 2017-04-07 — End: 2017-04-08

## 2017-04-07 MED ORDER — PHENOL 1.4 % MT LIQD: 1.0000 | OROMUCOSAL | Status: DC | PRN

## 2017-04-07 MED ORDER — ZOLPIDEM TARTRATE 5 MG PO TABS
5.0000 mg | ORAL_TABLET | Freq: Every evening | ORAL | Status: DC | PRN
  Administered 2017-04-08: 5 mg via ORAL
  Filled 2017-04-07: qty 1

## 2017-04-07 MED ORDER — PROMETHAZINE HCL 25 MG/ML IJ SOLN: 6.2500 mg | INTRAMUSCULAR | Status: DC | PRN

## 2017-04-07 MED ORDER — DEXTROSE 5 % IV SOLN
500.0000 mg | Freq: Four times a day (QID) | INTRAVENOUS | Status: DC | PRN
  Filled 2017-04-07: qty 5

## 2017-04-07 SURGICAL SUPPLY — 72 items
ANCHOR SUT BIO SW 4.75X19.1 (Anchor) ×2 IMPLANT
BLADE CUDA GRT WHITE 3.5 (BLADE) IMPLANT
BLADE CUTTER GATOR 3.5 (BLADE) ×2 IMPLANT
BLADE GREAT WHITE 4.2 (BLADE) IMPLANT
BLADE SURG 15 STRL LF DISP TIS (BLADE) IMPLANT
BLADE SURG 15 STRL SS (BLADE)
BUR OVAL 4.0 (BURR) IMPLANT
BUR OVAL 6.0 (BURR) ×2 IMPLANT
CANISTER OMNI JUG 16 LITER (MISCELLANEOUS) ×2 IMPLANT
CANNULA 5.75X71 LONG (CANNULA) ×2 IMPLANT
CANNULA TWIST IN 8.25X7CM (CANNULA) ×4 IMPLANT
CANNULA TWIST IN 8.25X9CM (CANNULA) IMPLANT
CLSR STERI-STRIP ANTIMIC 1/2X4 (GAUZE/BANDAGES/DRESSINGS) ×2 IMPLANT
DECANTER SPIKE VIAL GLASS SM (MISCELLANEOUS) IMPLANT
DRAPE HALF SHEET 40X57 (DRAPES) ×2 IMPLANT
DRAPE INCISE IOBAN 66X45 STRL (DRAPES) ×2 IMPLANT
DRAPE SHOULDER BEACH CHAIR (DRAPES) ×2 IMPLANT
DRAPE U-SHAPE 47X51 STRL (DRAPES) ×2 IMPLANT
DRSG PAD ABDOMINAL 8X10 ST (GAUZE/BANDAGES/DRESSINGS) ×6 IMPLANT
DURAPREP 26ML APPLICATOR (WOUND CARE) ×2 IMPLANT
ELECT REM PT RETURN 9FT ADLT (ELECTROSURGICAL)
ELECTRODE REM PT RTRN 9FT ADLT (ELECTROSURGICAL) IMPLANT
FIBERSTICK 2 (SUTURE) IMPLANT
GAUZE SPONGE 4X4 12PLY STRL (GAUZE/BANDAGES/DRESSINGS) IMPLANT
GAUZE SPONGE 4X4 12PLY STRL LF (GAUZE/BANDAGES/DRESSINGS) ×2 IMPLANT
GLOVE BIO SURGEON STRL SZ8 (GLOVE) ×2 IMPLANT
GLOVE BIOGEL PI ORTHO PRO SZ8 (GLOVE) ×2
GLOVE ORTHO TXT STRL SZ7.5 (GLOVE) ×2 IMPLANT
GLOVE PI ORTHO PRO STRL SZ8 (GLOVE) ×2 IMPLANT
GOWN STRL REUS W/ TWL XL LVL3 (GOWN DISPOSABLE) IMPLANT
GOWN STRL REUS W/TWL 2XL LVL3 (GOWN DISPOSABLE) ×2 IMPLANT
GOWN STRL REUS W/TWL XL LVL3 (GOWN DISPOSABLE)
IV NS IRRIG 3000ML ARTHROMATIC (IV SOLUTION) ×2 IMPLANT
KIT BASIN OR (CUSTOM PROCEDURE TRAY) ×2 IMPLANT
KIT BIO-TENODESIS 3X8 DISP (MISCELLANEOUS)
KIT INSRT BABSR STRL DISP BTN (MISCELLANEOUS) IMPLANT
KIT SHOULDER TRACTION (DRAPES) ×2 IMPLANT
LASSO CRESCENT QUICKPASS (SUTURE) IMPLANT
LASSO SUT 90 DEGREE (SUTURE) IMPLANT
NDL SUT 6 .5 CRC .975X.05 MAYO (NEEDLE) IMPLANT
NEEDLE MAYO TAPER (NEEDLE)
NEEDLE SCORPION MULTI FIRE (NEEDLE) ×2 IMPLANT
PACK ARTHROSCOPY DSU (CUSTOM PROCEDURE TRAY) ×2 IMPLANT
PENCIL BUTTON HOLSTER BLD 10FT (ELECTRODE) IMPLANT
PROBE BIPOLAR ATHRO 135MM 90D (MISCELLANEOUS) ×2 IMPLANT
SET ARTHROSCOPY TUBING (MISCELLANEOUS) ×1
SET ARTHROSCOPY TUBING LN (MISCELLANEOUS) ×1 IMPLANT
SLING ARM FOAM STRAP LRG (SOFTGOODS) IMPLANT
SLING ARM FOAM STRAP MED (SOFTGOODS) IMPLANT
SLING ARM IMMOBILIZER LRG (SOFTGOODS) IMPLANT
SLING ARM IMMOBILIZER MED (SOFTGOODS) IMPLANT
SLING ARM IMMOBILIZER XL (CAST SUPPLIES) ×2 IMPLANT
SLING ARM XL FOAM STRAP (SOFTGOODS) ×2 IMPLANT
SPONGE LAP 4X18 X RAY DECT (DISPOSABLE) ×2 IMPLANT
SUT FIBERWIRE #2 38 T-5 BLUE (SUTURE)
SUT LASSO 45 DEGREE LEFT (SUTURE) IMPLANT
SUT LASSO 45D RIGHT (SUTURE) IMPLANT
SUT PDS AB 1 CT  36 (SUTURE)
SUT PDS AB 1 CT 36 (SUTURE) IMPLANT
SUT PROLENE 0 CT 1 CR/8 (SUTURE) IMPLANT
SUT TIGER TAPE 7 IN WHITE (SUTURE) IMPLANT
SUT VIC AB 3-0 SH 27 (SUTURE)
SUT VIC AB 3-0 SH 27X BRD (SUTURE) IMPLANT
SUT VIC AB 3-0 SH 8-18 (SUTURE) IMPLANT
SUTURE FIBERWR #2 38 T-5 BLUE (SUTURE) IMPLANT
SYR BULB 3OZ (MISCELLANEOUS) ×2 IMPLANT
TAPE CLOTH SURG 6X10 WHT LF (GAUZE/BANDAGES/DRESSINGS) ×2 IMPLANT
TAPE FIBER 2MM 7IN #2 BLUE (SUTURE) ×2 IMPLANT
TOWEL OR NON WOVEN STRL DISP B (DISPOSABLE) IMPLANT
TUBE CONNECTING 20X1/4 (TUBING) IMPLANT
WAND HAND CNTRL MULTIVAC 90 (MISCELLANEOUS) ×2 IMPLANT
WATER STERILE IRR 1000ML POUR (IV SOLUTION) ×2 IMPLANT

## 2017-04-07 NOTE — Anesthesia Postprocedure Evaluation (Signed)
Anesthesia Post Note  Patient: Misty Foster  Procedure(s) Performed: SHOULDER ARTHROSCOPY WITH ROTATOR CUFF REPAIR, ACROMIOPLASTY AND EXTENSIVE DEBRIDEMENT (Left )     Patient location during evaluation: PACU Anesthesia Type: General Level of consciousness: awake Pain management: pain level controlled Vital Signs Assessment: post-procedure vital signs reviewed and stable Respiratory status: spontaneous breathing Cardiovascular status: stable Postop Assessment: no apparent nausea or vomiting Anesthetic complications: no    Last Vitals:  Vitals:   04/07/17 1925 04/07/17 1955  BP: 113/67 114/70  Pulse: 85 85  Resp: 20 18  Temp:    SpO2: 97% 98%    Last Pain:  Vitals:   04/07/17 1955  TempSrc:   PainSc: 0-No pain   Pain Goal:                 [place] Nolton JR,JOHN Usama Harkless

## 2017-04-07 NOTE — Progress Notes (Signed)
Orthopedic Tech Progress Note Patient Details:  Misty PraderCatrice Jenkins Foster May 29, 1970 [id number] Patient unable to use overhead frame due to shoulder surgery. Patient ID: Misty Foster, female   DOB: May 29, 1970, 46 y.o.   MRN: [id number]   Misty Foster, Misty Foster 04/07/2017, 8:24 PM

## 2017-04-07 NOTE — Anesthesia Procedure Notes (Addendum)
Anesthesia Regional Block: Interscalene brachial plexus block   Pre-Anesthetic Checklist: ,, timeout performed, Correct Patient, Correct Site, Correct Laterality, Correct Procedure, Correct Position, site marked, Risks and benefits discussed,  Surgical consent,  Pre-op evaluation,  At surgeon's request and post-op pain management  Laterality: Left and Upper  Prep: chloraprep       Needles:  Injection technique: Single-shot  Needle Type: Echogenic Stimulator Needle     Needle Length: 9cm  Needle Gauge: 21   Needle insertion depth: 2 cm   Additional Needles:   Procedures:,,,, ultrasound used (permanent image in chart),,,,  Narrative:  Start time: 04/07/2017 3:45 PM End time: 04/07/2017 3:55 PM Injection made incrementally with aspirations every 5 mL.  Performed by: Personally  Anesthesiologist: Leilani AbleHatchett, Michala Deblanc, MD

## 2017-04-07 NOTE — Progress Notes (Addendum)
Received patient from PACU, assessment completed, VS documented, oriented patient to the room. Snack provided.  SCDs placed on patient. Will continue to monitor.

## 2017-04-07 NOTE — Transfer of Care (Signed)
Immediate Anesthesia Transfer of Care Note  Patient: Misty Foster  Procedure(s) Performed: SHOULDER ARTHROSCOPY WITH ROTATOR CUFF REPAIR, ACROMIOPLASTY AND EXTENSIVE DEBRIDEMENT (Left )  Patient Location: PACU  Anesthesia Type:General  Level of Consciousness: drowsy  Airway & Oxygen Therapy: Patient Spontanous Breathing and Patient connected to face mask oxygen  Post-op Assessment: Report given to RN and Post -op Vital signs reviewed and stable  Post vital signs: Reviewed and stable  Last Vitals:  Vitals:   04/07/17 1555 04/07/17 1600  BP:    Pulse: 77 75  Resp: 16 18  Temp:    SpO2: 100% 100%    Last Pain:  Vitals:   04/07/17 1137  TempSrc: Oral         Complications: No apparent anesthesia complications

## 2017-04-07 NOTE — Discharge Instructions (Signed)

## 2017-04-07 NOTE — Anesthesia Preprocedure Evaluation (Signed)
Anesthesia Evaluation  Patient identified by MRN, date of birth, ID band  Reviewed: Allergy & Precautions, NPO status , Patient's Chart, lab work & pertinent test results  Airway Mallampati: III       Dental no notable dental hx. (+) Teeth Intact   Pulmonary Current Smoker,    Pulmonary exam normal breath sounds clear to auscultation       Cardiovascular Normal cardiovascular exam Rhythm:Regular Rate:Normal     Neuro/Psych negative psych ROS   GI/Hepatic negative GI ROS, Neg liver ROS,   Endo/Other  Morbid obesity  Renal/GU negative Renal ROS  negative genitourinary   Musculoskeletal   Abdominal (+) + obese,   Peds  Hematology negative hematology ROS (+)   Anesthesia Other Findings   Reproductive/Obstetrics negative OB ROS                             Anesthesia Physical Anesthesia Plan  ASA: III  Anesthesia Plan: General   Post-op Pain Management:  Regional for Post-op pain   Induction: Intravenous  PONV Risk Score and Plan: 2 and Dexamethasone and Ondansetron  Airway Management Planned: Oral ETT  Additional Equipment:   Intra-op Plan:   Post-operative Plan: Extubation in OR  Informed Consent: I have reviewed the patients History and Physical, chart, labs and discussed the procedure including the risks, benefits and alternatives for the proposed anesthesia with the patient or authorized representative who has indicated his/her understanding and acceptance.     Plan Discussed with: CRNA and Surgeon  Anesthesia Plan Comments:         Anesthesia Quick Evaluation

## 2017-04-07 NOTE — Anesthesia Procedure Notes (Addendum)
Procedure Name: Intubation Date/Time: 04/07/2017 4:20 PM Performed by: Bryson Corona, CRNA Pre-anesthesia Checklist: Patient identified, Emergency Drugs available, Suction available and Patient being monitored Patient Re-evaluated:Patient Re-evaluated prior to induction Oxygen Delivery Method: Circle System Utilized Preoxygenation: Pre-oxygenation with 100% oxygen Induction Type: IV induction Ventilation: Oral airway inserted - appropriate to patient size and Two handed mask ventilation required Laryngoscope Size: Mac and 4 Grade View: Grade I Tube type: Oral Tube size: 7.0 mm Number of attempts: 1 Airway Equipment and Method: Stylet and Oral airway Placement Confirmation: ETT inserted through vocal cords under direct vision,  positive ETCO2 and breath sounds checked- equal and bilateral Secured at: 21 cm Tube secured with: Tape Dental Injury: Teeth and Oropharynx as per pre-operative assessment

## 2017-04-07 NOTE — Op Note (Signed)
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  PROCEDURE: Left shoulder arthroscopy with extensive debridement, takedown of supraspinatus 60% tear, labral debridement, chondroplasty of the glenoid, with acromioplasty and bursectomy.  SURGEON:  Eulas PostJoshua P Arina Torry, MD  PHYSICIAN ASSISTANT: Janace LittenBrandon Parry, OPA-C, present and scrubbed throughout the case, critical for completion in a timely fashion, and for retraction, instrumentation, and closure.  ANESTHESIA:   General with regional block  PREOPERATIVE INDICATIONS:  Misty Foster Misty Foster is a  46 y.o. female with a diagnosis of LEFT SHOULDER pain with significant difficulty with daily function, who failed conservative measures and elected for surgical management.    The risks benefits and alternatives were discussed with the patient preoperatively including but not limited to the risks of infection, bleeding, nerve injury, cardiopulmonary complications, the need for revision surgery, among others, and the patient was willing to proceed.  We also discussed the potential risk for incomplete relief of pain, recurrent rotator cuff tear, progression of shoulder arthritis, among others.  ESTIMATED BLOOD LOSS: Minimal  OPERATIVE IMPLANTS: Arthrex bio composite 4.75 mm swivel lock x1 with an inverted fiber tape  OPERATIVE FINDINGS: Shoulder had full motion during examination under anesthesia.  The glenohumeral articular cartilage was still in reasonably good condition, although there was a small amount of chondromalacia anteriorly which was debrided with the shaver.   Labrum was all intact although there was some labral degeneration superiorly as well as posteriorly.  There was a 60% undersurface deep supraspinatus tear on the articular side, it did not transfer to the bursal side, but I did take it down from above.  The biceps tendon was intact, and subscapularis and biceps pulley was intact.  From the bursal side there was thickening bursa with hemorrhagic injection as well as very mild subacromial CA ligament fraying, not very significant, there is no evidence for substantial spurring along the acromioclavicular joint, so I did not take down the capsule or do a distal clavicle resection.  The tendon quality was reasonably good, but there was hemorrhagic injection from above.  OPERATIVE PROCEDURE: The patient was brought to the operating room and placed in the supine position.  General anesthesia was administered.  IV antibiotics was given.  She was placed in the beachchair position with some difficulty due to her morbid obesity and body habitus.  The left upper extremity was prepped and draped in usual sterile fashion.  Timeout performed.  Diagnostic arthroscopy was carried out with the above named findings.  I used the arthroscopic shaver to debride the superior labrum as well as the undersurface of the rotator cuff, at the location of high-grade partial tearing, and also used the shaver to debride the anterior portion of the glenoid cartilage, although there is no real full-thickness lesions and no evidence that she is heading towards arthroplasty clinically.  After debriding the undersurface of the cuff, I went to the subacromial space, I did place a spinal needle through the rotator cuff tear in order to tag it from above, and then from the subacromial space I performed a complete bursectomy, CA ligament release, and light acromioplasty.  There was really not that much substantial subacromial spurring.  Given the size  of the tear from below, I elected to go ahead  and remove the area of weakness tendon at the lateral margin, I did this with a shaver as well as a small amount of ArthroCare and then used a spatula.  Once I had access to the full-thickness cuff tear, I performed a light tuboplasty with a bur, and then used the scorpion suture passer to place an inverted fiber tape.  I anchored this with a 4.75 mm swivel lock from laterally, and had excellent fixation and restoration of cuff tension.  The sutures were cut, the acromioplasty confirmed from the lateral portal, the distal clavicle was left intact, and the portals closed with Monocryl followed by Steri-Strips and sterile gauze.  She was awakened and returned to the PACU in stable and satisfactory condition.  There were no complications and she tolerated the procedure well.

## 2017-04-07 NOTE — H&P (Signed)
PREOPERATIVE H&P  Chief Complaint: LEFT SHOULDER pain  HPI: Misty Foster is a 46 y.o. female who presents for preoperative history and physical with a diagnosis of LEFT SHOULDER pain with partial thickness cuff tear. Symptoms are rated as moderate to severe, and have been worsening.  This is significantly impairing activities of daily living.  She has elected for surgical management.   She has had a total of 3 injections, also used anti-inflammatories, and has had progressive lateral sided pain with difficulty lifting the arm.  She has had previous cervical and lumbar problems, but does not feel like this is coming from her spine.  Past Medical History:  Diagnosis Date  . Arthritis   . Asthma   . Back pain   . Chronic back pain   . Seizures (HCC)   . 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 ABLATION     cervical  . CESAREAN SECTION     x3   Social History   Socioeconomic History  . Marital status: Legally Separated    Spouse name: None  . Number of children: None  . Years of education: None  . Highest education level: None  Social Needs  . Financial resource strain: None  . Food insecurity - worry: None  . Food insecurity - inability: None  . Transportation needs - medical: None  . Transportation needs - non-medical: None  Occupational History  . None  Tobacco Use  . Smoking status: Light Tobacco Smoker    Packs/day: 0.25    Types: Cigarettes  . Smokeless tobacco: Never Used  Substance and Sexual Activity  . Alcohol use: No  . Drug use: No  . Sexual activity: None  Other Topics Concern  . None  Social History Narrative  . None   Family History  Problem Relation Age of Onset  . Breast cancer Maternal Grandmother    Allergies  Allergen Reactions  . Hydrocodone-Acetaminophen Other (See Comments)    Auras  . Lamictal [Lamotrigine] Nausea And Vomiting and Rash  . Gabapentin Other (See Comments)    auras    Prior to Admission medications   Medication Sig Start Date End Date Taking? Authorizing Provider  acetaminophen-codeine (TYLENOL #3) 300-30 MG tablet Take 1-2 tablets by mouth 2 (two) times daily as needed for moderate pain or severe pain (depends on pain level if takes 1-2 tablets).  12/08/16  Yes [provider]  albuterol (VENTOLIN HFA) 108 (90 Base) MCG/ACT inhaler Inhale 2 puffs into the lungs every 6 (six) hours as needed for wheezing or shortness of breath.  09/13/15  Yes [provider]  baclofen (LIORESAL) 10 MG tablet Take 10-20 mg by mouth at bedtime. Depends on pain level if takes 1-2 tablets 05/03/16  Yes [provider]  Calcium Carb-Cholecalciferol (CALCIUM 600 + D PO) Take 1 tablet by mouth daily.   Yes [provider]  ibuprofen (ADVIL,MOTRIN) 200 MG tablet Take 400 mg by mouth every 8 (eight) hours as needed for headache or mild pain.   Yes [provider]  lidocaine (LIDODERM) 5 % Place 1 patch onto the skin daily. Remove & Discard patch within 12 hours or as directed by MD Patient taking differently: Place 1-2 patches onto the skin daily. Remove & Discard patch within 12 hours or as directed by MD 07/22/16  Yes Molpus, Jonny RuizJohn, MD  PHENobarbital (LUMINAL) 97.2 MG tablet Take 1.5 tablets (145.8 mg total) by mouth every evening. 12/18/16  Yes Van ClinesAquino, Karen M, MD  metroNIDAZOLE (FLAGYL) 500 MG tablet Take 1 tablet (500 mg total) by mouth 2 (two) times daily. Patient not taking: Reported on 03/26/2017 10/03/16   Linwood DibblesKnapp, Jon, MD  Vitamin D, Ergocalciferol, (DRISDOL) 50000 units CAPS capsule Take one capsule once a week for 8 weeks. Patient not taking: Reported on 08/30/2016 06/19/16   Van ClinesAquino, Karen M, MD     Positive ROS: All other systems have been reviewed and were otherwise negative with the exception of those mentioned in the HPI and as above.  Physical Exam: General: Alert, no acute distress Cardiovascular: No pedal edema Respiratory: No  cyanosis, no use of accessory musculature GI: No organomegaly, abdomen is soft and non-tender Skin: No lesions in the area of chief complaint Neurologic: Sensation intact distally Psychiatric: Patient is competent for consent with normal mood and affect Lymphatic: No axillary or cervical lymphadenopathy  MUSCULOSKELETAL: Left shoulder active motion 0-90 degrees of external rotation to neutral.  AC joint is equivocal, she has pain essentially everywhere.  Positive impingement signs and weakness with all types of motion.  Assessment: Left shoulder partial-thickness rotator cuff tear with impingement syndrome, questionable involvement of her AC joint, with coexisting moderate glenohumeral joint changes in the 46 year old woman.   Plan: Plan for Procedure(s): SHOULDER ARTHROSCOPY with extensive debridement, possible cuff repair, evaluation of her glenohumeral articular cartilage, possible acromioplasty and distal clavicle resection depending on operative findings.  The risks benefits and alternatives were discussed with the patient including but not limited to the risks of nonoperative treatment, versus surgical intervention including infection, bleeding, nerve injury,  blood clots, cardiopulmonary complications, morbidity, mortality, among others, and they were willing to proceed.  We have also discussed at length that there is a possibility that surgical intervention will not improve her symptoms, or that she may need future arthroplasty at some point, however she is very young to resort to this at this point.  Eulas PostJoshua P Ozias Dicenzo, MD Cell [phone]   04/07/2017 3:12 PM

## 2017-04-08 ENCOUNTER — Encounter (HOSPITAL_COMMUNITY): Payer: Self-pay | Admitting: Orthopedic Surgery

## 2017-04-08 DIAGNOSIS — J45909 Unspecified asthma, uncomplicated: Secondary | ICD-10-CM | POA: Diagnosis not present

## 2017-04-08 DIAGNOSIS — Z79899 Other long term (current) drug therapy: Secondary | ICD-10-CM | POA: Diagnosis not present

## 2017-04-08 DIAGNOSIS — R269 Unspecified abnormalities of gait and mobility: Secondary | ICD-10-CM | POA: Diagnosis not present

## 2017-04-08 DIAGNOSIS — M1990 Unspecified osteoarthritis, unspecified site: Secondary | ICD-10-CM | POA: Diagnosis not present

## 2017-04-08 DIAGNOSIS — M75112 Incomplete rotator cuff tear or rupture of left shoulder, not specified as traumatic: Secondary | ICD-10-CM | POA: Diagnosis not present

## 2017-04-08 DIAGNOSIS — G8929 Other chronic pain: Secondary | ICD-10-CM | POA: Diagnosis not present

## 2017-04-08 DIAGNOSIS — Q851 Tuberous sclerosis: Secondary | ICD-10-CM | POA: Diagnosis not present

## 2017-04-08 DIAGNOSIS — R69 Illness, unspecified: Secondary | ICD-10-CM | POA: Diagnosis not present

## 2017-04-08 DIAGNOSIS — M7542 Impingement syndrome of left shoulder: Secondary | ICD-10-CM | POA: Diagnosis not present

## 2017-04-08 NOTE — Care Management Note (Signed)
Case Management Note  Patient Details  Name: Misty Foster MRN: [id number] Date of Birth: [birth year]  Subjective/Objective:    S/p I & D of left shoulder rotator cuff  Action/Plan: Case manager requested DMe. No HH needs identified.    Expected Discharge Date:  04/08/17               Expected Discharge Plan:  Home/Self Care  In-House Referral:     Discharge planning Services  CM Consult  Post Acute Care Choice:  Durable Medical Equipment Choice offered to:  NA  DME Arranged:  3-N-1(bariatric) DME Agency:  [facility]  HH Arranged:  NA HH Agency:  NA  Status of Service:  Completed, signed off  If discussed at Long Length of Stay Meetings, dates discussed:    Additional Comments:  Durenda GuthrieBrady, Dmetrius Ambs Naomi, RN 04/08/2017, 11:07 AM

## 2017-04-08 NOTE — Discharge Summary (Signed)
Physician Discharge Summary  Patient ID: Misty Foster MRN: [id number] DOB/AGE: July 18, 1970 46 y.o.  Admit date: 04/07/2017 Discharge date: 04/08/2017  Admission Diagnoses:  Incomplete rotator cuff tear or rupture of left shoulder, not specified as traumatic  Discharge Diagnoses:  Principal Problem:   Incomplete rotator cuff tear or rupture of left shoulder, not specified as traumatic Active Problems:   Left rotator cuff tear   Past Medical History:  Diagnosis Date  . Arthritis   . Asthma   . Back pain   . Chronic back pain   . Incomplete rotator cuff tear or rupture of left shoulder, not specified as traumatic 04/07/2017  . Seizures (HCC)   . Tuberous sclerosis (HCC)     Surgeries: Procedure(s): SHOULDER ARTHROSCOPY WITH ROTATOR CUFF REPAIR, ACROMIOPLASTY AND EXTENSIVE DEBRIDEMENT on 04/07/2017   Consultants (if any):   Discharged Condition: Improved  Hospital Course: Phinley Ladoris GeneJenkins Bulnes is an 46 y.o. female who was admitted 04/07/2017 with a diagnosis of Incomplete rotator cuff tear or rupture of left shoulder, not specified as traumatic and went to the operating room on 04/07/2017 and underwent the above named procedures.    She was given perioperative antibiotics:  Anti-infectives (From admission, onward)   Start     Dose/Rate Route Frequency Ordered Stop   04/07/17 2030  ceFAZolin (ANCEF) IVPB 2g/100 mL premix     2 g 200 mL/hr over 30 Minutes Intravenous Every 6 hours 04/07/17 2015 04/08/17 1659   04/07/17 1430  ceFAZolin (ANCEF) 3 g in dextrose 5 % 50 mL IVPB     3 g 130 mL/hr over 30 Minutes Intravenous To [facility] 04/06/17 1212 04/07/17 1625    .  She was given sequential compression devices, early ambulation,  for DVT prophylaxis.  She benefited maximally from the hospital stay and there were no complications.    Recent vital signs:  Vitals:   04/08/17 0232 04/08/17 0646  BP: 97/65 (!) 111/58  Pulse: 77 61  Resp: 16 16  Temp:  98 F (36.7 C) 97.8 F (36.6 C)  SpO2: 97% 100%    Recent laboratory studies:  Lab Results  Component Value Date   HGB 12.5 03/31/2017   HGB 11.2 (L) 05/07/2016   HGB 10.6 (L) 11/21/2015   Lab Results  Component Value Date   WBC 6.5 03/31/2017   PLT 216 03/31/2017   No results found for: INR Lab Results  Component Value Date   NA 140 05/07/2016   K 3.6 05/07/2016   CL 106 05/07/2016   CO2 27 05/07/2016   BUN 7 05/07/2016   CREATININE 0.69 05/07/2016   GLUCOSE 112 (H) 05/07/2016    Discharge Medications:   Allergies as of 04/08/2017      Reactions   Hydrocodone-acetaminophen Other (See Comments)   Auras   Lamictal [lamotrigine] Nausea And Vomiting, Rash   Gabapentin Other (See Comments)   auras      Medication List    STOP taking these medications   acetaminophen-codeine 300-30 MG tablet Commonly known as:  TYLENOL #3   ibuprofen 200 MG tablet Commonly known as:  ADVIL,MOTRIN   metroNIDAZOLE 500 MG tablet Commonly known as:  FLAGYL   Vitamin D (Ergocalciferol) 50000 units Caps capsule Commonly known as:  DRISDOL     TAKE these medications   baclofen 10 MG tablet Commonly known as:  LIORESAL Take 1-2 tablets (10-20 mg total) by mouth 3 (three) times daily as needed for muscle spasms. Depends on pain level if takes  1-2 tablets What changed:    when to take this  reasons to take this   CALCIUM 600 + D PO Take 1 tablet by mouth daily.   lidocaine 5 % Commonly known as:  LIDODERM Place 1 patch onto the skin daily. Remove & Discard patch within 12 hours or as directed by MD What changed:    how much to take  additional instructions   ondansetron 4 MG tablet Commonly known as:  ZOFRAN Take 1 tablet (4 mg total) by mouth every 8 (eight) hours as needed for nausea or vomiting.   oxyCODONE 5 MG immediate release tablet Commonly known as:  ROXICODONE Take 1-2 tablets (5-10 mg total) by mouth every 4 (four) hours as needed for severe pain.    PHENobarbital 97.2 MG tablet Commonly known as:  LUMINAL Take 1.5 tablets (145.8 mg total) by mouth every evening.   sennosides-docusate sodium 8.6-50 MG tablet Commonly known as:  SENOKOT-S Take 2 tablets by mouth daily.   VENTOLIN HFA 108 (90 Base) MCG/ACT inhaler Generic drug:  albuterol Inhale 2 puffs into the lungs every 6 (six) hours as needed for wheezing or shortness of breath.       Diagnostic Studies: No results found.  Disposition: 01-Home or Self Care    Follow-up Information    Teryl LucyLandau, Advith Martine, MD. Schedule an appointment as soon as possible for a visit in 2 weeks.   Specialty:  Orthopedic Surgery Contact information: [address] [place] [address] [phone]            Signed: Eulas PostJoshua P Rhodesia Stanger 04/08/2017, 7:32 AM

## 2017-04-08 NOTE — Progress Notes (Signed)
PT Cancellation and Discharge Note  Patient Details Name: Misty Foster More MRN: [id number] DOB: 10-02-1970   Cancelled Treatment:    Reason Eval/Treat Not Completed: PT screened, no needs identified, will sign off   Discussed pt with OT, who indicated pt transfers and walks without difficulty;   Van ClinesHolly Kaley Jutras, PT  Acute Rehabilitation Services Pager [phone] Office [phone]    Levi AlandHolly H Herbert Marken 04/08/2017, 1:21 PM

## 2017-04-08 NOTE — Evaluation (Signed)
Occupational Therapy Evaluation Patient Details Name: Misty Foster MRN: [id number] DOB: 02/01/1971 Today's Date: 04/08/2017    History of Present Illness 46 y.o. female presenting s/p L total shoulder with conservative protocal after incomplete rotator cuff tear. PHM arthrtis, seizures, tuberous sclerosis, and chonic back pain.   Clinical Impression   PTA, pt was living alone and was independent. Pt planning for mother to stay with her at dc. Currently, pt requires Mod-Max A for UB ADLs and sling management. Provided education on shoulder precautions, ROM limitations, compensatory techniques for UB ADLs, sling management, and exercises. Pt and pt's mother demonstrated understanding. Answered all pt questions. Recommend dc home once medically stable per physician. All acute OT needs met and will sign off. Thank you.     Follow Up Recommendations  No OT follow up;Supervision/Assistance - 24 hour    Equipment Recommendations  3 in 1 bedside commode    Recommendations for Other Services       Precautions / Restrictions Precautions Precautions: Shoulder Type of Shoulder Precautions: Conservative protocal with no passive or active ROM. WFL hand, wrist, and elbow.  Shoulder Interventions: Shoulder sling/immobilizer;Off for dressing/bathing/exercises Precaution Booklet Issued: Yes (comment) Precaution Comments: Reviewed all shoulder precautions, ROM limitations, ADLs, sling management, and comensatory techniques. Required Braces or Orthoses: Sling Restrictions Weight Bearing Restrictions: Yes LUE Weight Bearing: Non weight bearing Other Position/Activity Restrictions: No shoulder ROM      Mobility Bed Mobility Overal bed mobility: Independent             General bed mobility comments: Educated on safe tehcnique for shoulder  Transfers Overall transfer level: Independent                    Balance Overall balance assessment: No apparent balance deficits  (not formally assessed)                                         ADL either performed or assessed with clinical judgement   ADL Overall ADL's : Needs assistance/impaired Eating/Feeding: Set up;Sitting   Grooming: Set up;Standing;Supervision/safety   Upper Body Bathing: Minimal assistance;Sitting Upper Body Bathing Details (indicate cue type and reason): Educated pt on compensatory techniques for  Lower Body Bathing: Sit to/from stand;Set up;Supervison/ safety Lower Body Bathing Details (indicate cue type and reason): Pt able to perform LB sponge bath and peri care while standing at sink with supervision Upper Body Dressing : Moderate assistance;Sitting;Maximal assistance Upper Body Dressing Details (indicate cue type and reason): Pt requiring Mod-Max A for UB dressing to don shirt and sling. pt and mother demonstrating understanding with cues for correct positioning. Lower Body Dressing: Sit to/from stand;Set up;Supervision/safety Lower Body Dressing Details (indicate cue type and reason): donned pants with min guard Toilet Transfer: Set up;Supervision/safety;Ambulation Toilet Transfer Details (indicate cue type and reason): simulated to chair         Functional mobility during ADLs: Supervision/safety General ADL Comments: Pt demonstrating good funcitonal performance post surgery. Providing education on shoulder precautions, excerises, sling managment, positioning, UB ADLs, and one handed LB ADLs.     Vision         Perception     Praxis      Pertinent Vitals/Pain Pain Assessment: Faces Faces Pain Scale: Hurts little more Pain Location: L shoulder Pain Descriptors / Indicators: Constant;Discomfort;Grimacing Pain Intervention(s): Monitored during session;Limited activity within patient's tolerance;Repositioned;Premedicated before session  Hand Dominance Right   Extremity/Trunk Assessment Upper Extremity Assessment Upper Extremity Assessment: LUE  deficits/detail LUE Deficits / Details: s/p L total shoulder. No Passive or active ROM shoulder. WFL elbow, wrist, and hand LUE: Unable to fully assess due to immobilization LUE Coordination: decreased gross motor   Lower Extremity Assessment Lower Extremity Assessment: Overall WFL for tasks assessed   Cervical / Trunk Assessment Cervical / Trunk Assessment: Other exceptions Cervical / Trunk Exceptions: increase body habitus   Communication Communication Communication: No difficulties   Cognition Arousal/Alertness: Awake/alert Behavior During Therapy: WFL for tasks assessed/performed Overall Cognitive Status: Within Functional Limits for tasks assessed                                     General Comments       Exercises Exercises: Shoulder Shoulder Exercises Elbow Flexion: AAROM;Left;15 reps;Seated Elbow Extension: AAROM;Left;10 reps;Seated Wrist Flexion: AROM;15 reps;Seated;Left Wrist Extension: AROM;Left;15 reps;Seated Digit Composite Flexion: AROM;Left;15 reps;Seated Composite Extension: AROM;Both;15 reps;Seated Neck Flexion: AROM Neck Extension: AROM Neck Lateral Flexion - Right: AROM Neck Lateral Flexion - Left: AROM   Shoulder Instructions Shoulder Instructions Donning/doffing shirt without moving shoulder: Caregiver independent with task;Patient able to independently direct caregiver;Moderate assistance Method for sponge bathing under operated UE: Moderate assistance;Caregiver independent with task;Patient able to independently direct caregiver Donning/doffing sling/immobilizer: Maximal assistance;Caregiver independent with task;Patient able to independently direct caregiver Correct positioning of sling/immobilizer: Moderate assistance;Caregiver independent with task;Patient able to independently direct caregiver ROM for elbow, wrist and digits of operated UE: Minimal assistance Sling wearing schedule (on at all times/off for ADL's):  Independent Positioning of UE while sleeping: Minimal assistance    Home Living Family/patient expects to be discharged to:: Private residence Living Arrangements: Alone Available Help at Discharge: Family;Available 24 hours/day(Mother coming to stay at dc) Type of Home: Apartment Home Access: Stairs to enter Entrance Stairs-Number of Steps: 4   Home Layout: One level     Bathroom Shower/Tub: Tub/shower unit;Walk-in shower   Bathroom Toilet: Standard     Home Equipment: None          Prior Functioning/Environment Level of Independence: Independent                 OT Problem List: Decreased range of motion;Decreased knowledge of use of DME or AE;Decreased knowledge of precautions;Pain;Impaired UE functional use      OT Treatment/Interventions:      OT Goals(Current goals can be found in the care plan section) Acute Rehab OT Goals Patient Stated Goal: Go back to work OT Goal Formulation: With patient Time For Goal Achievement: 04/22/17 Potential to Achieve Goals: Good  OT Frequency:     Barriers to D/C:            Co-evaluation              AM-PAC PT "6 Clicks" Daily Activity     Outcome Measure Help from another person eating meals?: None Help from another person taking care of personal grooming?: None Help from another person toileting, which includes using toliet, bedpan, or urinal?: A Little Help from another person bathing (including washing, rinsing, drying)?: A Little Help from another person to put on and taking off regular upper body clothing?: A Lot Help from another person to put on and taking off regular lower body clothing?: A Little 6 Click Score: 19   End of Session Equipment Utilized During Treatment: Other (comment)(sling) Nurse Communication:  Mobility status  Activity Tolerance: Patient tolerated treatment well Patient left: in chair;with call bell/phone within reach;with family/visitor present  OT Visit Diagnosis: Other  abnormalities of gait and mobility (R26.89);Pain Pain - Right/Left: Left Pain - part of body: Shoulder                Time: 0923-1000 OT Time Calculation (min): 37 min Charges:  OT General Charges $OT Visit: 1 Visit OT Evaluation $OT Eval Low Complexity: 1 Low OT Treatments $Self Care/Home Management : 8-22 mins G-Codes: OT G-codes **NOT FOR INPATIENT CLASS** Functional Assessment Tool Used: Clinical judgement Functional Limitation: Self care Self Care Current Status (V7616): At least 1 percent but less than 20 percent impaired, limited or restricted Self Care Goal Status (W7371): At least 40 percent but less than 60 percent impaired, limited or restricted Self Care Discharge Status [phone]): At least 1 percent but less than 20 percent impaired, limited or restricted   [place], OTR/L Acute Rehab Pager: [phone] Office: [place] 04/08/2017, 1:14 PM

## 2017-04-13 ENCOUNTER — Encounter (HOSPITAL_COMMUNITY): Payer: Self-pay | Admitting: Emergency Medicine

## 2017-04-13 ENCOUNTER — Emergency Department (HOSPITAL_COMMUNITY): Payer: Managed Care, Other (non HMO)

## 2017-04-13 ENCOUNTER — Other Ambulatory Visit: Payer: Self-pay

## 2017-04-13 ENCOUNTER — Emergency Department (HOSPITAL_COMMUNITY)
Admission: EM | Admit: 2017-04-13 | Discharge: 2017-04-13 | Disposition: A | Discharge status: Home / Self Care | Payer: Managed Care, Other (non HMO) | Attending: Emergency Medicine | Admitting: Emergency Medicine

## 2017-04-13 DIAGNOSIS — J45909 Unspecified asthma, uncomplicated: Secondary | ICD-10-CM | POA: Diagnosis not present

## 2017-04-13 DIAGNOSIS — M5416 Radiculopathy, lumbar region: Secondary | ICD-10-CM | POA: Insufficient documentation

## 2017-04-13 DIAGNOSIS — R202 Paresthesia of skin: Secondary | ICD-10-CM | POA: Diagnosis not present

## 2017-04-13 DIAGNOSIS — R200 Anesthesia of skin: Secondary | ICD-10-CM | POA: Diagnosis not present

## 2017-04-13 DIAGNOSIS — L299 Pruritus, unspecified: Secondary | ICD-10-CM | POA: Insufficient documentation

## 2017-04-13 DIAGNOSIS — Z9889 Other specified postprocedural states: Secondary | ICD-10-CM | POA: Diagnosis not present

## 2017-04-13 DIAGNOSIS — F17210 Nicotine dependence, cigarettes, uncomplicated: Secondary | ICD-10-CM | POA: Diagnosis not present

## 2017-04-13 DIAGNOSIS — R21 Rash and other nonspecific skin eruption: Secondary | ICD-10-CM | POA: Diagnosis not present

## 2017-04-13 DIAGNOSIS — R69 Illness, unspecified: Secondary | ICD-10-CM | POA: Diagnosis not present

## 2017-04-13 LAB — URINALYSIS, ROUTINE W REFLEX MICROSCOPIC
BILIRUBIN URINE: NEGATIVE
Glucose, UA: NEGATIVE mg/dL
Hgb urine dipstick: NEGATIVE
KETONES UR: NEGATIVE mg/dL
LEUKOCYTES UA: NEGATIVE
Nitrite: NEGATIVE
PROTEIN: NEGATIVE mg/dL
Specific Gravity, Urine: 1.006 (ref 1.005–1.030)
pH: 7 (ref 5.0–8.0)

## 2017-04-13 LAB — APTT: aPTT: 31 seconds (ref 24–36)

## 2017-04-13 LAB — I-STAT CHEM 8, ED
BUN: 8 mg/dL (ref 6–20)
CHLORIDE: 100 mmol/L — AB (ref 101–111)
Calcium, Ion: 1.23 mmol/L (ref 1.15–1.40)
Creatinine, Ser: 0.7 mg/dL (ref 0.44–1.00)
Glucose, Bld: 104 mg/dL — ABNORMAL HIGH (ref 65–99)
HEMATOCRIT: 40 % (ref 36.0–46.0)
HEMOGLOBIN: 13.6 g/dL (ref 12.0–15.0)
POTASSIUM: 4 mmol/L (ref 3.5–5.1)
SODIUM: 140 mmol/L (ref 135–145)
TCO2: 30 mmol/L (ref 22–32)

## 2017-04-13 LAB — DIFFERENTIAL
BASOS PCT: 0 %
Basophils Absolute: 0 10*3/uL (ref 0.0–0.1)
EOS PCT: 3 %
Eosinophils Absolute: 0.2 10*3/uL (ref 0.0–0.7)
LYMPHS PCT: 28 %
Lymphs Abs: 2 10*3/uL (ref 0.7–4.0)
Monocytes Absolute: 0.6 10*3/uL (ref 0.1–1.0)
Monocytes Relative: 8 %
NEUTROS ABS: 4.4 10*3/uL (ref 1.7–7.7)
NEUTROS PCT: 61 %

## 2017-04-13 LAB — I-STAT TROPONIN, ED: Troponin i, poc: 0 ng/mL (ref 0.00–0.08)

## 2017-04-13 LAB — COMPREHENSIVE METABOLIC PANEL
ALBUMIN: 3.1 g/dL — AB (ref 3.5–5.0)
ALK PHOS: 85 U/L (ref 38–126)
ALT: 13 U/L — ABNORMAL LOW (ref 14–54)
ANION GAP: 9 (ref 5–15)
AST: 19 U/L (ref 15–41)
BUN: 7 mg/dL (ref 6–20)
CALCIUM: 9.5 mg/dL (ref 8.9–10.3)
CHLORIDE: 102 mmol/L (ref 101–111)
CO2: 27 mmol/L (ref 22–32)
Creatinine, Ser: 0.62 mg/dL (ref 0.44–1.00)
GFR calc Af Amer: >60 mL/min (ref >60)
GFR calc non Af Amer: >60 mL/min (ref >60)
GLUCOSE: 99 mg/dL (ref 65–99)
Potassium: 3.9 mmol/L (ref 3.5–5.1)
SODIUM: 138 mmol/L (ref 135–145)
Total Bilirubin: 0.4 mg/dL (ref 0.3–1.2)
Total Protein: 7.4 g/dL (ref 6.5–8.1)

## 2017-04-13 LAB — CBC
HCT: 38.3 % (ref 36.0–46.0)
Hemoglobin: 12.6 g/dL (ref 12.0–15.0)
MCH: 30.7 pg (ref 26.0–34.0)
MCHC: 32.9 g/dL (ref 30.0–36.0)
MCV: 93.4 fL (ref 78.0–100.0)
PLATELETS: 226 10*3/uL (ref 150–400)
RBC: 4.1 MIL/uL (ref 3.87–5.11)
RDW: 13.1 % (ref 11.5–15.5)
WBC: 7.1 10*3/uL (ref 4.0–10.5)

## 2017-04-13 LAB — I-STAT BETA HCG BLOOD, ED (MC, WL, AP ONLY)

## 2017-04-13 LAB — PROTIME-INR
INR: 1.03
PROTHROMBIN TIME: 13.5 s (ref 11.4–15.2)

## 2017-04-13 LAB — CBG MONITORING, ED: GLUCOSE-CAPILLARY: 128 mg/dL — AB (ref 65–99)

## 2017-04-13 MED ORDER — PREDNISONE 20 MG PO TABS: 40.0000 mg | ORAL_TABLET | Freq: Every day | ORAL | 0 refills | Status: DC

## 2017-04-13 MED ORDER — METHYLPREDNISOLONE SODIUM SUCC 125 MG IJ SOLR
125.0000 mg | Freq: Once | INTRAMUSCULAR | Status: AC
  Administered 2017-04-13: 125 mg via INTRAMUSCULAR
  Filled 2017-04-13: qty 2

## 2017-04-13 NOTE — ED Provider Notes (Signed)
[facility]   CSN: [id number] Arrival date & time: 04/13/17  0830     History   Chief Complaint Chief Complaint  Patient presents with  . Numbness  . Rash    HPI Misty Foster is a 46 y.o. female.  Patient is a 46 year old female with a history of seizures, tuberous sclerosis, chronic back pain, asthma presenting today with several complaints.  Patient had surgery of her rotator cuff approximately 6 days ago and since that time has started to develop a rash over the left shoulder which is now spreading up to her left side of her neck, back and upper chest.  Because of her other medication she takes she is unable to take Benadryl.  She has been trying hydrocortisone cream which is not working.  She states it itches constantly and just seems to be spreading.  She also states about 2 days ago she started noticing some numbness and tingling in her right hand.  This is continued and is not changed. Secondly patient states that she is having intermittent numbness of her lower extremities.  It is much worse on the left than right but does occasionally occur on the right as well.  This numbness comes and goes but seems to be the worst when she is laying in her bed.  She denies any pain.  She is able to walk and has not had weakness of her legs.  Patient noticed that the symptoms started just a few hours after surgery.  For surgery she did have a nerve block but had no epidurals or injections into her spine.  She denies any bowel or bladder issues.  She has not had any surgery of her back but has had steroid injections in the past.   The history is provided by the patient.  Rash   This is a new problem. Episode onset: 6 days ago. The problem has been gradually worsening. Associated with: thinks it is related to something from her surgery. There has been no fever. The rash is present on the torso and left arm. The pain is at a severity of  0/10. The patient is experiencing no pain. The pain has been constant since onset. Associated symptoms include itching. She has tried anti-itch cream for the symptoms. The treatment provided no relief.    Past Medical History:  Diagnosis Date  . Arthritis   . Asthma   . Back pain   . Chronic back pain   . Incomplete rotator cuff tear or rupture of left shoulder, not specified as traumatic 04/07/2017  . Seizures (HCC)   . Tuberous sclerosis [facility](HCC)     Patient Active Problem List   Diagnosis Date Noted  . Incomplete rotator cuff tear or rupture of left shoulder, not specified as traumatic 04/07/2017  . Left rotator cuff tear 04/07/2017  . Left shoulder pain 01/01/2017  . Pain in right hip 11/10/2016  . Chronic right-sided low back pain without sciatica 11/10/2016  . 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 Neuropathy 06/20/2016  . Chronic back pain 05/01/2016  . Migraine without aura and without status migrainosus, not intractable 02/11/2015  . 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 ABLATION     cervical  . CESAREAN SECTION     x3  . SHOULDER ARTHROSCOPY WITH ROTATOR CUFF REPAIR Left 04/07/2017  Procedure: SHOULDER ARTHROSCOPY WITH ROTATOR CUFF REPAIR, ACROMIOPLASTY AND EXTENSIVE DEBRIDEMENT;  Surgeon: Teryl Lucy, MD;  Location: [facility] OR;  Service: Orthopedics;  Laterality: Left;    OB History    No data available       Home Medications    Prior to Admission medications   Medication Sig Start Date End Date Taking? Authorizing Provider  albuterol (VENTOLIN HFA) 108 (90 Base) MCG/ACT inhaler Inhale 2 puffs into the lungs every 6 (six) hours as needed for wheezing or shortness of breath.  09/13/15   [provider]  baclofen (LIORESAL) 10 MG tablet Take 1-2 tablets (10-20 mg total) by mouth  3 (three) times daily as needed for muscle spasms. Depends on pain level if takes 1-2 tablets 04/07/17   Teryl Lucy, MD  Calcium Carb-Cholecalciferol (CALCIUM 600 + D PO) Take 1 tablet by mouth daily.    [provider]  lidocaine (LIDODERM) 5 % Place 1 patch onto the skin daily. Remove & Discard patch within 12 hours or as directed by MD Patient taking differently: Place 1-2 patches onto the skin daily. Remove & Discard patch within 12 hours or as directed by MD 07/22/16   Molpus, Jonny Ruiz, MD  ondansetron (ZOFRAN) 4 MG tablet Take 1 tablet (4 mg total) by mouth every 8 (eight) hours as needed for nausea or vomiting. 04/07/17   Teryl Lucy, MD  oxyCODONE (ROXICODONE) 5 MG immediate release tablet Take 1-2 tablets (5-10 mg total) by mouth every 4 (four) hours as needed for severe pain. 04/07/17   Teryl Lucy, MD  PHENobarbital (LUMINAL) 97.2 MG tablet Take 1.5 tablets (145.8 mg total) by mouth every evening. 12/18/16   Van Clines, MD  predniSONE (DELTASONE) 20 MG tablet Take 2 tablets (40 mg total) by mouth daily. 04/13/17   Gwyneth Sprout, MD  sennosides-docusate sodium (SENOKOT-S) 8.6-50 MG tablet Take 2 tablets by mouth daily. 04/07/17   Teryl Lucy, MD    Family History Family History  Problem Relation Age of Onset  . Breast cancer Maternal Grandmother     Social History Social History   Tobacco Use  . Smoking status: Light Tobacco Smoker    Packs/day: 0.25    Types: Cigarettes  . Smokeless tobacco: Never Used  Substance Use Topics  . Alcohol use: No  . Drug use: No     Allergies   Hydrocodone-acetaminophen; Lamictal [lamotrigine]; and Gabapentin   Review of Systems Review of Systems  Skin: Positive for itching and rash.  All other systems reviewed and are negative.    Physical Exam Updated Vital Signs BP 108/64 (BP Location: Right Arm)   Pulse 66   Temp 97.8 F (36.6 C) (Oral)   Resp 18   Ht 5\' 4"  (1.626 m)   Wt (!) 158.8 kg (350 lb)    SpO2 100%   BMI 60.08 kg/m   Physical Exam  Constitutional: She is oriented to person, place, and time. She appears well-developed and well-nourished. No distress.  HENT:  Head: Normocephalic and atraumatic.  Mouth/Throat: Oropharynx is clear and moist.  Eyes: Conjunctivae and EOM are normal. Pupils are equal, round, and reactive to light.  Neck: Normal range of motion. Neck supple.  Cardiovascular: Normal rate, regular rhythm and intact distal pulses.  No murmur heard. Pulmonary/Chest: Effort normal and breath sounds normal. No respiratory distress. She has no wheezes. She has no rales.  Abdominal: Soft. She exhibits no distension. There is no tenderness. There is no rebound and no guarding.  Musculoskeletal: Normal  range of motion. She exhibits no edema or tenderness.  Neurological: She is alert and oriented to person, place, and time.  5/5 strength in bilateral lower ext.  Mild decreased sensation over the left 1st and 2nd digits.  Skin: Skin is warm and dry. Rash noted. No erythema.  Fine, confluent, excoriated papular rash over the left shoulder, neck and lower arm.  No vesicles, drainage or crusting  Psychiatric: She has a normal mood and affect. Her behavior is normal.  Nursing note and vitals reviewed.    ED Treatments / Results  Labs (all labs ordered are listed, but only abnormal results are displayed) Labs Reviewed  COMPREHENSIVE METABOLIC PANEL - Abnormal; Notable for the following components:      Result Value   Albumin 3.1 (*)    ALT 13 (*)    All other components within normal limits  URINALYSIS, ROUTINE W REFLEX MICROSCOPIC - Abnormal; Notable for the following components:   Color, Urine STRAW (*)    All other components within normal limits  CBG MONITORING, ED - Abnormal; Notable for the following components:   Glucose-Capillary 128 (*)    All other components within normal limits  I-STAT CHEM 8, ED - Abnormal; Notable for the following components:   Chloride  100 (*)    Glucose, Bld 104 (*)    All other components within normal limits  PROTIME-INR  APTT  CBC  DIFFERENTIAL  I-STAT TROPONIN, ED  I-STAT BETA HCG BLOOD, ED ([facility], [facility], [facility] ONLY)    EKG  EKG Interpretation None       Radiology Ct Head Wo Contrast  Result Date: 04/13/2017 CLINICAL DATA:  Left-sided numbness in the arm and leg. Recent shoulder surgery. EXAM: CT HEAD WITHOUT CONTRAST TECHNIQUE: Contiguous axial images were obtained from the base of the skull through the vertex without intravenous contrast. COMPARISON:  None. FINDINGS: Brain: No evidence for acute hemorrhage, mass lesion, midline shift, hydrocephalus or large infarct. Vascular: No hyperdense vessel or unexpected calcification. Skull: Normal. Negative for fracture or focal lesion. Sinuses/Orbits: No acute finding. Other: None. IMPRESSION: No acute intracranial abnormality. Electronically Signed   By: Richarda OverlieAdam  Henn M.D.   On: 04/13/2017 10:06    Procedures Procedures (including critical care time)  Medications Ordered in ED Medications  methylPREDNISolone sodium succinate (SOLU-MEDROL) 125 mg/2 mL injection 125 mg (125 mg Intramuscular Given 04/13/17 1314)     Initial Impression / Assessment and Plan / ED Course  I have reviewed the triage vital signs and the nursing notes.  Pertinent labs & imaging results that were available during my care of the patient were reviewed by me and considered in my medical decision making (see chart for details).     Patient presenting with symptoms of a rash of her left upper extremity that is worsening since her surgery 6 days ago.  It appears to be allergic in nature but because patient takes phenobarbital she is not able to take Benadryl.  No infectious symptoms identified.  Will treat patient with prednisone.  Secondly patient is complaining of numbness in her left arm but also in her lower legs left greater than right.  Patient states that 2 days ago she started noticing  numbness in the fingers of her left hand.  She 6 days ago had shoulder surgery.  She had a block during surgery that she tolerated well.  Patient has less than 2-second capillary refill on exam with palpable pulse.  She does have decreased sensation in the fingers but  most likely related to the recent surgery and swelling.  Would assume that this would improve with time.  Secondly the numbness she is experiencing in her legs is most likely related to her chronic back issues.  She states about 2 hours or so after leaving her surgery her symptoms started.  They come and go and are not constant.  She will feel a numbness in her left lower extremity but will also feel it intermittently in the right.  She currently only has some mild numbness in her toes.  She denies any difficulty walking.  She has never had back surgery but has had steroid injections in the past.  Feel this is most likely radicular manifestations.  Low suspicion that patient is having a stroke back.  Some of her numbness and issues may be related to how she was positioned during her surgery.  She is neurovascularly intact currently.  While patient was waiting in the waiting room they did a stroke workup all of which was negative.  Do not feel that patient is having a stroke today.  Encouraged patient to follow-up with her orthopedist next week as planned.  Also encouraged her to follow-up with her spinal doctor if her symptoms do not resolve.  Final Clinical Impressions(s) / ED Diagnoses   Final diagnoses:  Rash  Lumbar radiculopathy    ED Discharge Orders        Ordered    predniSONE (DELTASONE) 20 MG tablet  Daily     04/13/17 1408       Gwyneth Sprout, MD 04/13/17 1430

## 2017-04-13 NOTE — ED Triage Notes (Signed)
Pt reports intermittent numbness in LUE, LLE since rotator cuff repair last Tuesday. Pt also reports rash to LUE, neck, L anterior chest since Thursday. LUE in sling, unable to assess for drift in triage. L grip weaker than R, unable to determine if due to recent surgery. LSN 04/07/17.

## 2017-04-22 DIAGNOSIS — M75112 Incomplete rotator cuff tear or rupture of left shoulder, not specified as traumatic: Secondary | ICD-10-CM | POA: Diagnosis not present

## 2017-05-12 DIAGNOSIS — F4320 Adjustment disorder, unspecified: Secondary | ICD-10-CM | POA: Diagnosis not present

## 2017-05-12 DIAGNOSIS — R69 Illness, unspecified: Secondary | ICD-10-CM | POA: Diagnosis not present

## 2017-05-13 DIAGNOSIS — R69 Illness, unspecified: Secondary | ICD-10-CM | POA: Diagnosis not present

## 2017-05-19 DIAGNOSIS — R69 Illness, unspecified: Secondary | ICD-10-CM | POA: Diagnosis not present

## 2017-06-01 DIAGNOSIS — R531 Weakness: Secondary | ICD-10-CM | POA: Diagnosis not present

## 2017-06-01 DIAGNOSIS — M25612 Stiffness of left shoulder, not elsewhere classified: Secondary | ICD-10-CM | POA: Diagnosis not present

## 2017-06-01 DIAGNOSIS — M75102 Unspecified rotator cuff tear or rupture of left shoulder, not specified as traumatic: Secondary | ICD-10-CM | POA: Diagnosis not present

## 2017-06-01 DIAGNOSIS — M25512 Pain in left shoulder: Secondary | ICD-10-CM | POA: Diagnosis not present

## 2017-06-05 DIAGNOSIS — R531 Weakness: Secondary | ICD-10-CM | POA: Diagnosis not present

## 2017-06-05 DIAGNOSIS — M75102 Unspecified rotator cuff tear or rupture of left shoulder, not specified as traumatic: Secondary | ICD-10-CM | POA: Diagnosis not present

## 2017-06-05 DIAGNOSIS — M25512 Pain in left shoulder: Secondary | ICD-10-CM | POA: Diagnosis not present

## 2017-06-05 DIAGNOSIS — M25612 Stiffness of left shoulder, not elsewhere classified: Secondary | ICD-10-CM | POA: Diagnosis not present

## 2017-06-08 DIAGNOSIS — Z6841 Body Mass Index (BMI) 40.0 and over, adult: Secondary | ICD-10-CM | POA: Diagnosis not present

## 2017-06-08 DIAGNOSIS — Z713 Dietary counseling and surveillance: Secondary | ICD-10-CM | POA: Diagnosis not present

## 2017-06-08 DIAGNOSIS — M549 Dorsalgia, unspecified: Secondary | ICD-10-CM | POA: Diagnosis not present

## 2017-06-10 DIAGNOSIS — M25512 Pain in left shoulder: Secondary | ICD-10-CM | POA: Diagnosis not present

## 2017-06-10 DIAGNOSIS — M25612 Stiffness of left shoulder, not elsewhere classified: Secondary | ICD-10-CM | POA: Diagnosis not present

## 2017-06-10 DIAGNOSIS — M75102 Unspecified rotator cuff tear or rupture of left shoulder, not specified as traumatic: Secondary | ICD-10-CM | POA: Diagnosis not present

## 2017-06-11 ENCOUNTER — Other Ambulatory Visit: Payer: Self-pay | Admitting: Neurology

## 2017-06-11 DIAGNOSIS — G40109 Localization-related (focal) (partial) symptomatic epilepsy and epileptic syndromes with simple partial seizures, not intractable, without status epilepticus: Secondary | ICD-10-CM

## 2017-06-11 DIAGNOSIS — R69 Illness, unspecified: Secondary | ICD-10-CM | POA: Diagnosis not present

## 2017-06-16 DIAGNOSIS — R69 Illness, unspecified: Secondary | ICD-10-CM | POA: Diagnosis not present

## 2017-06-17 ENCOUNTER — Other Ambulatory Visit: Payer: Self-pay | Admitting: Orthopedic Surgery

## 2017-06-17 DIAGNOSIS — M25512 Pain in left shoulder: Secondary | ICD-10-CM

## 2017-06-29 ENCOUNTER — Other Ambulatory Visit: Payer: Self-pay

## 2017-06-29 ENCOUNTER — Emergency Department (HOSPITAL_BASED_OUTPATIENT_CLINIC_OR_DEPARTMENT_OTHER)
Admission: EM | Admit: 2017-06-29 | Discharge: 2017-06-29 | Disposition: A | Discharge status: Home / Self Care | Payer: Managed Care, Other (non HMO) | Attending: Emergency Medicine | Admitting: Emergency Medicine

## 2017-06-29 ENCOUNTER — Encounter (HOSPITAL_BASED_OUTPATIENT_CLINIC_OR_DEPARTMENT_OTHER): Payer: Self-pay | Admitting: Emergency Medicine

## 2017-06-29 DIAGNOSIS — J45909 Unspecified asthma, uncomplicated: Secondary | ICD-10-CM | POA: Insufficient documentation

## 2017-06-29 DIAGNOSIS — F17210 Nicotine dependence, cigarettes, uncomplicated: Secondary | ICD-10-CM | POA: Diagnosis not present

## 2017-06-29 DIAGNOSIS — R69 Illness, unspecified: Secondary | ICD-10-CM | POA: Diagnosis not present

## 2017-06-29 DIAGNOSIS — M5441 Lumbago with sciatica, right side: Secondary | ICD-10-CM | POA: Diagnosis not present

## 2017-06-29 DIAGNOSIS — R1031 Right lower quadrant pain: Secondary | ICD-10-CM | POA: Diagnosis present

## 2017-06-29 DIAGNOSIS — G8929 Other chronic pain: Secondary | ICD-10-CM | POA: Insufficient documentation

## 2017-06-29 DIAGNOSIS — Z79899 Other long term (current) drug therapy: Secondary | ICD-10-CM | POA: Insufficient documentation

## 2017-06-29 MED ORDER — DEXAMETHASONE SODIUM PHOSPHATE 10 MG/ML IJ SOLN
10.0000 mg | Freq: Once | INTRAMUSCULAR | Status: AC
  Administered 2017-06-29: 10 mg via INTRAMUSCULAR
  Filled 2017-06-29: qty 1

## 2017-06-29 NOTE — ED Provider Notes (Signed)
[facility] EMERGENCY DEPARTMENT Provider Note   CSN: [id number] Arrival date & time: 06/29/17  1132     History   Chief Complaint Chief Complaint  Patient presents with  . Back Pain    HPI Kathlyne Ladoris GeneJenkins Wemhoff is a 47 y.o. female with history of chronic back pain and is here for evaluation of pain to the right lower back that radiates to the midline for 2 days described as sharp. States this pain is typical of her back pain. Aggravated by standing up straight and palpation. Slightly alleviated with oxycodone, baclofen and leaning forward. Has already called her orthopedist who has called in a prescription for methylprednisone. Also already has oxycodone and Tylenol/codeine at home, states that she usually doesn't like to take these medicines during the day because they make her sleepy and only uses them in the evening and before bedtime. She has an appointment with her PCP tomorrow and her orthopedist on the 11th. Came to ED because usually gets a shot of a steroid here in the emergency department that helps the pain. She denies falls or trauma, fevers, chills, abdominal pain, flank pain, urinary symptoms, saddle anesthesia, bladder or bowel incontinence or retention, numbness or weakness to her extremities. She had an MRI of her back 6 months ago and since her orthopedist has been able to do steroid injections in her back to provide pain relief for couple of weeks however she needed left shoulder surgery and has been unable to really come up with a treatment plan for her back. Has appointment with orthopedist next week for further discussion of her chronic back pain.  HPI  Past Medical History:  Diagnosis Date  . Arthritis   . Asthma   . Back pain   . Chronic back pain   . Incomplete rotator cuff tear or rupture of left shoulder, not specified as traumatic 04/07/2017  . Seizures (HCC)   . Tuberous sclerosis [facility](HCC)     Patient Active Problem List   Diagnosis Date Noted  .  Incomplete rotator cuff tear or rupture of left shoulder, not specified as traumatic 04/07/2017  . Left rotator cuff tear 04/07/2017  . Left shoulder pain 01/01/2017  . Pain in right hip 11/10/2016  . Chronic right-sided low back pain without sciatica 11/10/2016  . 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 Neuropathy 06/20/2016  . Chronic back pain 05/01/2016  . Migraine without aura and without status migrainosus, not intractable 02/11/2015  . 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 ABLATION     cervical  . CESAREAN SECTION     x3  . SHOULDER ARTHROSCOPY WITH ROTATOR CUFF REPAIR Left 04/07/2017   Procedure: SHOULDER ARTHROSCOPY WITH ROTATOR CUFF REPAIR, ACROMIOPLASTY AND EXTENSIVE DEBRIDEMENT;  Surgeon: Teryl LucyLandau, Joshua, MD;  Location: [facility] OR;  Service: Orthopedics;  Laterality: Left;    OB History    No data available       Home Medications    Prior to Admission medications   Medication Sig Start Date End Date Taking? Authorizing Provider  albuterol (VENTOLIN HFA) 108 (90 Base) MCG/ACT inhaler Inhale 2 puffs into the lungs every 6 (six) hours as needed for wheezing or shortness of breath.  09/13/15   [provider]  baclofen (LIORESAL) 10 MG tablet Take 1-2 tablets (10-20 mg total) by mouth 3 (three) times daily as needed for muscle spasms.  Depends on pain level if takes 1-2 tablets 04/07/17   Teryl Lucy, MD  Calcium Carb-Cholecalciferol (CALCIUM 600 + D PO) Take 1 tablet by mouth daily.    [provider]  lidocaine (LIDODERM) 5 % Place 1 patch onto the skin daily. Remove & Discard patch within 12 hours or as directed by MD Patient taking differently: Place 1-2 patches onto the skin daily. Remove & Discard patch within 12 hours or as directed by MD 07/22/16   Molpus, Jonny Ruiz, MD    ondansetron (ZOFRAN) 4 MG tablet Take 1 tablet (4 mg total) by mouth every 8 (eight) hours as needed for nausea or vomiting. 04/07/17   Teryl Lucy, MD  oxyCODONE (ROXICODONE) 5 MG immediate release tablet Take 1-2 tablets (5-10 mg total) by mouth every 4 (four) hours as needed for severe pain. 04/07/17   Teryl Lucy, MD  PHENobarbital (LUMINAL) 97.2 MG tablet TAKE 1& 1/2 TABLETS BY MOUTH EVERY EVENING 06/11/17   Van Clines, MD  predniSONE (DELTASONE) 20 MG tablet Take 2 tablets (40 mg total) by mouth daily. 04/13/17   Gwyneth Sprout, MD  sennosides-docusate sodium (SENOKOT-S) 8.6-50 MG tablet Take 2 tablets by mouth daily. 04/07/17   Teryl Lucy, MD    Family History Family History  Problem Relation Age of Onset  . Breast cancer Maternal Grandmother     Social History Social History   Tobacco Use  . Smoking status: Light Tobacco Smoker    Packs/day: 0.25    Types: Cigarettes  . Smokeless tobacco: Never Used  Substance Use Topics  . Alcohol use: No  . Drug use: No     Allergies   Hydrocodone-acetaminophen; Lamictal [lamotrigine]; and Gabapentin   Review of Systems Review of Systems  Musculoskeletal: Positive for back pain.  All other systems reviewed and are negative.    Physical Exam Updated Vital Signs BP (!) 142/59 (BP Location: Right Arm)   Pulse 98   Temp 97.8 F (36.6 C) (Oral)   Resp 20   Ht 5\' 4"  (1.626 m)   Wt (!) 158.8 kg (350 lb)   SpO2 99%   BMI 60.08 kg/m   Physical Exam  Constitutional: She is oriented to person, place, and time. She appears well-developed and well-nourished. No distress.  HENT:  Head: Normocephalic and atraumatic.  Nose: Nose normal.  Eyes: EOM are normal.  Cardiovascular: Normal rate, S1 normal, S2 normal and normal heart sounds.  Pulses:      Radial pulses are 2+ on the right side, and 2+ on the left side.       Dorsalis pedis pulses are 2+ on the right side, and 2+ on the left side.  Pulmonary/Chest:  Effort normal and breath sounds normal. She has no decreased breath sounds.  Abdominal: Soft. Normal appearance and bowel sounds are normal. There is no tenderness.  No suprapubic or CVA tenderness   Musculoskeletal: She exhibits tenderness.       Lumbar back: She exhibits tenderness and pain.  TTP to entire midline L-spine w/o step offs. Right lumbar muscle tenderness. TTP to right SI joint. Positive SLR on right. Positive faber on right. No CT spine tenderness. Full AROM of hips bilaterally, pain with full flexion of right hip  Neurological: She is alert and oriented to person, place, and time.  5/5 strength with flexion/extension of hip, knee and ankle, bilaterally.  Sensation to light touch intact in lower extremities including feet  Skin: Skin is warm and dry. Capillary refill takes less than  2 seconds.  Psychiatric: She has a normal mood and affect. Her speech is normal and behavior is normal. Judgment and thought content normal. Cognition and memory are normal.  Nursing note and vitals reviewed.    ED Treatments / Results  Labs (all labs ordered are listed, but only abnormal results are displayed) Labs Reviewed - No data to display  EKG  EKG Interpretation None       Radiology No results found.  Procedures Procedures (including critical care time)  Medications Ordered in ED Medications  dexamethasone (DECADRON) injection 10 mg (not administered)     Initial Impression / Assessment and Plan / ED Course  I have reviewed the triage vital signs and the nursing notes.  Pertinent labs & imaging results that were available during my care of the patient were reviewed by me and considered in my medical decision making (see chart for details).    47 year old female with history of chronic back pain and is here for acute on chronic right-sided back pain for 2 days. Exam, as above, is reassuring has findings of muscular/radiculopathy without numbness or weakness to her  extremities, rashes, abdominal pain. She has no fevers or chills. No recent falls. No urinary symptoms. No h/o kidney stones no CVAT. Has already contacted her orthopedist who has called in a prescription for methylprednisone. We'll give a dose of Decadron IM and discharged. She has appointment with PCP tomorrow and orthopedist next week. I don't think further emergent lab work or imaging is indicated today. Discussed return precautions.  Final Clinical Impressions(s) / ED Diagnoses   Final diagnoses:  Chronic right-sided low back pain with right-sided sciatica    ED Discharge Orders    None       Jerrell Mylar 06/29/17 1238    Maia Plan, MD 06/29/17 2004

## 2017-06-29 NOTE — Discharge Instructions (Signed)
Take home oxycodone and tylenol #3s as prescribed by your doctor. You can take up to 1000 mg tylenol every 6 hours. Do not exceed 4,000 mg tylenol in 24 hours. Follow up with surgeon as scheduled.

## 2017-06-29 NOTE — ED Triage Notes (Signed)
Patient reports chronic back pain.  Reports this incident began yesterday.  Reports pain to lower back.

## 2017-06-30 DIAGNOSIS — H539 Unspecified visual disturbance: Secondary | ICD-10-CM | POA: Diagnosis not present

## 2017-06-30 DIAGNOSIS — R251 Tremor, unspecified: Secondary | ICD-10-CM | POA: Diagnosis not present

## 2017-06-30 DIAGNOSIS — R51 Headache: Secondary | ICD-10-CM | POA: Diagnosis not present

## 2017-07-01 ENCOUNTER — Ambulatory Visit
Admission: RE | Admit: 2017-07-01 | Discharge: 2017-07-01 | Disposition: A | Discharge status: Home / Self Care | Payer: Managed Care, Other (non HMO) | Source: Ambulatory Visit | Attending: Orthopedic Surgery | Admitting: Orthopedic Surgery

## 2017-07-01 DIAGNOSIS — M25512 Pain in left shoulder: Secondary | ICD-10-CM

## 2017-07-01 DIAGNOSIS — R69 Illness, unspecified: Secondary | ICD-10-CM | POA: Diagnosis not present

## 2017-07-01 DIAGNOSIS — M19012 Primary osteoarthritis, left shoulder: Secondary | ICD-10-CM | POA: Diagnosis not present

## 2017-07-01 MED ORDER — IOPAMIDOL (ISOVUE-M 200) INJECTION 41%
12.0000 mL | Freq: Once | INTRAMUSCULAR | Status: AC
  Administered 2017-07-01: 12 mL via INTRA_ARTICULAR

## 2017-07-02 ENCOUNTER — Encounter (HOSPITAL_BASED_OUTPATIENT_CLINIC_OR_DEPARTMENT_OTHER): Payer: Self-pay | Admitting: *Deleted

## 2017-07-02 ENCOUNTER — Emergency Department (HOSPITAL_BASED_OUTPATIENT_CLINIC_OR_DEPARTMENT_OTHER)
Admission: EM | Admit: 2017-07-02 | Discharge: 2017-07-02 | Disposition: A | Discharge status: Home / Self Care | Payer: Managed Care, Other (non HMO) | Source: Home / Self Care | Attending: Emergency Medicine | Admitting: Emergency Medicine

## 2017-07-02 ENCOUNTER — Encounter (HOSPITAL_COMMUNITY): Payer: Self-pay

## 2017-07-02 ENCOUNTER — Other Ambulatory Visit: Payer: Self-pay

## 2017-07-02 ENCOUNTER — Emergency Department (HOSPITAL_COMMUNITY)
Admission: EM | Admit: 2017-07-02 | Discharge: 2017-07-02 | Disposition: A | Discharge status: Home / Self Care | Payer: Managed Care, Other (non HMO) | Attending: Emergency Medicine | Admitting: Emergency Medicine

## 2017-07-02 DIAGNOSIS — F17210 Nicotine dependence, cigarettes, uncomplicated: Secondary | ICD-10-CM

## 2017-07-02 DIAGNOSIS — R21 Rash and other nonspecific skin eruption: Secondary | ICD-10-CM | POA: Diagnosis not present

## 2017-07-02 DIAGNOSIS — L304 Erythema intertrigo: Secondary | ICD-10-CM | POA: Insufficient documentation

## 2017-07-02 DIAGNOSIS — J45909 Unspecified asthma, uncomplicated: Secondary | ICD-10-CM | POA: Insufficient documentation

## 2017-07-02 DIAGNOSIS — Z5321 Procedure and treatment not carried out due to patient leaving prior to being seen by health care provider: Secondary | ICD-10-CM | POA: Insufficient documentation

## 2017-07-02 MED ORDER — CLOTRIMAZOLE 1 % EX CREA: TOPICAL_CREAM | CUTANEOUS | 0 refills | Status: DC

## 2017-07-02 MED ORDER — FLUCONAZOLE 100 MG PO TABS
200.0000 mg | ORAL_TABLET | Freq: Once | ORAL | Status: AC
  Administered 2017-07-02: 200 mg via ORAL
  Filled 2017-07-02: qty 2

## 2017-07-02 NOTE — ED Triage Notes (Signed)
Pt states she has a rash to her right hip/groin area. Pt states she has hx of herpes. States she is concerned she could have shingles. Pt also reports associated nausea/headache. No open blisters noted.

## 2017-07-02 NOTE — ED Notes (Signed)
  States she was leaving couldn't wait all day.

## 2017-07-02 NOTE — ED Triage Notes (Signed)
Rash on her right groin.

## 2017-07-08 DIAGNOSIS — R69 Illness, unspecified: Secondary | ICD-10-CM | POA: Diagnosis not present

## 2017-07-09 NOTE — ED Provider Notes (Signed)
[facility] EMERGENCY DEPARTMENT Provider Note   CSN: [id number] Arrival date & time: 07/02/17  1043     History   Chief Complaint Chief Complaint  Patient presents with  . Rash    HPI Misty Foster is a 47 y.o. female.  HPI   47 year old female with rash.  Underneath the fold of her pannus on the right side.  It has been ongoing for a couple weeks.  It itches and burns.  Red.  No other acute complaints.  Past Medical History:  Diagnosis Date  . Arthritis   . Asthma   . Back pain   . Chronic back pain   . Incomplete rotator cuff tear or rupture of left shoulder, not specified as traumatic 04/07/2017  . Seizures (HCC)   . 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 Incomplete rotator cuff tear or rupture of left shoulder, not specified as traumatic 04/07/2017  . Left rotator cuff tear 04/07/2017  . Left shoulder pain 01/01/2017  . Pain in right hip 11/10/2016  . Chronic right-sided low back pain without sciatica 11/10/2016  . 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 Neuropathy 06/20/2016  . Chronic back pain 05/01/2016  . Migraine without aura and without status migrainosus, not intractable 02/11/2015  . 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 ABLATION     cervical  . CESAREAN SECTION     x3  . SHOULDER ARTHROSCOPY WITH ROTATOR CUFF REPAIR Left 04/07/2017   Procedure: SHOULDER ARTHROSCOPY WITH ROTATOR CUFF REPAIR, ACROMIOPLASTY AND EXTENSIVE DEBRIDEMENT;  Surgeon: Teryl LucyLandau, Joshua, MD;  Location: [facility] OR;  Service: Orthopedics;  Laterality: Left;    OB History    No data available       Home Medications    Prior to Admission medications   Medication Sig Start Date End Date Taking? Authorizing Provider  albuterol (VENTOLIN  HFA) 108 (90 Base) MCG/ACT inhaler Inhale 2 puffs into the lungs every 6 (six) hours as needed for wheezing or shortness of breath.  09/13/15   [provider]  baclofen (LIORESAL) 10 MG tablet Take 1-2 tablets (10-20 mg total) by mouth 3 (three) times daily as needed for muscle spasms. Depends on pain level if takes 1-2 tablets 04/07/17   Teryl LucyLandau, Joshua, MD  Calcium Carb-Cholecalciferol (CALCIUM 600 + D PO) Take 1 tablet by mouth daily.    [provider]  clotrimazole (LOTRIMIN) 1 % cream Apply to affected area 2 times daily 07/02/17   Raeford RazorKohut, Vernee Baines, MD  lidocaine (LIDODERM) 5 % Place 1 patch onto the skin daily. Remove & Discard patch within 12 hours or as directed by MD Patient taking differently: Place 1-2 patches onto the skin daily. Remove & Discard patch within 12 hours or as directed by MD 07/22/16   Molpus, Jonny RuizJohn, MD  ondansetron (ZOFRAN) 4 MG tablet Take 1 tablet (4 mg total) by mouth every 8 (eight) hours as needed for nausea or vomiting. 04/07/17   Teryl LucyLandau, Joshua, MD  oxyCODONE (ROXICODONE) 5 MG immediate release tablet Take 1-2 tablets (5-10 mg total) by mouth every 4 (four) hours as needed for severe pain. 04/07/17   Teryl LucyLandau, Joshua, MD  PHENobarbital (LUMINAL) 97.2 MG tablet TAKE 1& 1/2 TABLETS BY MOUTH EVERY EVENING 06/11/17   Van ClinesAquino, Karen M, MD  predniSONE (DELTASONE) 20 MG tablet  Take 2 tablets (40 mg total) by mouth daily. 04/13/17   Gwyneth Sprout, MD  sennosides-docusate sodium (SENOKOT-S) 8.6-50 MG tablet Take 2 tablets by mouth daily. 04/07/17   Teryl Lucy, MD    Family History Family History  Problem Relation Age of Onset  . Breast cancer Maternal Grandmother     Social History Social History   Tobacco Use  . Smoking status: Light Tobacco Smoker    Packs/day: 0.25    Types: Cigarettes  . Smokeless tobacco: Never Used  Substance Use Topics  . Alcohol use: No  . Drug use: No     Allergies   Hydrocodone-acetaminophen; Lamictal [lamotrigine];  and Gabapentin   Review of Systems Review of Systems   All systems reviewed and negative, other than as noted in HPI.   Physical Exam Updated Vital Signs BP 121/80   Pulse 80   Temp 98 F (36.7 C) (Oral)   Resp 20   Ht 5\' 4"  (1.626 m)   Wt (!) 158.8 kg (350 lb)   SpO2 100%   BMI 60.08 kg/m   Physical Exam  Constitutional: She appears well-developed and well-nourished. No distress.  HENT:  Head: Normocephalic and atraumatic.  Eyes: Conjunctivae are normal. Right eye exhibits no discharge. Left eye exhibits no discharge.  Neck: Neck supple.  Cardiovascular: Normal rate, regular rhythm and normal heart sounds. Exam reveals no gallop and no friction rub.  No murmur heard. Pulmonary/Chest: Effort normal and breath sounds normal. No respiratory distress.  Abdominal: Soft. She exhibits no distension. There is no tenderness.  Musculoskeletal: She exhibits no edema or tenderness.  Neurological: She is alert.  Skin: Skin is warm and dry.  Skin changes consistent with intertrigo underneath pannus.  Psychiatric: She has a normal mood and affect. Her behavior is normal. Thought content normal.  Nursing note and vitals reviewed.    ED Treatments / Results  Labs (all labs ordered are listed, but only abnormal results are displayed) Labs Reviewed - No data to display  EKG  EKG Interpretation None       Radiology No results found.  Procedures Procedures (including critical care time)  Medications Ordered in ED Medications  fluconazole (DIFLUCAN) tablet 200 mg (200 mg Oral Given 07/02/17 1136)     Initial Impression / Assessment and Plan / ED Course  I have reviewed the triage vital signs and the nursing notes.  Pertinent labs & imaging results that were available during my care of the patient were reviewed by me and considered in my medical decision making (see chart for details).     47 year old female with rash.  Consistent with intertrigo.  Care was discussed  including trying to keep clean and dry.  Loose clothing.  Clotrimazole for possible fungal superinfection.  Final Clinical Impressions(s) / ED Diagnoses   Final diagnoses:  Intertrigo    ED Discharge Orders        Ordered    clotrimazole (LOTRIMIN) 1 % cream     07/02/17 1126       Raeford Razor, MD 07/09/17 [phone]

## 2017-07-13 ENCOUNTER — Other Ambulatory Visit: Payer: Self-pay

## 2017-07-13 DIAGNOSIS — G40109 Localization-related (focal) (partial) symptomatic epilepsy and epileptic syndromes with simple partial seizures, not intractable, without status epilepticus: Secondary | ICD-10-CM

## 2017-07-13 MED ORDER — PHENOBARBITAL 97.2 MG PO TABS: ORAL_TABLET | ORAL | 0 refills | Status: DC

## 2017-07-16 DIAGNOSIS — R69 Illness, unspecified: Secondary | ICD-10-CM | POA: Diagnosis not present

## 2017-07-22 DIAGNOSIS — R69 Illness, unspecified: Secondary | ICD-10-CM | POA: Diagnosis not present

## 2017-08-03 DIAGNOSIS — M25511 Pain in right shoulder: Secondary | ICD-10-CM | POA: Diagnosis not present

## 2017-08-05 DIAGNOSIS — M7662 Achilles tendinitis, left leg: Secondary | ICD-10-CM | POA: Diagnosis not present

## 2017-08-05 DIAGNOSIS — M722 Plantar fascial fibromatosis: Secondary | ICD-10-CM | POA: Diagnosis not present

## 2017-08-05 DIAGNOSIS — M79672 Pain in left foot: Secondary | ICD-10-CM | POA: Diagnosis not present

## 2017-08-20 DIAGNOSIS — M79672 Pain in left foot: Secondary | ICD-10-CM | POA: Diagnosis not present

## 2017-08-20 DIAGNOSIS — M722 Plantar fascial fibromatosis: Secondary | ICD-10-CM | POA: Diagnosis not present

## 2017-08-26 ENCOUNTER — Other Ambulatory Visit: Payer: Self-pay

## 2017-08-26 ENCOUNTER — Encounter (HOSPITAL_BASED_OUTPATIENT_CLINIC_OR_DEPARTMENT_OTHER): Payer: Self-pay

## 2017-08-26 ENCOUNTER — Emergency Department (HOSPITAL_BASED_OUTPATIENT_CLINIC_OR_DEPARTMENT_OTHER): Payer: Managed Care, Other (non HMO)

## 2017-08-26 ENCOUNTER — Emergency Department (HOSPITAL_BASED_OUTPATIENT_CLINIC_OR_DEPARTMENT_OTHER)
Admission: EM | Admit: 2017-08-26 | Discharge: 2017-08-26 | Disposition: A | Discharge status: Home / Self Care | Payer: Managed Care, Other (non HMO) | Attending: Emergency Medicine | Admitting: Emergency Medicine

## 2017-08-26 DIAGNOSIS — F17210 Nicotine dependence, cigarettes, uncomplicated: Secondary | ICD-10-CM | POA: Diagnosis not present

## 2017-08-26 DIAGNOSIS — J45909 Unspecified asthma, uncomplicated: Secondary | ICD-10-CM | POA: Insufficient documentation

## 2017-08-26 DIAGNOSIS — M25561 Pain in right knee: Secondary | ICD-10-CM | POA: Diagnosis not present

## 2017-08-26 DIAGNOSIS — Z79899 Other long term (current) drug therapy: Secondary | ICD-10-CM | POA: Diagnosis not present

## 2017-08-26 DIAGNOSIS — R69 Illness, unspecified: Secondary | ICD-10-CM | POA: Diagnosis not present

## 2017-08-26 MED ORDER — DICLOFENAC SODIUM ER 100 MG PO TB24: 100.0000 mg | ORAL_TABLET | Freq: Every day | ORAL | 0 refills | Status: DC

## 2017-08-26 MED ORDER — KETOROLAC TROMETHAMINE 30 MG/ML IJ SOLN: 30.0000 mg | Freq: Once | INTRAMUSCULAR | Status: DC

## 2017-08-26 MED ORDER — KETOROLAC TROMETHAMINE 30 MG/ML IJ SOLN
30.0000 mg | Freq: Once | INTRAMUSCULAR | Status: DC
Start: 2017-08-26 — End: 2017-08-26

## 2017-08-26 MED ORDER — DEXAMETHASONE SODIUM PHOSPHATE 10 MG/ML IJ SOLN
10.0000 mg | Freq: Once | INTRAMUSCULAR | Status: AC
  Administered 2017-08-26: 10 mg via INTRAMUSCULAR
  Filled 2017-08-26: qty 1

## 2017-08-26 MED ORDER — METHOCARBAMOL 500 MG PO TABS: 500.0000 mg | ORAL_TABLET | Freq: Two times a day (BID) | ORAL | 0 refills | Status: DC

## 2017-08-26 NOTE — ED Provider Notes (Signed)
[facility] EMERGENCY DEPARTMENT Provider Note   CSN: [id number] Arrival date & time: 08/26/17  0536     History   Chief Complaint Chief Complaint  Patient presents with  . Knee Pain    HPI Misty Foster is a 47 y.o. female.  The history is provided by the patient.  Knee Pain   This is a recurrent problem. The current episode started more than 2 days ago. The problem occurs constantly. The problem has not changed since onset.The pain is present in the right knee. Quality: burning,  The pain is severe. Associated symptoms include stiffness. Pertinent negatives include no numbness. Treatments tried: tylenol with codeine. The treatment provided no relief. There has been no history of extremity trauma. Family history is significant for no gout.  Patient states she has had ongoing issues with her right knee but these have recently gotten worse when she put on a lot of weight secondary to a medical issue.  No new trauma.  No leg swelling or numbness.  No change in color.  Is surrently on T3 for other pain related issues.    Past Medical History:  Diagnosis Date  . Arthritis   . Asthma   . Back pain   . Chronic back pain   . Incomplete rotator cuff tear or rupture of left shoulder, not specified as traumatic 04/07/2017  . Seizures (HCC)   . Tuberous sclerosis [facility])     Patient Active Problem List   Diagnosis Date Noted  . Incomplete rotator cuff tear or rupture of left shoulder, not specified as traumatic 04/07/2017  . Left rotator cuff tear 04/07/2017  . Left shoulder pain 01/01/2017  . Pain in right hip 11/10/2016  . Chronic right-sided low back pain without sciatica 11/10/2016  . 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 Neuropathy 06/20/2016  . Chronic back pain 05/01/2016  . Migraine without aura and without status migrainosus, not  intractable 02/11/2015  . 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 ABLATION     cervical  . CESAREAN SECTION     x3  . SHOULDER ARTHROSCOPY WITH ROTATOR CUFF REPAIR Left 04/07/2017   Procedure: SHOULDER ARTHROSCOPY WITH ROTATOR CUFF REPAIR, ACROMIOPLASTY AND EXTENSIVE DEBRIDEMENT;  Surgeon: Teryl Lucy, MD;  Location: [facility] OR;  Service: Orthopedics;  Laterality: Left;     OB History   None      Home Medications    Prior to Admission medications   Medication Sig Start Date End Date Taking? Authorizing Provider  predniSONE (DELTASONE) 20 MG tablet Take 2 tablets (40 mg total) by mouth daily. 04/13/17  Yes Plunkett, Alphonzo Lemmings, MD  sennosides-docusate sodium (SENOKOT-S) 8.6-50 MG tablet Take 2 tablets by mouth daily. 04/07/17  Yes Teryl Lucy, MD  albuterol (VENTOLIN HFA) 108 (90 Base) MCG/ACT inhaler Inhale 2 puffs into the lungs every 6 (six) hours as needed for wheezing or shortness of breath.  09/13/15   [provider]  baclofen (LIORESAL) 10 MG tablet Take 1-2 tablets (10-20 mg total) by mouth 3 (three) times daily as needed for muscle spasms. Depends on pain level if takes 1-2 tablets 04/07/17   Teryl Lucy, MD  Calcium Carb-Cholecalciferol (CALCIUM 600 + D PO) Take 1 tablet by mouth daily.    [provider]  clotrimazole (LOTRIMIN) 1 % cream Apply to affected area 2 times daily 07/02/17   Juleen China,  Jeannett Senior, MD  lidocaine (LIDODERM) 5 % Place 1 patch onto the skin daily. Remove & Discard patch within 12 hours or as directed by MD Patient taking differently: Place 1-2 patches onto the skin daily. Remove & Discard patch within 12 hours or as directed by MD 07/22/16   Molpus, Jonny Ruiz, MD  ondansetron (ZOFRAN) 4 MG tablet Take 1 tablet (4 mg total) by mouth every 8 (eight) hours as needed for nausea or vomiting. 04/07/17   Teryl Lucy, MD  oxyCODONE (ROXICODONE) 5 MG immediate release tablet Take 1-2 tablets  (5-10 mg total) by mouth every 4 (four) hours as needed for severe pain. 04/07/17   Teryl Lucy, MD  PHENobarbital (LUMINAL) 97.2 MG tablet Take 1 and 1 half tablets by mouth each evening 07/13/17   Van Clines, MD    Family History Family History  Problem Relation Age of Onset  . Breast cancer Maternal Grandmother     Social History Social History   Tobacco Use  . Smoking status: Light Tobacco Smoker    Packs/day: 0.25    Types: Cigarettes  . Smokeless tobacco: Never Used  Substance Use Topics  . Alcohol use: No  . Drug use: No     Allergies   Lamictal [lamotrigine] and Gabapentin   Review of Systems Review of Systems  Respiratory: Negative for shortness of breath.   Cardiovascular: Negative for chest pain, palpitations and leg swelling.  Musculoskeletal: Positive for arthralgias and stiffness. Negative for gait problem and joint swelling.  Neurological: Negative for numbness.  All other systems reviewed and are negative.    Physical Exam Updated Vital Signs BP 134/81 (BP Location: Right Arm)   Pulse 82   Temp 97.6 F (36.4 C) (Oral)   Resp 18   Ht  (1.626 m)   Wt (!) 163.3 kg (360 lb)   LMP 08/23/2017   SpO2 100%   BMI 61.79 kg/m   Physical Exam  Constitutional: She is oriented to person, place, and time. She appears well-developed and well-nourished. No distress.  HENT:  Head: Normocephalic and atraumatic.  Mouth/Throat: No oropharyngeal exudate.  Eyes: Pupils are equal, round, and reactive to light. Conjunctivae are normal.  Neck: Normal range of motion. Neck supple.  Cardiovascular: Normal rate, regular rhythm, normal heart sounds and intact distal pulses.  Pulmonary/Chest: Effort normal and breath sounds normal. No stridor. She has no wheezes. She has no rales.  Abdominal: Soft. Bowel sounds are normal. She exhibits no mass. There is no tenderness. There is no rebound and no guarding.  Musculoskeletal: Normal range of motion.       Right  knee: Normal. She exhibits normal range of motion, no swelling, no effusion, no ecchymosis, no deformity, no laceration, no erythema, normal alignment, no LCL laxity, normal patellar mobility, no bony tenderness, normal meniscus and no MCL laxity. No tenderness found. No medial joint line, no lateral joint line, no MCL, no LCL and no patellar tendon tenderness noted.       Right ankle: Normal.       Right upper leg: Normal.       Right lower leg: Normal.  Neurological: She is alert and oriented to person, place, and time. She displays normal reflexes.  Skin: Skin is warm and dry. Capillary refill takes less than 2 seconds.  Psychiatric: She has a normal mood and affect.     ED Treatments / Results  Labs (all labs ordered are listed, but only abnormal results are displayed) Labs Reviewed -  No data to display  EKG None  Radiology Dg Knee Complete 4 Views Right  Result Date: 08/26/2017 CLINICAL DATA:  Right knee pain for 3 weeks.  Stiffness for 3 days. EXAM: RIGHT KNEE - COMPLETE 4+ VIEW COMPARISON:  11/21/2015 FINDINGS: Degenerative changes in the right knee with narrowed medial compartment. Slight hypertrophic change on the patella. No evidence of acute fracture or dislocation. No focal bone lesion or bone destruction. Bone cortex appears intact. No significant effusion. No change since previous study. IMPRESSION: Mild medial compartment degenerative changes in the right knee. No acute bony abnormalities. Electronically Signed   By: Burman Nieves M.D.   On: 08/26/2017 06:34    Procedures Procedures (including critical care time)  Medications Ordered in ED Medications  ketorolac (TORADOL) 30 MG/ML injection 30 mg (has no administration in time range)  dexamethasone (DECADRON) injection 10 mg (has no administration in time range)      Final Clinical Impressions(s) / ED Diagnoses   Patient refused toradol stating it does not work for her.  She has requested decadron and this was  ordered.  Patient is already using t# 3 and lidoderm patches.  Follow up with sports medicine for ongoing care.  Will add robaxin and voltaren.     Return for weakness, numbness, changes in vision or speech, fevers >100.4 unrelieved by medication, shortness of breath, intractable vomiting, or diarrhea, abdominal pain, Inability to tolerate liquids or food, cough, altered mental status or any concerns. No signs of systemic illness or infection. The patient is nontoxic-appearing on exam and vital signs are within normal limits.   I have reviewed the triage vital signs and the nursing notes. Pertinent labs &imaging results that were available during my care of the patient were reviewed by me and considered in my medical decision making (see chart for details).  After history, exam, and medical workup I feel the patient has been appropriately medically screened and is safe for discharge home. Pertinent diagnoses were discussed with the patient. Patient was given return precautions.    Latham Kinzler, MD 08/26/17 [phone]

## 2017-08-26 NOTE — ED Triage Notes (Addendum)
Pt presents with right knee pain x 3 weeks, hx of same. Pain and "stiffness" increased x 3 days.

## 2017-08-26 NOTE — ED Notes (Signed)
ED Provider at bedside. 

## 2017-08-26 NOTE — ED Notes (Signed)
Patient transported to X-ray 

## 2017-08-29 ENCOUNTER — Encounter (HOSPITAL_BASED_OUTPATIENT_CLINIC_OR_DEPARTMENT_OTHER): Payer: Self-pay | Admitting: Emergency Medicine

## 2017-08-29 ENCOUNTER — Emergency Department (HOSPITAL_BASED_OUTPATIENT_CLINIC_OR_DEPARTMENT_OTHER)
Admission: EM | Admit: 2017-08-29 | Discharge: 2017-08-29 | Disposition: A | Discharge status: Home / Self Care | Payer: Managed Care, Other (non HMO) | Attending: Emergency Medicine | Admitting: Emergency Medicine

## 2017-08-29 ENCOUNTER — Emergency Department (HOSPITAL_BASED_OUTPATIENT_CLINIC_OR_DEPARTMENT_OTHER): Payer: Managed Care, Other (non HMO)

## 2017-08-29 ENCOUNTER — Other Ambulatory Visit: Payer: Self-pay

## 2017-08-29 DIAGNOSIS — F17210 Nicotine dependence, cigarettes, uncomplicated: Secondary | ICD-10-CM | POA: Insufficient documentation

## 2017-08-29 DIAGNOSIS — R1032 Left lower quadrant pain: Secondary | ICD-10-CM | POA: Insufficient documentation

## 2017-08-29 DIAGNOSIS — Z79899 Other long term (current) drug therapy: Secondary | ICD-10-CM | POA: Insufficient documentation

## 2017-08-29 DIAGNOSIS — R102 Pelvic and perineal pain: Secondary | ICD-10-CM | POA: Diagnosis not present

## 2017-08-29 DIAGNOSIS — R69 Illness, unspecified: Secondary | ICD-10-CM | POA: Diagnosis not present

## 2017-08-29 DIAGNOSIS — R109 Unspecified abdominal pain: Secondary | ICD-10-CM | POA: Diagnosis not present

## 2017-08-29 LAB — CBC WITH DIFFERENTIAL/PLATELET
Basophils Absolute: 0 10*3/uL (ref 0.0–0.1)
Basophils Relative: 0 %
EOS PCT: 2 %
Eosinophils Absolute: 0.1 10*3/uL (ref 0.0–0.7)
HCT: 36.5 % (ref 36.0–46.0)
Hemoglobin: 12.3 g/dL (ref 12.0–15.0)
LYMPHS PCT: 29 %
Lymphs Abs: 2 10*3/uL (ref 0.7–4.0)
MCH: 31.2 pg (ref 26.0–34.0)
MCHC: 33.7 g/dL (ref 30.0–36.0)
MCV: 92.6 fL (ref 78.0–100.0)
MONO ABS: 0.5 10*3/uL (ref 0.1–1.0)
MONOS PCT: 7 %
Neutro Abs: 4.4 10*3/uL (ref 1.7–7.7)
Neutrophils Relative %: 62 %
PLATELETS: 220 10*3/uL (ref 150–400)
RBC: 3.94 MIL/uL (ref 3.87–5.11)
RDW: 12.6 % (ref 11.5–15.5)
WBC: 7.1 10*3/uL (ref 4.0–10.5)

## 2017-08-29 LAB — URINALYSIS, ROUTINE W REFLEX MICROSCOPIC
BILIRUBIN URINE: NEGATIVE
Glucose, UA: NEGATIVE mg/dL
Ketones, ur: NEGATIVE mg/dL
Leukocytes, UA: NEGATIVE
NITRITE: NEGATIVE
PROTEIN: NEGATIVE mg/dL
pH: 5.5 (ref 5.0–8.0)

## 2017-08-29 LAB — COMPREHENSIVE METABOLIC PANEL
ALT: 14 U/L (ref 14–54)
AST: 16 U/L (ref 15–41)
Albumin: 3.4 g/dL — ABNORMAL LOW (ref 3.5–5.0)
Alkaline Phosphatase: 82 U/L (ref 38–126)
Anion gap: 7 (ref 5–15)
BUN: 9 mg/dL (ref 6–20)
CHLORIDE: 104 mmol/L (ref 101–111)
CO2: 26 mmol/L (ref 22–32)
Calcium: 8.4 mg/dL — ABNORMAL LOW (ref 8.9–10.3)
Creatinine, Ser: 0.66 mg/dL (ref 0.44–1.00)
Glucose, Bld: 109 mg/dL — ABNORMAL HIGH (ref 65–99)
POTASSIUM: 3.9 mmol/L (ref 3.5–5.1)
Sodium: 137 mmol/L (ref 135–145)
TOTAL PROTEIN: 7.5 g/dL (ref 6.5–8.1)
Total Bilirubin: 0.3 mg/dL (ref 0.3–1.2)

## 2017-08-29 LAB — PREGNANCY, URINE: PREG TEST UR: NEGATIVE

## 2017-08-29 LAB — URINALYSIS, MICROSCOPIC (REFLEX)
RBC / HPF: NONE SEEN RBC/hpf (ref 0–5)
WBC UA: NONE SEEN WBC/hpf (ref 0–5)

## 2017-08-29 MED ORDER — SODIUM CHLORIDE 0.9 % IV BOLUS
1000.0000 mL | Freq: Once | INTRAVENOUS | Status: AC
  Administered 2017-08-29: 1000 mL via INTRAVENOUS

## 2017-08-29 MED ORDER — KETOROLAC TROMETHAMINE 15 MG/ML IJ SOLN
INTRAMUSCULAR | Status: AC
  Filled 2017-08-29: qty 1

## 2017-08-29 MED ORDER — HYDROMORPHONE HCL 1 MG/ML IJ SOLN
1.0000 mg | Freq: Once | INTRAMUSCULAR | Status: AC
  Administered 2017-08-29: 1 mg via INTRAVENOUS
  Filled 2017-08-29: qty 1

## 2017-08-29 MED ORDER — KETOROLAC TROMETHAMINE 15 MG/ML IJ SOLN
15.0000 mg | Freq: Once | INTRAMUSCULAR | Status: AC
  Administered 2017-08-29: 15 mg via INTRAVENOUS
  Filled 2017-08-29: qty 1

## 2017-08-29 MED ORDER — OXYCODONE-ACETAMINOPHEN 5-325 MG PO TABS: 1.0000 | ORAL_TABLET | Freq: Four times a day (QID) | ORAL | 0 refills | Status: DC | PRN

## 2017-08-29 MED ORDER — ONDANSETRON HCL 4 MG/2ML IJ SOLN
4.0000 mg | Freq: Once | INTRAMUSCULAR | Status: AC
  Administered 2017-08-29: 4 mg via INTRAVENOUS
  Filled 2017-08-29: qty 2

## 2017-08-29 MED ORDER — KETOROLAC TROMETHAMINE 15 MG/ML IJ SOLN: 15.0000 mg | Freq: Once | INTRAMUSCULAR | Status: DC

## 2017-08-29 NOTE — ED Notes (Signed)
Patient transported to Ultrasound 

## 2017-08-29 NOTE — ED Provider Notes (Signed)
[facility] EMERGENCY DEPARTMENT Provider Note   CSN: [id number] Arrival date & time: 08/29/17  1044     History   Chief Complaint Chief Complaint  Patient presents with  . Abdominal Pain    HPI Francene Eilleen Davoli is a 47 y.o. female.  Patient is a 47 year old female with a history of tuberous sclerosis, seizures, chronic back pain and morbid obesity who is presenting today with sudden onset of abdominal pain that started yesterday morning which was in the left lower quadrant of her abdomen.  The pain is sharp and stabbing and very uncomfortable.  She also states when the pain starts she feels that she has to have a bowel movement.  Yesterday she had several loose stools.  She states before she went to work yesterday afternoon the pain spontaneously resolved.  Then this morning around 6 AM the pain returned and now is radiating into her left flank.  It is severe pain 10 out of 10 and makes her feel like she needs to have constant bowel movements.  She has had some loose stool today but the pain has not resolved.  She has never had anything like this before.  She denies any vaginal discharge or urinary symptoms.  No chest pain or shortness of breath.  The history is provided by the patient.  Abdominal Pain   This is a new problem. The current episode started yesterday. The problem occurs daily. The problem has been rapidly worsening. The pain is associated with an unknown factor. The pain is located in the LLQ (radiates to the left flank). The quality of the pain is sharp, throbbing and shooting. The pain is at a severity of 10/10. The pain is severe. Associated symptoms include diarrhea and nausea. Pertinent negatives include fever, vomiting, dysuria, frequency and hematuria. Nothing aggravates the symptoms. Nothing relieves the symptoms.    Past Medical History:  Diagnosis Date  . Arthritis   . Asthma   . Back pain   . Chronic back pain   . Incomplete rotator cuff tear  or rupture of left shoulder, not specified as traumatic 04/07/2017  . Seizures (HCC)   . Tuberous sclerosis [facility])     Patient Active Problem List   Diagnosis Date Noted  . Incomplete rotator cuff tear or rupture of left shoulder, not specified as traumatic 04/07/2017  . Left rotator cuff tear 04/07/2017  . Left shoulder pain 01/01/2017  . Pain in right hip 11/10/2016  . Chronic right-sided low back pain without sciatica 11/10/2016  . 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 Neuropathy 06/20/2016  . Chronic back pain 05/01/2016  . Migraine without aura and without status migrainosus, not intractable 02/11/2015  . 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 ABLATION     cervical  . CESAREAN SECTION     x3  . SHOULDER ARTHROSCOPY WITH ROTATOR CUFF REPAIR Left 04/07/2017   Procedure: SHOULDER ARTHROSCOPY WITH ROTATOR CUFF REPAIR, ACROMIOPLASTY AND EXTENSIVE DEBRIDEMENT;  Surgeon: Teryl Lucy, MD;  Location: [facility] OR;  Service: Orthopedics;  Laterality: Left;     OB History   None      Home Medications    Prior to Admission medications   Medication Sig Start Date End Date Taking? Authorizing Provider  albuterol (VENTOLIN HFA) 108 (90 Base) MCG/ACT inhaler Inhale 2 puffs into the lungs every 6 (six) hours as needed  for wheezing or shortness of breath.  09/13/15   [provider]  baclofen (LIORESAL) 10 MG tablet Take 1-2 tablets (10-20 mg total) by mouth 3 (three) times daily as needed for muscle spasms. Depends on pain level if takes 1-2 tablets 04/07/17   Teryl Lucy, MD  Calcium Carb-Cholecalciferol (CALCIUM 600 + D PO) Take 1 tablet by mouth daily.    [provider]  clotrimazole (LOTRIMIN) 1 % cream Apply to affected area 2 times daily 07/02/17   Raeford Razor, MD  Diclofenac Sodium CR  (VOLTAREN-XR) 100 MG 24 hr tablet Take 1 tablet (100 mg total) by mouth daily. 08/26/17   Palumbo, April, MD  lidocaine (LIDODERM) 5 % Place 1 patch onto the skin daily. Remove & Discard patch within 12 hours or as directed by MD Patient taking differently: Place 1-2 patches onto the skin daily. Remove & Discard patch within 12 hours or as directed by MD 07/22/16   Molpus, Jonny Ruiz, MD  methocarbamol (ROBAXIN) 500 MG tablet Take 1 tablet (500 mg total) by mouth 2 (two) times daily. 08/26/17   Palumbo, April, MD  ondansetron (ZOFRAN) 4 MG tablet Take 1 tablet (4 mg total) by mouth every 8 (eight) hours as needed for nausea or vomiting. 04/07/17   Teryl Lucy, MD  oxyCODONE (ROXICODONE) 5 MG immediate release tablet Take 1-2 tablets (5-10 mg total) by mouth every 4 (four) hours as needed for severe pain. 04/07/17   Teryl Lucy, MD  PHENobarbital (LUMINAL) 97.2 MG tablet Take 1 and 1 half tablets by mouth each evening 07/13/17   Van Clines, MD  predniSONE (DELTASONE) 20 MG tablet Take 2 tablets (40 mg total) by mouth daily. 04/13/17   Gwyneth Sprout, MD  sennosides-docusate sodium (SENOKOT-S) 8.6-50 MG tablet Take 2 tablets by mouth daily. 04/07/17   Teryl Lucy, MD    Family History Family History  Problem Relation Age of Onset  . Breast cancer Maternal Grandmother     Social History Social History   Tobacco Use  . Smoking status: Light Tobacco Smoker    Packs/day: 0.25    Types: Cigarettes  . Smokeless tobacco: Never Used  Substance Use Topics  . Alcohol use: No  . Drug use: No     Allergies   Lamictal [lamotrigine] and Gabapentin   Review of Systems Review of Systems  Constitutional: Negative for fever.  Gastrointestinal: Positive for abdominal pain, diarrhea and nausea. Negative for vomiting.  Genitourinary: Negative for dysuria, frequency and hematuria.  All other systems reviewed and are negative.    Physical Exam Updated Vital Signs BP (!) 134/97 (BP Location:  Right Arm)   Pulse 63   Temp (!) 97.5 F (36.4 C) (Oral)   Resp 18   Ht  (1.626 m)   Wt (!) 163.3 kg (360 lb)   LMP 08/23/2017   SpO2 100%   BMI 61.79 kg/m   Physical Exam  Constitutional: She is oriented to person, place, and time. She appears well-developed and well-nourished. She appears distressed.  Appears uncomfortable  HENT:  Head: Normocephalic and atraumatic.  Mouth/Throat: Oropharynx is clear and moist.  Eyes: Pupils are equal, round, and reactive to light. Conjunctivae and EOM are normal.  Neck: Normal range of motion. Neck supple.  Cardiovascular: Normal rate, regular rhythm and intact distal pulses.  No murmur heard. Pulmonary/Chest: Effort normal and breath sounds normal. No respiratory distress. She has no wheezes. She has no rales.  Abdominal: Soft. She exhibits no distension. There is tenderness  in the left lower quadrant. There is CVA tenderness. There is no rebound and no guarding.  Musculoskeletal: Normal range of motion. She exhibits no edema or tenderness.  Neurological: She is alert and oriented to person, place, and time.  Skin: Skin is warm and dry. No rash noted. No erythema. There is pallor.  Psychiatric: She has a normal mood and affect. Her behavior is normal.  Nursing note and vitals reviewed.    ED Treatments / Results  Labs (all labs ordered are listed, but only abnormal results are displayed) Labs Reviewed  COMPREHENSIVE METABOLIC PANEL - Abnormal; Notable for the following components:      Result Value   Glucose, Bld 109 (*)    Calcium 8.4 (*)    Albumin 3.4 (*)    All other components within normal limits  URINALYSIS, ROUTINE W REFLEX MICROSCOPIC - Abnormal; Notable for the following components:   Color, Urine STRAW (*)    Specific Gravity, Urine <1.005 (*)    Hgb urine dipstick TRACE (*)    All other components within normal limits  URINALYSIS, MICROSCOPIC (REFLEX) - Abnormal; Notable for the following components:   Bacteria, UA  RARE (*)    All other components within normal limits  CBC WITH DIFFERENTIAL/PLATELET  PREGNANCY, URINE  POC URINE PREG, ED    EKG None  Radiology Ct Renal Stone Study  Result Date: 08/29/2017 CLINICAL DATA:  Abdominal pain. Flank pain. Urinary frequency. Diarrhea. EXAM: CT ABDOMEN AND PELVIS WITHOUT CONTRAST TECHNIQUE: Multidetector CT imaging of the abdomen and pelvis was performed following the standard protocol without IV contrast. COMPARISON:  11/10/2016 pelvic radiograph. FINDINGS: Lower chest: No significant pulmonary nodules or acute consolidative airspace disease. Hepatobiliary: Normal liver size. No liver mass. Normal gallbladder with no radiopaque cholelithiasis. No biliary ductal dilatation. Pancreas: Normal, with no mass or duct dilation. Spleen: Normal size. No mass. Adrenals/Urinary Tract: Normal adrenals. No renal stones. No hydronephrosis. No contour deforming renal masses. Normal bladder. Stomach/Bowel: Normal non-distended stomach. Normal caliber small bowel with no small bowel wall thickening. Normal appendix. Normal large bowel with no diverticulosis, large bowel wall thickening or pericolonic fat stranding. Vascular/Lymphatic: Normal caliber abdominal aorta. No pathologically enlarged lymph nodes in the abdomen or pelvis. Reproductive: Grossly normal uterus.  No adnexal mass. Other: No pneumoperitoneum, ascites or focal fluid collection. Small fat containing right periumbilical hernia. Musculoskeletal: No aggressive appearing focal osseous lesions. Mild thoracolumbar spondylosis. IMPRESSION: 1. No acute abnormality. No urolithiasis. No hydronephrosis. No evidence of bowel obstruction or acute bowel inflammation. 2. Small fat containing right periumbilical hernia. Electronically Signed   By: Delbert Phenix M.D.   On: 08/29/2017 12:18    Procedures Procedures (including critical care time)  Medications Ordered in ED Medications  HYDROmorphone (DILAUDID) injection 1 mg (has no  administration in time range)  ondansetron (ZOFRAN) injection 4 mg (has no administration in time range)  sodium chloride 0.9 % bolus 1,000 mL (1,000 mLs Intravenous New Bag/Given 08/29/17 1147)     Initial Impression / Assessment and Plan / ED Course  I have reviewed the triage vital signs and the nursing notes.  Pertinent labs & imaging results that were available during my care of the patient were reviewed by me and considered in my medical decision making (see chart for details).    Pt with symptoms consistent with kidney stone.  Denies infectious sx but is having diarrhea when pain starts.  Lower concern for diverticulitis and no risk factors or history suggestive of AAA.  No  hx suggestive of GU source (discharge).   Will hydrate, treat pain and ensure no infection with UA, CBC, CMP and will get stone study to further eval.  1:02 PM Patient's labs are reassuring with a normal CBC, UA with only trace hemoglobin but no red cells seen, CMP without acute findings and renal CT without evidence of bowel abnormality or kidney stone.  On repeat evaluation patient states the pain is improved after pain medication but still present.  Will do ultrasound to rule out ovarian torsion.  3:01 PM Ultrasound was unable to see patient's ovaries.  Despite several attempts unable to see the ovary.  There is no free fluid present.  However cannot rule out torsion because unable to see the patient's ovaries.  Patient is still having discomfort.  Will discuss with OB/GYN for further recommendation.  3:55 PM Spoke with Dr. Vergie Living at this time he states we do not need to do any further imaging.  He recommended pain control and follow-up as an outpatient with patient's OB/GYN.  He states even if there was torsion at this point it would be a matter of pain control.  As long as patient's pain is controlled she can go home but if it were to return [facility]  Final Clinical Impressions(s) / ED  Diagnoses   Final diagnoses:  Left lower quadrant pain    ED Discharge Orders    None       Gwyneth Sprout, MD 08/29/17 1642

## 2017-08-29 NOTE — ED Notes (Signed)
Patient transported to CT 

## 2017-08-29 NOTE — Discharge Instructions (Addendum)
Your ovaries were unable to be seen on ultrasound today so we cannot say if they are twisted or not.  There was no sign of cysts or swelling on the CAT scan.

## 2017-08-29 NOTE — ED Notes (Signed)
Pt out of room for testing. 

## 2017-08-29 NOTE — ED Triage Notes (Signed)
Patient states that she had some intermittent pain to her left pelvic region to her left flank yesterday - she reports that she thought she had to have a BM - it went away until this am at 6 am and she continues to have the pain  - reports Nausea

## 2017-08-31 ENCOUNTER — Ambulatory Visit: Payer: Managed Care, Other (non HMO) | Admitting: Family Medicine

## 2017-09-01 ENCOUNTER — Other Ambulatory Visit: Payer: Self-pay

## 2017-09-01 ENCOUNTER — Ambulatory Visit: Payer: Managed Care, Other (non HMO) | Admitting: Neurology

## 2017-09-01 ENCOUNTER — Encounter: Payer: Self-pay | Admitting: Neurology

## 2017-09-01 DIAGNOSIS — G40109 Localization-related (focal) (partial) symptomatic epilepsy and epileptic syndromes with simple partial seizures, not intractable, without status epilepticus: Secondary | ICD-10-CM

## 2017-09-01 MED ORDER — PHENOBARBITAL 97.2 MG PO TABS: ORAL_TABLET | ORAL | 3 refills | Status: DC

## 2017-09-01 NOTE — Patient Instructions (Signed)
Wishing you well! Continue Phenobarbital 97.2mg  1.5 tablets daily. Follow-up in 1 year, call for any changes  Seizure Precautions: 1. If medication has been prescribed for you to prevent seizures, take it exactly as directed.  Do not stop taking the medicine without talking to your doctor first, even if you have not had a seizure in a long time.   2. Avoid activities in which a seizure would cause danger to yourself or to others.  Don't operate dangerous machinery, swim alone, or climb in high or dangerous places, such as on ladders, roofs, or girders.  Do not drive unless your doctor says you may.  3. If you have any warning that you may have a seizure, lay down in a safe place where you can't hurt yourself.    4.  No driving for 6 months from last seizure, as per [facility].   Please refer to the following link on the [employer]'s website for more information: http://www.epilepsyfoundation.org/answerplace/Social/driving/drivingu.cfm   5.  Maintain good sleep hygiene. Avoid alcohol.  6.  Notify your neurology if you are planning pregnancy or if you become pregnant.  7.  Contact your doctor if you have any problems that may be related to the medicine you are taking.  8.  Call 911 and bring the patient back to the ED if:        A.  The seizure lasts longer than 5 minutes.       B.  The patient doesn't awaken shortly after the seizure  C.  The patient has new problems such as difficulty seeing, speaking or moving  D.  The patient was injured during the seizure  E.  The patient has a temperature over 102 F (39C)  F.  The patient vomited and now is having trouble breathing

## 2017-09-01 NOTE — Progress Notes (Signed)
NEUROLOGY FOLLOW UP OFFICE NOTE  Misty Foster [id number] February 16, 1971  HISTORY OF PRESENT ILLNESS: I had the pleasure of seeing Misty Foster in follow-up in the neurology clinic on 09/01/2017.  The patient was last seen more than a year ago for seizures suggestive of focal to bilateral tonic-clonic epilepsy. 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 EEG in February 2013 reported as normal. There is also note of non-epileptic events, however I do not find any records on this. She continues on Phenobarbital 97.2mg  1.5 tabs daily with no seizures since prior to June 2017 (when she moved to [facility]). She was reporting numbness and tingling in her hands and feet, EMG of the right UE and LE was normal. She denies any paresthesias at this time. She was having headaches last March but was under a lot of stress at that time. She has had surgery on her shoulder and ankle and continues to struggle with weight loss.   HPI 06/17/2016: This is a pleasant 47 yo RH woman with seizures since age 3 that she described as focal, then had her first and only generalized tonic-clonic seizure at age 35. She had been living in [place] and seeing the [facility] at [place], records were reviewed. 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 EEG in February 2013 reported as normal. She describes her focal seizures as starting with an aura where she sees herself across the room. Then either arm would start having a tremor, sometimes starting from the forearm and traveling up to her shoulder lasting a few seconds. She would have speech difficulties and feel diffusely weak after. She denies any confusion, stating she had an episode of confusion a  long time ago that was attributed to an over the counter medication, she would wake up "disillusioned," not knowing where she was. She states Benadryl and Splenda caused issues where she woke up and could not see, feeling disoriented. This occurred before June 2017 (when she left [place]). She has been on Phenobarbital since the beginning ("I love my Phenobarbital"), she has tried different medications but would have reactions to all of them. She recalls taking Dilantin, then Tegretol, which both worked great. She tried Lamictal, Gabapentin (dizzy/lightheaded), and Topamax (started losing vision), which did not help. She took Trileptal intermittently but it caused forgetfulness. She reports staring spells with some of her seizures, this is infrequent and has not been happening recently. She denies any olfactory/gustatory hallucinations, deja vu, rising epigastric sensation, focal numbness/tingling/weakness, myoclonic jerks. She has paresthesias in her hands and feet and states that blood tests have been done in the past. She has been on current dose of Phenobarbital for the past 3-4 years without side effects. She denies any headaches, dizziness, diplopia, bowel/bladder dysfunction. She has chronic pain in her neck down her spine, spasms across her abdomen, knee pain. She reports a history of cervical radiculopathy and right rotator cuff injury.   Epilepsy Risk Factors:  Her father had seizures when he was younger. She had bacterial meningitis in 1994. Otherwise she had a normal birth and early development.  There is no history of febrile convulsions, significant traumatic brain injury, neurosurgical procedures, or family history of seizures.  Prior AEDs: Dilantin, Tegretol, Trileptal, Lamictal, Gabapentin, Topamax  PAST MEDICAL HISTORY: Past Medical History:  Diagnosis Date  .  Arthritis   . Asthma   . Back pain   . Chronic back pain   . Incomplete rotator cuff tear or rupture of left shoulder, not  specified as traumatic 04/07/2017  . Seizures (HCC)   . Tuberous sclerosis (HCC)     MEDICATIONS: Current Outpatient Medications on File Prior to Visit  Medication Sig Dispense Refill  . albuterol (VENTOLIN HFA) 108 (90 Base) MCG/ACT inhaler Inhale 2 puffs into the lungs every 6 (six) hours as needed for wheezing or shortness of breath.     . baclofen (LIORESAL) 10 MG tablet Take 1-2 tablets (10-20 mg total) by mouth 3 (three) times daily as needed for muscle spasms. Depends on pain level if takes 1-2 tablets 50 each 0  . Calcium Carb-Cholecalciferol (CALCIUM 600 + D PO) Take 1 tablet by mouth daily.    . clotrimazole (LOTRIMIN) 1 % cream Apply to affected area 2 times daily 15 g 0  . Diclofenac Sodium CR (VOLTAREN-XR) 100 MG 24 hr tablet Take 1 tablet (100 mg total) by mouth daily. 10 tablet 0  . lidocaine (LIDODERM) 5 % Place 1 patch onto the skin daily. Remove & Discard patch within 12 hours or as directed by MD (Patient taking differently: Place 1-2 patches onto the skin daily. Remove & Discard patch within 12 hours or as directed by MD) 15 patch 0  . methocarbamol (ROBAXIN) 500 MG tablet Take 1 tablet (500 mg total) by mouth 2 (two) times daily. 20 tablet 0  . ondansetron (ZOFRAN) 4 MG tablet Take 1 tablet (4 mg total) by mouth every 8 (eight) hours as needed for nausea or vomiting. 30 tablet 0  . oxyCODONE (ROXICODONE) 5 MG immediate release tablet Take 1-2 tablets (5-10 mg total) by mouth every 4 (four) hours as needed for severe pain. 50 tablet 0  . oxyCODONE-acetaminophen (PERCOCET/ROXICET) 5-325 MG tablet Take 1-2 tablets by mouth every 6 (six) hours as needed for severe pain. 10 tablet 0  . PHENobarbital (LUMINAL) 97.2 MG tablet Take 1 and 1 half tablets by mouth each evening 105 tablet 0  . predniSONE (DELTASONE) 20 MG tablet Take 2 tablets (40 mg total) by mouth daily. 10 tablet 0  . sennosides-docusate sodium (SENOKOT-S) 8.6-50 MG tablet Take 2 tablets by mouth daily. 30 tablet 1    No current facility-administered medications on file prior to visit.     ALLERGIES: Allergies  Allergen Reactions  . Lamictal [Lamotrigine] Nausea And Vomiting and Rash  . Gabapentin Other (See Comments)    auras    FAMILY HISTORY: Family History  Problem Relation Age of Onset  . Breast cancer Maternal Grandmother     SOCIAL HISTORY: Social History   Socioeconomic History  . Marital status: Legally Separated    Spouse name: Not on file  . Number of children: Not on file  . Years of education: Not on file  . Highest education level: Not on file  Occupational History  . Not on file  Social Needs  . Financial resource strain: Not on file  . Food insecurity:    Worry: Not on file    Inability: Not on file  . Transportation needs:    Medical: Not on file    Non-medical: Not on file  Tobacco Use  . Smoking status: Light Tobacco Smoker    Packs/day: 0.25    Types: Cigarettes  . Smokeless tobacco: Never Used  Substance and Sexual Activity  . Alcohol use: No  . Drug use: No  .  Sexual activity: Not on file  Lifestyle  . Physical activity:    Days per week: Not on file    Minutes per session: Not on file  . Stress: Not on file  Relationships  . Social connections:    Talks on phone: Not on file    Gets together: Not on file    Attends religious service: Not on file    Active member of club or organization: Not on file    Attends meetings of clubs or organizations: Not on file    Relationship status: Not on file  . Intimate partner violence:    Fear of current or ex partner: Not on file    Emotionally abused: Not on file    Physically abused: Not on file    Forced sexual activity: Not on file  Other Topics Concern  . Not on file  Social History Narrative  . Not on file    REVIEW OF SYSTEMS: Constitutional: No fevers, chills, or sweats, no generalized fatigue, change in appetite Eyes: No visual changes, double vision, eye pain Ear, nose and throat: No  hearing loss, ear pain, nasal congestion, sore throat Cardiovascular: No chest pain, palpitations Respiratory:  No shortness of breath at rest or with exertion, wheezes GastrointestinaI: No nausea, vomiting, diarrhea, abdominal pain, fecal incontinence Genitourinary:  No dysuria, urinary retention or frequency Musculoskeletal:  + neck pain, back pain Integumentary: No rash, pruritus, skin lesions Neurological: as above Psychiatric: No depression, insomnia, anxiety Endocrine: No palpitations, fatigue, diaphoresis, mood swings, change in appetite, change in weight, increased thirst Hematologic/Lymphatic:  No anemia, purpura, petechiae. Allergic/Immunologic: no itchy/runny eyes, nasal congestion, recent allergic reactions, rashes  PHYSICAL EXAM: Vitals:   09/01/17 1501  BP: 134/86  Pulse: 82  SpO2: 98%   General: No acute distress Head:  Normocephalic/atraumatic Neck: supple, no paraspinal tenderness, full range of motion Heart:  Regular rate and rhythm Lungs:  Clear to auscultation bilaterally Back: No paraspinal tenderness Skin/Extremities: No rash, no edema Neurological Exam: alert and oriented to person, place, and time. No aphasia or dysarthria. Fund of knowledge is appropriate.  Recent and remote memory are intact.  Attention and concentration are normal.    Able to name objects and repeat phrases. Cranial nerves: Pupils equal, round, reactive to light.  Extraocular movements intact with no nystagmus. Visual fields full. Facial sensation intact. No facial asymmetry. Tongue, uvula, palate midline.  Motor: Bulk and tone normal, muscle strength 5/5 throughout with no pronator drift.  Sensation to light touch intact.  No extinction to double simultaneous stimulation.  Deep tendon reflexes 2+ throughout, toes downgoing.  Finger to nose testing intact.  Gait narrow-based and steady, able to tandem walk adequately.  Romberg negative.  IMPRESSION: This is a pleasant 47 yo RH woman with a  history of seizures since childhood. 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 EEG in February 2013 reported as normal. There is also note of non-epileptic events, however I do not find any records on this. She continues to do well on Phenobarbital 97.2mg  1.5 tabs daily with no seizures or seizure-like symptoms since prior to June 2017 (when she moved to [facility]). She is dealing with several musculoskeletal issues at this time. She is aware of [place] driving laws to stop driving after a seizure, until 6 months seizure-free. She will follow-up in 1 year and knows to call for any changes  Thank you for allowing me to participate in her care.  Please do not hesitate to call for any questions or concerns.  The duration of this appointment visit was 15 minutes of face-to-face time with the patient.  Greater than 50% of this time was spent in counseling, explanation of diagnosis, planning of further management, and coordination of care.   Patrcia Dolly, M.D.   CC: Levonne Lapping, NP

## 2017-09-02 DIAGNOSIS — M25561 Pain in right knee: Secondary | ICD-10-CM | POA: Diagnosis not present

## 2017-09-07 ENCOUNTER — Encounter

## 2017-09-07 ENCOUNTER — Ambulatory Visit: Payer: Managed Care, Other (non HMO) | Admitting: Family Medicine

## 2017-09-10 ENCOUNTER — Encounter: Payer: Self-pay | Admitting: Neurology

## 2017-09-11 ENCOUNTER — Ambulatory Visit: Payer: Managed Care, Other (non HMO) | Admitting: Family Medicine

## 2017-09-14 ENCOUNTER — Ambulatory Visit: Payer: Managed Care, Other (non HMO) | Admitting: Neurology

## 2017-09-14 ENCOUNTER — Encounter

## 2017-09-30 ENCOUNTER — Other Ambulatory Visit: Payer: Self-pay | Admitting: Orthopedic Surgery

## 2017-09-30 DIAGNOSIS — M25561 Pain in right knee: Secondary | ICD-10-CM

## 2017-09-30 DIAGNOSIS — M1711 Unilateral primary osteoarthritis, right knee: Secondary | ICD-10-CM | POA: Diagnosis not present

## 2017-10-01 DIAGNOSIS — Z136 Encounter for screening for cardiovascular disorders: Secondary | ICD-10-CM | POA: Diagnosis not present

## 2017-10-01 DIAGNOSIS — Z716 Tobacco abuse counseling: Secondary | ICD-10-CM | POA: Diagnosis not present

## 2017-10-01 DIAGNOSIS — J45909 Unspecified asthma, uncomplicated: Secondary | ICD-10-CM | POA: Diagnosis not present

## 2017-10-01 DIAGNOSIS — M25512 Pain in left shoulder: Secondary | ICD-10-CM | POA: Diagnosis not present

## 2017-10-01 DIAGNOSIS — R7303 Prediabetes: Secondary | ICD-10-CM | POA: Diagnosis not present

## 2017-10-01 DIAGNOSIS — R69 Illness, unspecified: Secondary | ICD-10-CM | POA: Diagnosis not present

## 2017-10-01 DIAGNOSIS — G40909 Epilepsy, unspecified, not intractable, without status epilepticus: Secondary | ICD-10-CM | POA: Diagnosis not present

## 2017-10-01 DIAGNOSIS — Z13220 Encounter for screening for lipoid disorders: Secondary | ICD-10-CM | POA: Diagnosis not present

## 2017-10-01 DIAGNOSIS — M549 Dorsalgia, unspecified: Secondary | ICD-10-CM | POA: Diagnosis not present

## 2017-10-01 DIAGNOSIS — Z720 Tobacco use: Secondary | ICD-10-CM | POA: Diagnosis not present

## 2017-10-06 ENCOUNTER — Ambulatory Visit
Admission: RE | Admit: 2017-10-06 | Discharge: 2017-10-06 | Disposition: A | Discharge status: Home / Self Care | Payer: Managed Care, Other (non HMO) | Source: Ambulatory Visit | Attending: Orthopedic Surgery | Admitting: Orthopedic Surgery

## 2017-10-06 ENCOUNTER — Inpatient Hospital Stay
Admission: RE | Admit: 2017-10-06 | Discharge: 2017-10-06 | Disposition: A | Discharge status: Home / Self Care | Payer: Managed Care, Other (non HMO) | Source: Ambulatory Visit | Attending: Orthopedic Surgery | Admitting: Orthopedic Surgery

## 2017-10-06 DIAGNOSIS — M25561 Pain in right knee: Secondary | ICD-10-CM

## 2017-10-07 ENCOUNTER — Emergency Department (HOSPITAL_BASED_OUTPATIENT_CLINIC_OR_DEPARTMENT_OTHER)
Admission: EM | Admit: 2017-10-07 | Discharge: 2017-10-07 | Disposition: A | Discharge status: Home / Self Care | Attending: Emergency Medicine | Admitting: Emergency Medicine

## 2017-10-07 ENCOUNTER — Other Ambulatory Visit: Payer: Self-pay

## 2017-10-07 ENCOUNTER — Emergency Department (HOSPITAL_BASED_OUTPATIENT_CLINIC_OR_DEPARTMENT_OTHER)

## 2017-10-07 ENCOUNTER — Encounter (HOSPITAL_BASED_OUTPATIENT_CLINIC_OR_DEPARTMENT_OTHER): Payer: Self-pay | Admitting: *Deleted

## 2017-10-07 ENCOUNTER — Other Ambulatory Visit: Payer: Self-pay | Admitting: *Deleted

## 2017-10-07 DIAGNOSIS — Z79899 Other long term (current) drug therapy: Secondary | ICD-10-CM | POA: Diagnosis not present

## 2017-10-07 DIAGNOSIS — Y9289 Other specified places as the place of occurrence of the external cause: Secondary | ICD-10-CM | POA: Diagnosis not present

## 2017-10-07 DIAGNOSIS — S52614A Nondisplaced fracture of right ulna styloid process, initial encounter for closed fracture: Secondary | ICD-10-CM | POA: Insufficient documentation

## 2017-10-07 DIAGNOSIS — F17210 Nicotine dependence, cigarettes, uncomplicated: Secondary | ICD-10-CM | POA: Insufficient documentation

## 2017-10-07 DIAGNOSIS — J45909 Unspecified asthma, uncomplicated: Secondary | ICD-10-CM | POA: Insufficient documentation

## 2017-10-07 DIAGNOSIS — G5601 Carpal tunnel syndrome, right upper limb: Secondary | ICD-10-CM

## 2017-10-07 DIAGNOSIS — Y990 Civilian activity done for income or pay: Secondary | ICD-10-CM | POA: Diagnosis not present

## 2017-10-07 DIAGNOSIS — X503XXA Overexertion from repetitive movements, initial encounter: Secondary | ICD-10-CM | POA: Insufficient documentation

## 2017-10-07 DIAGNOSIS — Y9389 Activity, other specified: Secondary | ICD-10-CM | POA: Insufficient documentation

## 2017-10-07 DIAGNOSIS — S52614K Nondisplaced fracture of right ulna styloid process, subsequent encounter for closed fracture with nonunion: Secondary | ICD-10-CM | POA: Diagnosis not present

## 2017-10-07 DIAGNOSIS — S6991XA Unspecified injury of right wrist, hand and finger(s), initial encounter: Secondary | ICD-10-CM | POA: Diagnosis present

## 2017-10-07 MED ORDER — IBUPROFEN 400 MG PO TABS
600.0000 mg | ORAL_TABLET | Freq: Once | ORAL | Status: AC
  Administered 2017-10-07: 600 mg via ORAL
  Filled 2017-10-07: qty 1

## 2017-10-07 NOTE — Discharge Instructions (Signed)
Thank you for allowing me to provide your care today in the emergency department.  Please call Dr. Shelba FlakeLandau's office to schedule a follow-up appointment.   Wear the wrist splint at all times, even while you sleep, to help with your pain.  Take 600 mg of ibuprofen with food every 6 hours to help with pain and inflammation.  Apply ice for 15 to 20 minutes up to 3-4 times a day to help with inflammation.  You can also take your home medications.  You can supplement your pain control by taking 650 mg of Tylenol every 6 hours, please make sure that you do not take more than 4000 mg of Tylenol in a 24-hour period from all sources.  Return to the emergency department if you have any fall or injury, if you develop weakness in the right arm or hand, if the right wrist or elbow becomes red, hot, or swollen to the touch, or if you develop fever or chills.

## 2017-10-07 NOTE — ED Notes (Signed)
Pt has a [facility] ice bag

## 2017-10-07 NOTE — ED Provider Notes (Signed)
[facility] EMERGENCY DEPARTMENT Provider Note   CSN: [id number] Arrival date & time: 10/07/17  1019     History   Chief Complaint Chief Complaint  Patient presents with  . Wrist pain    HPI Misty Foster is a 47 y.o. female with a history of tuberous sclerosis, and complete tear of the right rotator cuff, neuropathy, and chronic low back pain who presents to the emergency department with a chief complaint of right upper extremity pain.  The patient reports sudden onset right wrist pain that began approximately midnight while she was at work.  She works in a lab and performs many repetitive tasks.  She reports that she was pulling a piece of paper off of her printer in front of her desk when she felt sudden onset pain to the right wrist that radiates up the right forearm.  She characterizes the pain as burning.  Pain has been constant since onset, but she now feels pain in both the right wrist and elbow.  She reports over the last 4 days she has been having intermittent pain that has been similar that would temporarily improve when she would shake her right wrist.  Pain is worse with range of motion of the right wrist and elbow.  She also endorses numbness in digits 2 through 5 of the right hand.  She denies weakness, fever, chills, swelling, warmth, or erythema to the joints of the right upper extremity.  No previous right elbow or wrist surgery or injury.  No falls or new activities.   She has been treating her symptoms by applying ice to the right wrist.  The history is provided by the patient. No language interpreter was used.    Past Medical History:  Diagnosis Date  . Arthritis   . Asthma   . Back pain   . Chronic back pain   . Incomplete rotator cuff tear or rupture of left shoulder, not specified as traumatic 04/07/2017  . Seizures (HCC)   . Tuberous sclerosis [facility])     Patient Active Problem List   Diagnosis Date Noted  . Incomplete rotator cuff  tear or rupture of left shoulder, not specified as traumatic 04/07/2017  . Left rotator cuff tear 04/07/2017  . Left shoulder pain 01/01/2017  . Pain in right hip 11/10/2016  . Chronic right-sided low back pain without sciatica 11/10/2016  . 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 Neuropathy 06/20/2016  . Chronic back pain 05/01/2016  . Migraine without aura and without status migrainosus, not intractable 02/11/2015  . 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 ABLATION     cervical  . CESAREAN SECTION     x3  . SHOULDER ARTHROSCOPY WITH ROTATOR CUFF REPAIR Left 04/07/2017   Procedure: SHOULDER ARTHROSCOPY WITH ROTATOR CUFF REPAIR, ACROMIOPLASTY AND EXTENSIVE DEBRIDEMENT;  Surgeon: Teryl Lucy, MD;  Location: [facility] OR;  Service: Orthopedics;  Laterality: Left;     OB History    Gravida  4   Para  3   Term      Preterm      AB  1   Living        SAB      TAB      Ectopic      Multiple      Live Births  Home Medications    Prior to Admission medications   Medication Sig Start Date End Date Taking? Authorizing Provider  Calcium Carb-Cholecalciferol (CALCIUM 600 + D PO) Take 1 tablet by mouth daily.   Yes [provider]  lidocaine (LIDODERM) 5 % Place 1 patch onto the skin daily. Remove & Discard patch within 12 hours or as directed by MD Patient taking differently: Place 1-2 patches onto the skin daily. Remove & Discard patch within 12 hours or as directed by MD 07/22/16  Yes Molpus, Jonny RuizJohn, MD  methocarbamol (ROBAXIN) 500 MG tablet Take 1 tablet (500 mg total) by mouth 2 (two) times daily. 08/26/17  Yes Palumbo, April, MD  PHENobarbital (LUMINAL) 97.2 MG tablet Take 1 and 1 half tablets by mouth each evening 09/01/17  Yes Van ClinesAquino, Karen M, MD  acetaminophen-codeine  (TYLENOL #3) 300-30 MG tablet TK 1 T PO BID PRN 08/10/17   [provider]  albuterol (VENTOLIN HFA) 108 (90 Base) MCG/ACT inhaler Inhale 2 puffs into the lungs every 6 (six) hours as needed for wheezing or shortness of breath.  09/13/15   [provider]  baclofen (LIORESAL) 10 MG tablet Take 1-2 tablets (10-20 mg total) by mouth 3 (three) times daily as needed for muscle spasms. Depends on pain level if takes 1-2 tablets 04/07/17   Teryl LucyLandau, Joshua, MD  clotrimazole (LOTRIMIN) 1 % cream Apply to affected area 2 times daily 07/02/17   Raeford RazorKohut, Stephen, MD  Diclofenac Sodium CR (VOLTAREN-XR) 100 MG 24 hr tablet Take 1 tablet (100 mg total) by mouth daily. 08/26/17   Palumbo, April, MD  ondansetron (ZOFRAN) 4 MG tablet Take 1 tablet (4 mg total) by mouth every 8 (eight) hours as needed for nausea or vomiting. 04/07/17   Teryl LucyLandau, Joshua, MD  oxyCODONE (ROXICODONE) 5 MG immediate release tablet Take 1-2 tablets (5-10 mg total) by mouth every 4 (four) hours as needed for severe pain. 04/07/17   Teryl LucyLandau, Joshua, MD  oxyCODONE-acetaminophen (PERCOCET/ROXICET) 5-325 MG tablet Take 1-2 tablets by mouth every 6 (six) hours as needed for severe pain. 08/29/17   Gwyneth SproutPlunkett, Whitney, MD  predniSONE (DELTASONE) 20 MG tablet Take 2 tablets (40 mg total) by mouth daily. 04/13/17   Gwyneth SproutPlunkett, Whitney, MD  sennosides-docusate sodium (SENOKOT-S) 8.6-50 MG tablet Take 2 tablets by mouth daily. 04/07/17   Teryl LucyLandau, Joshua, MD    Family History Family History  Problem Relation Age of Onset  . Breast cancer Maternal Grandmother   . Alcohol abuse Father   . Lung disease Father   . Epilepsy Father     Social History Social History   Tobacco Use  . Smoking status: Heavy Tobacco Smoker    Packs/day: 0.50    Years: 2.00    Pack years: 1.00    Types: Cigarettes  . Smokeless tobacco: Never Used  Substance Use Topics  . Alcohol use: No  . Drug use: No     Allergies   Lortab [hydrocodone-acetaminophen];  Lamictal [lamotrigine]; and Gabapentin   Review of Systems Review of Systems  Constitutional: Negative for activity change, chills and fever.  Respiratory: Negative for shortness of breath.   Cardiovascular: Negative for chest pain.  Gastrointestinal: Negative for abdominal pain.  Musculoskeletal: Positive for arthralgias and myalgias. Negative for back pain and joint swelling.  Skin: Negative for rash.  Neurological: Positive for numbness.   Physical Exam Updated Vital Signs BP 99/70 (BP Location: Left Wrist)   Pulse 64   Temp 97.6 F (36.4 C) (Oral)   Resp  18   Ht 5' 4.5" (1.638 m)   Wt (!) 163.3 kg (360 lb)   LMP 09/20/2017   SpO2 99%   BMI 60.84 kg/m   Physical Exam  Constitutional: No distress.  HENT:  Head: Normocephalic.  Eyes: Conjunctivae are normal.  Neck: Neck supple.  Cardiovascular: Normal rate and regular rhythm. Exam reveals no gallop and no friction rub.  No murmur heard. Pulmonary/Chest: Effort normal. No respiratory distress.  Abdominal: Soft. She exhibits no distension.  Musculoskeletal: Normal range of motion. She exhibits tenderness. She exhibits no edema or deformity.  Diffusely tender to palpation to the right wrist and elbow.  No obvious deformities, crepitus, or step-offs.  Radial pulses are 2+ and symmetric.  Sensation is 4 out of 5 over the medial and ulnar distribution to the right wrist.  5 out of 5 sensation to the radial nerve distribution when compared to the left wrist.  Full active and passive range of motion of all digits of the right hand left wrist, elbow, and shoulder.  Increased pain with both active and passive range of motion of the right wrist.   Negative Finkelstein test.  Positive Tinel of the right wrist.  No erythema, edema, warmth to the right elbow or wrist.  Neurological: She is alert.  Skin: Skin is warm. No rash noted.  Psychiatric: Her behavior is normal.  Nursing note and vitals reviewed.  ED Treatments / Results    Labs (all labs ordered are listed, but only abnormal results are displayed) Labs Reviewed - No data to display  EKG None  Radiology Dg Elbow Complete Right  Result Date: 10/07/2017 CLINICAL DATA:  Elbow pain common no known injury, initial encounter EXAM: RIGHT ELBOW - COMPLETE 3+ VIEW COMPARISON:  None. FINDINGS: There is no evidence of fracture, dislocation, or joint effusion. There is no evidence of arthropathy or other focal bone abnormality. Soft tissues are unremarkable. IMPRESSION: No acute abnormality noted. Electronically Signed   By: Alcide Clever M.D.   On: 10/07/2017 11:40   Dg Wrist Complete Right  Result Date: 10/07/2017 CLINICAL DATA:  Acute right wrist pain.  No traumatic injury. EXAM: RIGHT WRIST - COMPLETE 3+ VIEW COMPARISON:  None. FINDINGS: No acute fracture or dislocation. Old nonunited ulnar styloid process fracture. Joint spaces are preserved. Bone mineralization is normal. Soft tissues are unremarkable. IMPRESSION: Negative. Electronically Signed   By: Obie Dredge M.D.   On: 10/07/2017 11:40   Mr Knee Right Wo Contrast  Result Date: 10/06/2017 CLINICAL DATA:  Right knee pain for 2 months.  No known injury. EXAM: MRI OF THE RIGHT KNEE WITHOUT CONTRAST TECHNIQUE: Multiplanar, multisequence MR imaging of the knee was performed. No intravenous contrast was administered. COMPARISON:  Plain films right knee 08/26/2017. FINDINGS: MENISCI Medial meniscus:  Intact. Lateral meniscus:  Intact. LIGAMENTS Cruciates:  Intact. Collaterals: Intact. Focal fluid deep to the medial collateral ligament measures 3.5 cm craniocaudal by 0.9 cm transverse by 1.7 cm AP and is consistent with MCL bursitis. CARTILAGE Patellofemoral:  Mildly degenerated. Medial:  Thinned without focal defect. Lateral:  Preserved. Joint:  No joint effusion. Popliteal Fossa:  No Baker's cyst. Extensor Mechanism:  Intact. Bones: No fracture or worrisome lesion. Small osteophytes are seen about the knee. Other: None.  IMPRESSION: Negative for meniscal or ligament tear. MCL bursitis. 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 Electronically Signed   By: Drusilla Kanner M.D.   On: 10/06/2017 09:22    Procedures Procedures (including critical care time)  Medications  Ordered in ED Medications  ibuprofen (ADVIL,MOTRIN) tablet 600 mg (600 mg Oral Given 10/07/17 1139)     Initial Impression / Assessment and Plan / ED Course  I have reviewed the triage vital signs and the nursing notes.  Pertinent labs & imaging results that were available during my care of the patient were reviewed by me and considered in my medical decision making (see chart for details).     46-year female with a history of tuberous sclerosis, and complete tear of the right rotator cuff, neuropathy, and chronic low back pain who presents to the emergency department with a chief complaint of right upper extremity pain.  On exam, the patient has a positive Tinel test of the right wrist.  History is concerning for carpal tunnel syndrome.  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  Doubt septic joint, gout, or pathologic fracture.  The patient reports that she fell and broke her right wrist when she was 47 years old.  She is established with Dr. Dion Saucier with orthopedic surgery and I will refer her for follow-up.  Right wrist brace given in the ED.  Rice therapy recommended for home treatment.  Strict return precautions given.  The patient is hemodynamically stable in no acute distress.  She is safe for discharge home at this time.  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

## 2017-10-07 NOTE — ED Triage Notes (Signed)
Patient was at work reaching a labels for printer and while she was grabbing the 3rd label, she felt a sharp, burning pain to her right wrist all the way to her elbow. And it gets worst.

## 2017-10-07 NOTE — Patient Outreach (Signed)
[facility](THN) Care Management  10/07/2017  Misty Foster July 31, 1970 [id number]   Subjective: Telephone call to patient's home  / mobile number, no answer, left HIPAA compliant voicemail message, and requested call back.      Objective: Per KPN (Knowledge Performance Now, point of care tool) and chart review, patient has has no recent hospitalizations.   Patient has ED visit today for right wrist pain.    Update from [employer]: 47 yo HR F noted to have 8+  ED visits [facility], most recent as follows:  06/29/17  Dx- Lumbago with Sciatica ;  04/13/17  Dx- Rash and non-specific skin eruption;   01/20/17 Dx- Pelvic and Perineal Pain; 12/24/17 Dx- Strain of muscle  PMH: Epilepsy, Low Back Pain, Asthma, Osteoarthritis, Obesity, Nicotine Dependence;  Elective admission 04/07/17 -04/08/17 for rotator cuff repair. Member did not respond to outreach from [place] CM.         Assessment:  Received [facility] referral on 10/06/17.  [facility] pending patient contact.      Plan: RNCM will send unsuccessful outreach  letter, [facility] pamph[facility], will call patient for 2nd telephone outreach attempt, [facility] Consult follow up, and proceed with case closure, within 10 business days if no return call.        Brinn Westby H. Gardiner Barefootooper RN, BSN, CCM [facility] Care [facility] Telephonic CM Phone: [phone] Fax: [phone]

## 2017-10-07 NOTE — ED Notes (Signed)
ED Provider at bedside. 

## 2017-10-08 ENCOUNTER — Other Ambulatory Visit: Payer: Self-pay | Admitting: *Deleted

## 2017-10-08 NOTE — Patient Outreach (Addendum)
[facility](THN) Care Management  10/08/2017  Misty Foster [birth year] [id number]   Subjective: Telephone call to patient's home / mobile number, spoke with patient, and HIPAA verified.  [facility] follow [facility], patient voiced understanding, and is in agreement to follow up.   Patient states she is doing ok, taking medications for chronic pain issues, medications have caused weight gain (prednisone), weight causing more medical issues, weight / chronic pain limits mobility, and limited mobility is causing more weight gain.  States she has been injured at work and is currently working with [employer].  States her weight loss surgery was canceled in 2018 due to shoulder infection surgery, her BMI has continued to increase, and BMI is currently 60.  States she is planning to [employer] customer service to verify deductible status, request RNCM for weight management, resume weight loss surgery process, and she is in agreement for this RNCM to send referral to [facility] for weight management.    States she will notify this RNCM if she has not received call from [facility]  within 3 weeks.   Patient gave verbal consent for [employer] RNCM to call her on mobile phone as needed.  Patient states she is able to manage self care and has assistance as needed with activities of daily living / home management.  Patient voices understanding of medical diagnosis and treatment plan.  States she notify this RNCM if assistance needed in the future and is also aware resources can be accessed through [employer] customer services.   Patient states she does not have any education material, transition of care, care coordination, transportation, community resource, or pharmacy needs at this time.  States she is very appreciative of the follow up and is in agreement to receive [facility] information.       Objective: Per KPN (Knowledge Performance Now, point of care  tool) and chart review, patient has has no recent hospitalizations.   Patient has ED visit today for right wrist pain.    Update from [employer]: 47 yo HR F noted to have 8+  [facility], most recent as follows:  06/29/17  Dx- Lumbago with Sciatica ;  04/13/17  Dx- Rash and non-specific skin eruption;   01/20/17 Dx- Pelvic and Perineal Pain; 12/24/17 Dx- Strain of muscle  PMH: Epilepsy, Low Back Pain, Asthma, Osteoarthritis, Obesity, Nicotine Dependence;  Elective admission 04/07/17 -04/08/17 for rotator cuff repair. Member did not respond to outreach from [place] CM.         Assessment:  Received [facility] Consult referral on 10/06/17.  TH[facility] Consult follow up completed, no care management needs, and will proceed with case closure.       Plan: .RNCM will send patient successful outreach letter, [facility] pamphlet, and magnet. RNCM will complete case closure due to follow up completed / no care management needs.  RNCM will refer patient to [facility] internal CM program for weight management.          Ayan Heffington H. Gardiner Barefootooper RN, BSN, CCM [facility] Care Management [facility] Telephonic CM Phone: [phone] Fax: [phone]

## 2017-10-09 ENCOUNTER — Ambulatory Visit: Payer: Self-pay | Admitting: *Deleted

## 2017-10-09 DIAGNOSIS — M25561 Pain in right knee: Secondary | ICD-10-CM | POA: Diagnosis not present

## 2017-10-21 ENCOUNTER — Encounter (HOSPITAL_BASED_OUTPATIENT_CLINIC_OR_DEPARTMENT_OTHER): Payer: Self-pay

## 2017-10-21 ENCOUNTER — Other Ambulatory Visit: Payer: Self-pay

## 2017-10-21 ENCOUNTER — Emergency Department (HOSPITAL_BASED_OUTPATIENT_CLINIC_OR_DEPARTMENT_OTHER)
Admission: EM | Admit: 2017-10-21 | Discharge: 2017-10-21 | Disposition: A | Discharge status: Home / Self Care | Payer: Managed Care, Other (non HMO) | Attending: Emergency Medicine | Admitting: Emergency Medicine

## 2017-10-21 DIAGNOSIS — G8929 Other chronic pain: Secondary | ICD-10-CM | POA: Diagnosis not present

## 2017-10-21 DIAGNOSIS — F17210 Nicotine dependence, cigarettes, uncomplicated: Secondary | ICD-10-CM | POA: Diagnosis not present

## 2017-10-21 DIAGNOSIS — M25561 Pain in right knee: Secondary | ICD-10-CM | POA: Insufficient documentation

## 2017-10-21 DIAGNOSIS — M545 Low back pain, unspecified: Secondary | ICD-10-CM

## 2017-10-21 DIAGNOSIS — Z79899 Other long term (current) drug therapy: Secondary | ICD-10-CM | POA: Diagnosis not present

## 2017-10-21 DIAGNOSIS — J45909 Unspecified asthma, uncomplicated: Secondary | ICD-10-CM | POA: Diagnosis not present

## 2017-10-21 DIAGNOSIS — R69 Illness, unspecified: Secondary | ICD-10-CM | POA: Diagnosis not present

## 2017-10-21 MED ORDER — METHYLPREDNISOLONE 4 MG PO TBPK: ORAL_TABLET | ORAL | 0 refills | Status: DC

## 2017-10-21 MED ORDER — KETOROLAC TROMETHAMINE 30 MG/ML IJ SOLN
30.0000 mg | Freq: Once | INTRAMUSCULAR | Status: AC
  Administered 2017-10-21: 30 mg via INTRAMUSCULAR
  Filled 2017-10-21: qty 1

## 2017-10-21 MED ORDER — OXYCODONE-ACETAMINOPHEN 5-325 MG PO TABS
2.0000 | ORAL_TABLET | Freq: Once | ORAL | Status: AC
Start: 2017-10-21 — End: 2017-10-21
  Administered 2017-10-21: 2 via ORAL
  Filled 2017-10-21: qty 2

## 2017-10-21 NOTE — ED Notes (Signed)
Pt verbalizes understanding of d/c instructions and denies any further needs at this time. 

## 2017-10-21 NOTE — ED Triage Notes (Signed)
Bilateral lower back pain and right knee pain since Saturday, no injury, denies groin numbness, denies incontinence, states her home pain medication is not helping

## 2017-10-21 NOTE — Discharge Instructions (Addendum)
You were seen today for chronic back pain and right knee pain.  It is very important that you follow-up with your primary physician and pain management.  You will be placed on a Medrol Dosepak for anti-inflammatory and.  Continue your home medications as prescribed.

## 2017-10-21 NOTE — ED Notes (Signed)
Pt is calling a ride in order to receive percocet

## 2017-10-21 NOTE — ED Notes (Signed)
Pt ambulated around department with much more ease than when she first arrived.

## 2017-10-21 NOTE — ED Provider Notes (Signed)
[facility] EMERGENCY DEPARTMENT Provider Note   CSN: [id number] Arrival date & time: 10/21/17  0109     History   Chief Complaint Chief Complaint  Patient presents with  . Back Pain    HPI Misty Foster is a 47 y.o. female.  HPI  This is a 47 year old female with history of morbid obesity, chronic back pain, seizures who presents with back pain and right knee pain.  Patient reports at baseline that she has back pain and "a herniated disc."  She also has had issues with her right knee in the past.  Patient states on Saturday she began to have worsening pain.  She denies any new injury or heavy lifting.  She reports bilateral nonradiating back pain.  It is worse with certain movements and with ambulation.  She has taken baclofen, meloxicam, and oxycodone at home with no relief.  She rates her pain at 9 out of 10.  She also reports right knee pain which is worse with ambulation.  She denies any fevers or overlying skin changes.  She denies any new injury to the knee.  She states that her pain has prevented her from going to work.  She states that she was recently referred to pain management by her primary physician but has not set up an appointment yet.  Patient denies any weakness, numbness, bowel or bladder difficulties.  Past Medical History:  Diagnosis Date  . Arthritis   . Asthma   . Back pain   . Chronic back pain   . Incomplete rotator cuff tear or rupture of left shoulder, not specified as traumatic 04/07/2017  . Seizures (HCC)   . 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 Incomplete rotator cuff tear or rupture of left shoulder, not specified as traumatic 04/07/2017  . Left rotator cuff tear 04/07/2017  . Left shoulder pain 01/01/2017  . Pain in right hip 11/10/2016  . Chronic right-sided low back pain without sciatica 11/10/2016  . 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 Neuropathy 06/20/2016  . Chronic back pain 05/01/2016  . Migraine without aura and without status migrainosus, not intractable 02/11/2015  . 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 ABLATION     cervical  . CESAREAN SECTION     x3  . SHOULDER ARTHROSCOPY WITH ROTATOR CUFF REPAIR Left 04/07/2017   Procedure: SHOULDER ARTHROSCOPY WITH ROTATOR CUFF REPAIR, ACROMIOPLASTY AND EXTENSIVE DEBRIDEMENT;  Surgeon: Teryl Lucy, MD;  Location: [facility] OR;  Service: Orthopedics;  Laterality: Left;     OB History    Gravida  4   Para  3   Term      Preterm      AB  1   Living        SAB      TAB      Ectopic      Multiple      Live Births               Home Medications    Prior to Admission medications   Medication Sig Start Date End Date Taking? Authorizing Provider  acetaminophen-codeine (TYLENOL #3) 300-30 MG tablet TK 1 T PO BID PRN 08/10/17   [provider]  albuterol (VENTOLIN HFA) 108 (90 Base) MCG/ACT inhaler Inhale 2 puffs into the  lungs every 6 (six) hours as needed for wheezing or shortness of breath.  09/13/15   [provider]  B Complex-C (B-COMPLEX WITH VITAMIN C) tablet Take 1 tablet by mouth daily.    [provider]  baclofen (LIORESAL) 10 MG tablet Take 1-2 tablets (10-20 mg total) by mouth 3 (three) times daily as needed for muscle spasms. Depends on pain level if takes 1-2 tablets 04/07/17   Teryl LucyLandau, Joshua, MD  Calcium Carb-Cholecalciferol (CALCIUM 600 + D PO) Take 1 tablet by mouth daily.    [provider]  clotrimazole (LOTRIMIN) 1 % cream Apply to affected area 2 times daily Patient not taking: Reported on 10/08/2017 07/02/17   Raeford RazorKohut, Stephen, MD  Diclofenac Sodium CR (VOLTAREN-XR) 100 MG 24 hr tablet Take 1 tablet (100 mg total) by mouth daily. Patient not taking: Reported on  10/08/2017 08/26/17   Palumbo, April, MD  lidocaine (LIDODERM) 5 % Place 1 patch onto the skin daily. Remove & Discard patch within 12 hours or as directed by MD Patient taking differently: Place 1-2 patches onto the skin daily. Remove & Discard patch within 12 hours or as directed by MD 07/22/16   Molpus, Jonny RuizJohn, MD  meloxicam (MOBIC) 15 MG tablet Take 15 mg by mouth daily.    [provider]  methocarbamol (ROBAXIN) 500 MG tablet Take 1 tablet (500 mg total) by mouth 2 (two) times daily. 08/26/17   Palumbo, April, MD  methylPREDNISolone (MEDROL DOSEPAK) 4 MG TBPK tablet Take as directed on packet 10/21/17   Itha Kroeker, Mayer Maskerourtney F, MD  ondansetron (ZOFRAN) 4 MG tablet Take 1 tablet (4 mg total) by mouth every 8 (eight) hours as needed for nausea or vomiting. Patient not taking: Reported on 10/08/2017 04/07/17   Teryl LucyLandau, Joshua, MD  oxyCODONE (ROXICODONE) 5 MG immediate release tablet Take 1-2 tablets (5-10 mg total) by mouth every 4 (four) hours as needed for severe pain. 04/07/17   Teryl LucyLandau, Joshua, MD  oxyCODONE-acetaminophen (PERCOCET/ROXICET) 5-325 MG tablet Take 1-2 tablets by mouth every 6 (six) hours as needed for severe pain. Patient not taking: Reported on 10/08/2017 08/29/17   Gwyneth SproutPlunkett, Whitney, MD  PHENobarbital (LUMINAL) 97.2 MG tablet Take 1 and 1 half tablets by mouth each evening 09/01/17   Van ClinesAquino, Karen M, MD  predniSONE (DELTASONE) 20 MG tablet Take 2 tablets (40 mg total) by mouth daily. Patient not taking: Reported on 10/08/2017 04/13/17   Gwyneth SproutPlunkett, Whitney, MD  sennosides-docusate sodium (SENOKOT-S) 8.6-50 MG tablet Take 2 tablets by mouth daily. Patient not taking: Reported on 10/08/2017 04/07/17   Teryl LucyLandau, Joshua, MD  TURMERIC CURCUMIN PO Take 1,200 mg by mouth daily. Take 2400mg  daily    [provider]    Family History Family History  Problem Relation Age of Onset  . Breast cancer Maternal Grandmother   . Alcohol abuse Father   . Lung disease Father   . Epilepsy Father      Social History Social History   Tobacco Use  . Smoking status: Heavy Tobacco Smoker    Packs/day: 0.50    Years: 2.00    Pack years: 1.00    Types: Cigarettes  . Smokeless tobacco: Never Used  Substance Use Topics  . Alcohol use: No  . Drug use: No     Allergies   Lortab [hydrocodone-acetaminophen]; Lamictal [lamotrigine]; and Gabapentin   Review of Systems Review of Systems  Constitutional: Negative for fever.  Genitourinary: Negative for difficulty urinating.  Musculoskeletal: Positive for back pain.  Right knee pain  Neurological: Negative for weakness and numbness.  All other systems reviewed and are negative.    Physical Exam Updated Vital Signs BP (!) 153/82 (BP Location: Right Arm)   Pulse 62   Temp 97.7 F (36.5 C) (Oral)   Resp (!) 22   Ht 5\' 4"  (1.626 m)   Wt (!) 163.3 kg (360 lb)   SpO2 98%   BMI 61.79 kg/m   Physical Exam  Constitutional: She is oriented to person, place, and time.  Morbidly obese, tearful  HENT:  Head: Normocephalic and atraumatic.  Neck: Neck supple.  Cardiovascular: Normal rate, regular rhythm and normal heart sounds.  Pulmonary/Chest: Effort normal and breath sounds normal. No respiratory distress. She has no wheezes.  Abdominal: Soft. There is no tenderness.  Musculoskeletal:  Tenderness to palpation bilateral paraspinous musculature of the lower lumbar spine, no midline tenderness, step-off, deformity, normal range of motion of the right knee, exam is limited by body habitus; however, no obvious joint laxity, no joint line tenderness, no overlying skin changes  Neurological: She is alert and oriented to person, place, and time.  5 out of 5 strength bilateral lower extremities with plantar and dorsiflexion, no clonus  Skin: Skin is warm and dry.  Psychiatric: She has a normal mood and affect.  Nursing note and vitals reviewed.    ED Treatments / Results  Labs (all labs ordered are listed, but only abnormal  results are displayed) Labs Reviewed - No data to display  EKG None  Radiology No results found.  Procedures Procedures (including critical care time)  Medications Ordered in ED Medications  ketorolac (TORADOL) 30 MG/ML injection 30 mg (30 mg Intramuscular Given 10/21/17 0137)  oxyCODONE-acetaminophen (PERCOCET/ROXICET) 5-325 MG per tablet 2 tablet (2 tablets Oral Given 10/21/17 0208)     Initial Impression / Assessment and Plan / ED Course  I have reviewed the triage vital signs and the nursing notes.  Pertinent labs & imaging results that were available during my care of the patient were reviewed by me and considered in my medical decision making (see chart for details).     Patient presents with acute on chronic back pain and right knee pain.  She has had similar symptoms in the past.  She is tearful but nontoxic-appearing and vital signs are reassuring.  Her exam is reassuring and she is nonfocal.  No signs or symptoms of cauda equina.  No other red flags.  At this time, I think imaging would be low yield.  Doubt fracture or other acute injury.  Patient was given Percocet and IM Toradol.  On recheck, she states that she feels somewhat better but "my pain is not gone."  I discussed with patient that realistically, I would not likely get her pain to 0 given her history.  She was able to ambulate independently without difficulty.  We will place her on a Medrol Dosepak and have her follow-up closely with her primary doctor.  I have encouraged her also to follow-up with pain management and consider weight loss options I do feel this would likely improve her symptoms dramatically.  Patient stated understanding.  After history, exam, and medical workup I feel the patient has been appropriately medically screened and is safe for discharge home. Pertinent diagnoses were discussed with the patient. Patient was given return precautions.  Final Clinical Impressions(s) / ED Diagnoses   Final  diagnoses:  Chronic bilateral low back pain without sciatica  Chronic pain of right knee  ED Discharge Orders        Ordered    methylPREDNISolone (MEDROL DOSEPAK) 4 MG TBPK tablet     10/21/17 0227       Shon Baton, MD 10/21/17 [phone]

## 2017-12-24 ENCOUNTER — Encounter (HOSPITAL_BASED_OUTPATIENT_CLINIC_OR_DEPARTMENT_OTHER): Payer: Self-pay | Admitting: Emergency Medicine

## 2017-12-24 ENCOUNTER — Emergency Department (HOSPITAL_BASED_OUTPATIENT_CLINIC_OR_DEPARTMENT_OTHER)
Admission: EM | Admit: 2017-12-24 | Discharge: 2017-12-24 | Disposition: A | Discharge status: Home / Self Care | Payer: Managed Care, Other (non HMO) | Attending: Emergency Medicine | Admitting: Emergency Medicine

## 2017-12-24 ENCOUNTER — Other Ambulatory Visit: Payer: Self-pay

## 2017-12-24 ENCOUNTER — Encounter (INDEPENDENT_AMBULATORY_CARE_PROVIDER_SITE_OTHER): Payer: Managed Care, Other (non HMO)

## 2017-12-24 DIAGNOSIS — M25512 Pain in left shoulder: Secondary | ICD-10-CM | POA: Diagnosis not present

## 2017-12-24 DIAGNOSIS — Z79899 Other long term (current) drug therapy: Secondary | ICD-10-CM | POA: Diagnosis not present

## 2017-12-24 DIAGNOSIS — F17210 Nicotine dependence, cigarettes, uncomplicated: Secondary | ICD-10-CM | POA: Insufficient documentation

## 2017-12-24 DIAGNOSIS — R69 Illness, unspecified: Secondary | ICD-10-CM | POA: Diagnosis not present

## 2017-12-24 DIAGNOSIS — J45909 Unspecified asthma, uncomplicated: Secondary | ICD-10-CM | POA: Insufficient documentation

## 2017-12-24 DIAGNOSIS — L02211 Cutaneous abscess of abdominal wall: Secondary | ICD-10-CM | POA: Diagnosis not present

## 2017-12-24 MED ORDER — OXYCODONE-ACETAMINOPHEN 5-325 MG PO TABS: 1.0000 | ORAL_TABLET | Freq: Four times a day (QID) | ORAL | 0 refills | Status: DC | PRN

## 2017-12-24 MED ORDER — LIDOCAINE-EPINEPHRINE (PF) 2 %-1:200000 IJ SOLN
INTRAMUSCULAR | Status: AC
  Administered 2017-12-24: 10 mL
  Filled 2017-12-24: qty 10

## 2017-12-24 MED ORDER — LIDOCAINE-EPINEPHRINE 2 %-1:100000 IJ SOLN
20.0000 mL | Freq: Once | INTRAMUSCULAR | Status: DC
  Filled 2017-12-24: qty 20

## 2017-12-24 NOTE — ED Triage Notes (Signed)
Pt c/o left shoulder pain. Pt is s/p rotator cuff surgery. Pt also has abscess to right lower abd.

## 2017-12-24 NOTE — ED Provider Notes (Signed)
[facility] Provider Note: Lowella DellJ. Lane Eural Holzschuh, MD, FACEP  CSN: [id number] MRN: [id number] ARRIVAL: 12/24/17 at 0142 ROOM: MH06/[facility]06   CHIEF COMPLAINT  Shoulder Pain and Abscess   HISTORY OF PRESENT ILLNESS  12/24/17 1:59 AM Misty Foster is a 47 y.o. female with a history of left rotator cuff repair last year.  She is here with a recurrence of pain in her left shoulder which she describes as moderate.  It is worse with movement of the left shoulder, primarily abduction.  The pain is located in the posterior shoulder and lateral to the left side of the neck.  She has not had a specific injury but has been using it excessively at work.  She has been taking ibuprofen 800 mg without adequate relief.  She is also here with several days of a painful, swollen, fluctuant area of her right lower abdominal pannus.  Pain is moderate and worse with palpation.  There has been no drainage from it.   Past Medical History:  Diagnosis Date  . Arthritis   . Asthma   . Back pain   . Chronic back pain   . Incomplete rotator cuff tear or rupture of left shoulder, not specified as traumatic 04/07/2017  . Seizures (HCC)   . Tuberous sclerosis [facility](HCC)     Past Surgical History:  Procedure Laterality Date  . ABLATION     cervical  . CESAREAN SECTION     x3  . SHOULDER ARTHROSCOPY WITH ROTATOR CUFF REPAIR Left 04/07/2017   Procedure: SHOULDER ARTHROSCOPY WITH ROTATOR CUFF REPAIR, ACROMIOPLASTY AND EXTENSIVE DEBRIDEMENT;  Surgeon: Teryl LucyLandau, Joshua, MD;  Location: [facility] OR;  Service: Orthopedics;  Laterality: Left;    Family History  Problem Relation Age of Onset  . Breast cancer Maternal Grandmother   . Alcohol abuse Father   . Lung disease Father   . Epilepsy Father     Social History   Tobacco Use  . Smoking status: Heavy Tobacco Smoker    Packs/day: 0.50    Years: 2.00    Pack years: 1.00    Types: Cigarettes  . Smokeless tobacco: Never Used  Substance Use Topics  .  Alcohol use: No  . Drug use: No    Prior to Admission medications   Medication Sig Start Date End Date Taking? Authorizing Provider  acetaminophen-codeine (TYLENOL #3) 300-30 MG tablet TK 1 T PO BID PRN 08/10/17   [provider]  albuterol (VENTOLIN HFA) 108 (90 Base) MCG/ACT inhaler Inhale 2 puffs into the lungs every 6 (six) hours as needed for wheezing or shortness of breath.  09/13/15   [provider]  B Complex-C (B-COMPLEX WITH VITAMIN C) tablet Take 1 tablet by mouth daily.    [provider]  baclofen (LIORESAL) 10 MG tablet Take 1-2 tablets (10-20 mg total) by mouth 3 (three) times daily as needed for muscle spasms. Depends on pain level if takes 1-2 tablets 04/07/17   Teryl LucyLandau, Joshua, MD  Calcium Carb-Cholecalciferol (CALCIUM 600 + D PO) Take 1 tablet by mouth daily.    [provider]  clotrimazole (LOTRIMIN) 1 % cream Apply to affected area 2 times daily Patient not taking: Reported on 10/08/2017 07/02/17   Raeford RazorKohut, Stephen, MD  Diclofenac Sodium CR (VOLTAREN-XR) 100 MG 24 hr tablet Take 1 tablet (100 mg total) by mouth daily. Patient not taking: Reported on 10/08/2017 08/26/17   Palumbo, April, MD  lidocaine (LIDODERM) 5 % Place 1 patch onto the skin daily. Remove &  Discard patch within 12 hours or as directed by MD Patient taking differently: Place 1-2 patches onto the skin daily. Remove & Discard patch within 12 hours or as directed by MD 07/22/16   Akhilesh Sassone, Jonny Ruiz, MD  meloxicam (MOBIC) 15 MG tablet Take 15 mg by mouth daily.    [provider]  methocarbamol (ROBAXIN) 500 MG tablet Take 1 tablet (500 mg total) by mouth 2 (two) times daily. 08/26/17   Palumbo, April, MD  methylPREDNISolone (MEDROL DOSEPAK) 4 MG TBPK tablet Take as directed on packet 10/21/17   Horton, Mayer Masker, MD  ondansetron (ZOFRAN) 4 MG tablet Take 1 tablet (4 mg total) by mouth every 8 (eight) hours as needed for nausea or vomiting. Patient not taking: Reported on 10/08/2017  04/07/17   Teryl Lucy, MD  oxyCODONE (ROXICODONE) 5 MG immediate release tablet Take 1-2 tablets (5-10 mg total) by mouth every 4 (four) hours as needed for severe pain. 04/07/17   Teryl Lucy, MD  oxyCODONE-acetaminophen (PERCOCET/ROXICET) 5-325 MG tablet Take 1-2 tablets by mouth every 6 (six) hours as needed for severe pain. Patient not taking: Reported on 10/08/2017 08/29/17   Gwyneth Sprout, MD  PHENobarbital (LUMINAL) 97.2 MG tablet Take 1 and 1 half tablets by mouth each evening 09/01/17   Van Clines, MD  predniSONE (DELTASONE) 20 MG tablet Take 2 tablets (40 mg total) by mouth daily. Patient not taking: Reported on 10/08/2017 04/13/17   Gwyneth Sprout, MD  sennosides-docusate sodium (SENOKOT-S) 8.6-50 MG tablet Take 2 tablets by mouth daily. Patient not taking: Reported on 10/08/2017 04/07/17   Teryl Lucy, MD  TURMERIC CURCUMIN PO Take 1,200 mg by mouth daily. Take 2400mg  daily    [provider]    Allergies Lortab [hydrocodone-acetaminophen]; Lamictal [lamotrigine]; and Gabapentin   REVIEW OF SYSTEMS  Negative except as noted here or in the History of Present Illness.   PHYSICAL EXAMINATION  Initial Vital Signs Blood pressure (!) 150/85, pulse 82, temperature 98.2 F (36.8 C), temperature source Oral, resp. rate 20, height 5\' 4"  (1.626 m), weight (!) 160.2 kg, SpO2 100 %.  Examination General: Well-developed, obese female in no acute distress; appearance consistent with age of record HENT: normocephalic; atraumatic Eyes: pupils equal, round and reactive to light; extraocular muscles intact Neck: supple Heart: regular rate and rhythm Lungs: clear to auscultation bilaterally Abdomen: soft; nondistended; nontender; bowel sounds present Extremities: No deformity; full range of motion; pulses normal; pain on movement of left shoulder primarily abduction and rotation Neurologic: Awake, alert and oriented; motor function intact in all extremities and  symmetric; no facial droop Skin: Warm and dry; tender, pointing lesion right lower abdominal pannus consistent with an abscess Psychiatric: Normal mood and affect   RESULTS  Summary of this visit's results, reviewed by myself:   EKG Interpretation  Date/Time:    Ventricular Rate:    PR Interval:    QRS Duration:   QT Interval:    QTC Calculation:   R Axis:     Text Interpretation:        Laboratory Studies: No results found for this or any previous visit (from the past 24 hour(s)). Imaging Studies: No results found.  ED COURSE and MDM  Nursing notes and initial vitals signs, including pulse oximetry, reviewed.  Vitals:   12/24/17 0150 12/24/17 0153  BP: (!) 150/85   Pulse: 82   Resp: 20   Temp: 98.2 F (36.8 C)   TempSrc: Oral   SpO2: 100%   Weight:  Marland Kitchen)  160.2 kg  Height:  5\' 4"  (1.626 m)   Patient was advised to remove her packing in 2 to 3 days if symptoms are improving.  She was advised to return if symptoms are worsening.  Her left shoulder pain is consistent with recurrent rotator cuff syndrome.  She has an establishing appointment with pain management next month and currently has no narcotic pain medication.  Consultation with the [facility] state controlled substances database reveals the patient has received as received multiple prescriptions for Tylenol 3 and oxycodone in the past 2 years, most recently June 6 of this year.  All of these prescriptions have been from her primary care office.Marland Kitchen   PROCEDURES   INCISION AND DRAINAGE Performed by: Paula Libra L Consent: Verbal consent obtained. Risks and benefits: risks, benefits and alternatives were discussed Type: abscess  Body area: Right abdominal pannus  Anesthesia: local infiltration  Incision was made with a scalpel.  Local anesthetic: lidocaine 2 % with epinephrine  Anesthetic total: 3 ml  Complexity: complex Blunt dissection to break up loculations  Drainage: purulent  Drainage  amount: Small  Packing material: 1/4 in iodoform gauze  Patient tolerance: Patient tolerated the procedure well with no immediate complications.   ED DIAGNOSES     ICD-10-CM   1. Abscess of skin of abdomen L02.211   2. Acute pain of left shoulder M25.512        Abran Gavigan, Jonny Ruiz, MD 12/24/17 [phone]

## 2017-12-24 NOTE — ED Notes (Signed)
Pt verbalizes understanding of d/c instructions and denies any further needs at this time. 

## 2018-01-01 DIAGNOSIS — L02211 Cutaneous abscess of abdominal wall: Secondary | ICD-10-CM | POA: Diagnosis not present

## 2018-01-07 ENCOUNTER — Ambulatory Visit (INDEPENDENT_AMBULATORY_CARE_PROVIDER_SITE_OTHER): Payer: Managed Care, Other (non HMO) | Admitting: Family Medicine

## 2018-01-13 ENCOUNTER — Emergency Department (HOSPITAL_BASED_OUTPATIENT_CLINIC_OR_DEPARTMENT_OTHER)
Admission: EM | Admit: 2018-01-13 | Discharge: 2018-01-13 | Disposition: A | Discharge status: Home / Self Care | Payer: Managed Care, Other (non HMO) | Attending: Emergency Medicine | Admitting: Emergency Medicine

## 2018-01-13 ENCOUNTER — Encounter (HOSPITAL_BASED_OUTPATIENT_CLINIC_OR_DEPARTMENT_OTHER): Payer: Self-pay | Admitting: Emergency Medicine

## 2018-01-13 ENCOUNTER — Other Ambulatory Visit: Payer: Self-pay

## 2018-01-13 DIAGNOSIS — F17210 Nicotine dependence, cigarettes, uncomplicated: Secondary | ICD-10-CM | POA: Diagnosis not present

## 2018-01-13 DIAGNOSIS — B372 Candidiasis of skin and nail: Secondary | ICD-10-CM | POA: Diagnosis not present

## 2018-01-13 DIAGNOSIS — B379 Candidiasis, unspecified: Secondary | ICD-10-CM | POA: Diagnosis not present

## 2018-01-13 DIAGNOSIS — L309 Dermatitis, unspecified: Secondary | ICD-10-CM | POA: Diagnosis not present

## 2018-01-13 DIAGNOSIS — J45909 Unspecified asthma, uncomplicated: Secondary | ICD-10-CM | POA: Insufficient documentation

## 2018-01-13 DIAGNOSIS — R21 Rash and other nonspecific skin eruption: Secondary | ICD-10-CM | POA: Diagnosis present

## 2018-01-13 DIAGNOSIS — R69 Illness, unspecified: Secondary | ICD-10-CM | POA: Diagnosis not present

## 2018-01-13 DIAGNOSIS — Z79899 Other long term (current) drug therapy: Secondary | ICD-10-CM | POA: Insufficient documentation

## 2018-01-13 MED ORDER — NYSTATIN-TRIAMCINOLONE 100000-0.1 UNIT/GM-% EX CREA: TOPICAL_CREAM | CUTANEOUS | 0 refills | Status: DC

## 2018-01-13 NOTE — ED Provider Notes (Signed)
TIME SEEN: 5:52 AM  CHIEF COMPLAINT: rash  HPI: Patient is a 47 year old female with history of seizures who presents to the emergency department with rash to the right side of her pannus.  She was seen here on August 29 for an abscess to this area that was incised and drained.  She states the packing has been removed and her abscess has improved but now she has a rash around the abscess site that is pruritic.  No drainage.  No abdominal pain.  No fever.  ROS: See HPI Constitutional: no fever  Eyes: no drainage  ENT: no runny nose   Cardiovascular:  no chest pain  Resp: no SOB  GI: no vomiting GU: no dysuria Integumentary: no rash  Allergy: no hives  Musculoskeletal: no leg swelling  Neurological: no slurred speech ROS otherwise negative  PAST MEDICAL HISTORY/PAST SURGICAL HISTORY:  Past Medical History:  Diagnosis Date  . Arthritis   . Asthma   . Back pain   . Chronic back pain   . Incomplete rotator cuff tear or rupture of left shoulder, not specified as traumatic 04/07/2017  . Seizures (HCC)   . Tuberous sclerosis (HCC)     MEDICATIONS:  Prior to Admission medications   Medication Sig Start Date End Date Taking? Authorizing Provider  albuterol (VENTOLIN HFA) 108 (90 Base) MCG/ACT inhaler Inhale 2 puffs into the lungs every 6 (six) hours as needed for wheezing or shortness of breath.  09/13/15   [provider]  B Complex-C (B-COMPLEX WITH VITAMIN C) tablet Take 1 tablet by mouth daily.    [provider]  baclofen (LIORESAL) 10 MG tablet Take 1-2 tablets (10-20 mg total) by mouth 3 (three) times daily as needed for muscle spasms. Depends on pain level if takes 1-2 tablets 04/07/17   Teryl Lucy, MD  Calcium Carb-Cholecalciferol (CALCIUM 600 + D PO) Take 1 tablet by mouth daily.    [provider]  lidocaine (LIDODERM) 5 % Place 1 patch onto the skin daily. Remove & Discard patch within 12 hours or as directed by MD Patient taking differently:  Place 1-2 patches onto the skin daily. Remove & Discard patch within 12 hours or as directed by MD 07/22/16   Molpus, Jonny Ruiz, MD  meloxicam (MOBIC) 15 MG tablet Take 15 mg by mouth daily.    [provider]  methocarbamol (ROBAXIN) 500 MG tablet Take 1 tablet (500 mg total) by mouth 2 (two) times daily. 08/26/17   Palumbo, April, MD  oxyCODONE-acetaminophen (PERCOCET) 5-325 MG tablet Take 1 tablet by mouth every 6 (six) hours as needed (for pain). 12/24/17   Molpus, John, MD  PHENobarbital (LUMINAL) 97.2 MG tablet Take 1 and 1 half tablets by mouth each evening 09/01/17   Van Clines, MD  TURMERIC CURCUMIN PO Take 1,200 mg by mouth daily. Take 2400mg  daily    [provider]    ALLERGIES:  Allergies  Allergen Reactions  . Lortab [Hydrocodone-Acetaminophen] Other (See Comments)    Auras  . Lamictal [Lamotrigine] Nausea And Vomiting and Rash  . Gabapentin Other (See Comments)    auras    SOCIAL HISTORY:  Social History   Tobacco Use  . Smoking status: Heavy Tobacco Smoker    Packs/day: 0.50    Years: 2.00    Pack years: 1.00    Types: Cigarettes  . Smokeless tobacco: Never Used  Substance Use Topics  . Alcohol use: No    FAMILY HISTORY: Family History  Problem Relation Age  of Onset  . Breast cancer Maternal Grandmother   . Alcohol abuse Father   . Lung disease Father   . Epilepsy Father     EXAM: BP 125/76 (BP Location: Right Arm)   Pulse 66   Temp 97.8 F (36.6 C) (Oral)   Resp 18   SpO2 99%  CONSTITUTIONAL: Alert and oriented and responds appropriately to questions. Well-appearing; well-nourished HEAD: Normocephalic EYES: Conjunctivae clear, pupils appear equal, EOMI ENT: normal nose; moist mucous membranes NECK: Supple, no meningismus, no nuchal rigidity, no LAD  CARD: RRR; S1 and S2 appreciated; no murmurs, no clicks, no rubs, no gallops RESP: Normal chest excursion without splinting or tachypnea; breath sounds clear and equal bilaterally; no  wheezes, no rhonchi, no rales, no hypoxia or respiratory distress, speaking full sentences ABD/GI: Normal bowel sounds; non-distended; soft, non-tender, no rebound, no guarding, no peritoneal signs, no hepatosplenomegaly BACK:  The back appears normal and is non-tender to palpation, there is no CVA tenderness EXT: Normal ROM in all joints; non-tender to palpation; no edema; normal capillary refill; no cyanosis, no calf tenderness or swelling    SKIN: Normal color for age and race; warm; no abscess or cellulitis.  Patient has erythematous maculopapular lesions noted to the fold underneath her pannus on the right side with satellite lesions consistent with candidiasis but no petechiae or purpura, no hives, no vesicles, no blisters or desquamation NEURO: Moves all extremities equally PSYCH: The patient's mood and manner are appropriate. Grooming and personal hygiene are appropriate.  MEDICAL DECISION MAKING: Patient here with intertrigo, candidiasis.  No abscess or cellulitis.  No systemic symptoms.  Patient unable to take antihistamines as she states that they cause her to have seizures.  Will discharge with prescription of nystatin and triamcinolone.  Discussed that she keep this area clean and dry.  I do not feel she needs antibiotics at this time as there is no sign of superimposed bacterial infection.  At this time, I do not feel there is any life-threatening condition present. I have reviewed and discussed all results (EKG, imaging, lab, urine as appropriate) and exam findings with patient/family. I have reviewed nursing notes and appropriate previous records.  I feel the patient is safe to be discharged home without further emergent workup and can continue workup as an outpatient as needed. Discussed usual and customary return precautions. Patient/family verbalize understanding and are comfortable with this plan.  Outpatient follow-up has been provided if needed. All questions have been answered.      Donnis Phaneuf, Layla MawKristen N, DO 01/13/18 [phone]

## 2018-01-13 NOTE — ED Triage Notes (Signed)
Pt has rash to right lower abd. Pt states she is on abx from her PMD for rash without improvement.

## 2018-01-22 ENCOUNTER — Emergency Department (HOSPITAL_BASED_OUTPATIENT_CLINIC_OR_DEPARTMENT_OTHER)
Admission: EM | Admit: 2018-01-22 | Discharge: 2018-01-22 | Disposition: A | Discharge status: Home / Self Care | Payer: Managed Care, Other (non HMO) | Attending: Emergency Medicine | Admitting: Emergency Medicine

## 2018-01-22 ENCOUNTER — Encounter (HOSPITAL_BASED_OUTPATIENT_CLINIC_OR_DEPARTMENT_OTHER): Payer: Self-pay | Admitting: Emergency Medicine

## 2018-01-22 ENCOUNTER — Other Ambulatory Visit: Payer: Self-pay

## 2018-01-22 DIAGNOSIS — Z79899 Other long term (current) drug therapy: Secondary | ICD-10-CM | POA: Insufficient documentation

## 2018-01-22 DIAGNOSIS — J45909 Unspecified asthma, uncomplicated: Secondary | ICD-10-CM | POA: Insufficient documentation

## 2018-01-22 DIAGNOSIS — R69 Illness, unspecified: Secondary | ICD-10-CM | POA: Diagnosis not present

## 2018-01-22 DIAGNOSIS — M79604 Pain in right leg: Secondary | ICD-10-CM | POA: Insufficient documentation

## 2018-01-22 DIAGNOSIS — F17210 Nicotine dependence, cigarettes, uncomplicated: Secondary | ICD-10-CM | POA: Diagnosis not present

## 2018-01-22 DIAGNOSIS — M5417 Radiculopathy, lumbosacral region: Secondary | ICD-10-CM | POA: Insufficient documentation

## 2018-01-22 DIAGNOSIS — M545 Low back pain: Secondary | ICD-10-CM | POA: Diagnosis present

## 2018-01-22 MED ORDER — METHYLPREDNISOLONE 4 MG PO TBPK: ORAL_TABLET | ORAL | 0 refills | Status: DC

## 2018-01-22 MED ORDER — DEXAMETHASONE SODIUM PHOSPHATE 10 MG/ML IJ SOLN
10.0000 mg | Freq: Once | INTRAMUSCULAR | Status: AC
  Administered 2018-01-22: 10 mg via INTRAMUSCULAR
  Filled 2018-01-22: qty 1

## 2018-01-22 NOTE — Discharge Instructions (Addendum)

## 2018-01-22 NOTE — ED Triage Notes (Signed)
Pt c/o back pain that isn't controlled by her home medications. Pt states she has pain management oct 9

## 2018-01-22 NOTE — ED Provider Notes (Signed)
[facility] EMERGENCY DEPARTMENT Provider Note   CSN: [id number] Arrival date & time: 01/22/18  4098     History   Chief Complaint Chief Complaint  Patient presents with  . Back Pain    HPI Misty Foster is a 47 y.o. female.  The history is provided by the patient.  Back Pain   This is a recurrent problem. The current episode started more than 2 days ago. The problem occurs daily. The problem has been gradually worsening. The pain is associated with no known injury. The pain is present in the lumbar spine. The quality of the pain is described as shooting. The pain radiates to the right thigh. The pain is severe. The symptoms are aggravated by certain positions. Pertinent negatives include no fever, no abdominal pain, no bowel incontinence, no bladder incontinence, no dysuria and no weakness. She has tried muscle relaxants and analgesics for the symptoms. The treatment provided no relief. Risk factors include obesity.  Patient with history of obesity, chronic back pain, seizures presents with worsening back pain.  She reports over the past several days she been having low back pain that radiates into her right leg.  This appears to be an acute on chronic episode.  No falls.  No weakness.  This is very similar to prior episodes.  She is scheduled to see pain management next month She has been using oxycodone and baclofen at home without any improvement.  She reports a course of steroids usually improves her pain  Past Medical History:  Diagnosis Date  . Arthritis   . Asthma   . Back pain   . Chronic back pain   . Incomplete rotator cuff tear or rupture of left shoulder, not specified as traumatic 04/07/2017  . Seizures (HCC)   . Tuberous sclerosis [facility])     Patient Active Problem List   Diagnosis Date Noted  . Incomplete rotator cuff tear or rupture of left shoulder, not specified as traumatic 04/07/2017  . Left rotator cuff tear 04/07/2017  . Left shoulder  pain 01/01/2017  . Pain in right hip 11/10/2016  . Chronic right-sided low back pain without sciatica 11/10/2016  . 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 Neuropathy 06/20/2016  . Chronic back pain 05/01/2016  . Migraine without aura and without status migrainosus, not intractable 02/11/2015  . 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 ABLATION     cervical  . CESAREAN SECTION     x3  . SHOULDER ARTHROSCOPY WITH ROTATOR CUFF REPAIR Left 04/07/2017   Procedure: SHOULDER ARTHROSCOPY WITH ROTATOR CUFF REPAIR, ACROMIOPLASTY AND EXTENSIVE DEBRIDEMENT;  Surgeon: Teryl Lucy, MD;  Location: [facility] OR;  Service: Orthopedics;  Laterality: Left;     OB History    Gravida  4   Para  3   Term      Preterm      AB  1   Living        SAB      TAB      Ectopic      Multiple      Live Births               Home Medications    Prior to Admission medications   Medication Sig Start Date End Date Taking? Authorizing Provider  albuterol (VENTOLIN HFA) 108 (90 Base) MCG/ACT inhaler Inhale 2 puffs  into the lungs every 6 (six) hours as needed for wheezing or shortness of breath.  09/13/15   [provider]  B Complex-C (B-COMPLEX WITH VITAMIN C) tablet Take 1 tablet by mouth daily.    [provider]  baclofen (LIORESAL) 10 MG tablet Take 1-2 tablets (10-20 mg total) by mouth 3 (three) times daily as needed for muscle spasms. Depends on pain level if takes 1-2 tablets 04/07/17   Teryl Lucy, MD  Calcium Carb-Cholecalciferol (CALCIUM 600 + D PO) Take 1 tablet by mouth daily.    [provider]  lidocaine (LIDODERM) 5 % Place 1 patch onto the skin daily. Remove & Discard patch within 12 hours or as directed by MD Patient taking differently: Place 1-2 patches onto the skin daily.  Remove & Discard patch within 12 hours or as directed by MD 07/22/16   Molpus, Jonny Ruiz, MD  meloxicam (MOBIC) 15 MG tablet Take 15 mg by mouth daily.    [provider]  methylPREDNISolone (MEDROL DOSEPAK) 4 MG TBPK tablet Take as directed on dose pack 01/22/18   Zadie Rhine, MD  nystatin-triamcinolone [facility]) cream Apply to affected area twice daily 01/13/18   Ward, Layla Maw, DO  PHENobarbital (LUMINAL) 97.2 MG tablet Take 1 and 1 half tablets by mouth each evening 09/01/17   Van Clines, MD  TURMERIC CURCUMIN PO Take 1,200 mg by mouth daily. Take 2400mg  daily    [provider]    Family History Family History  Problem Relation Age of Onset  . Breast cancer Maternal Grandmother   . Alcohol abuse Father   . Lung disease Father   . Epilepsy Father     Social History Social History   Tobacco Use  . Smoking status: Heavy Tobacco Smoker    Packs/day: 0.50    Years: 2.00    Pack years: 1.00    Types: Cigarettes  . Smokeless tobacco: Never Used  Substance Use Topics  . Alcohol use: No  . Drug use: No     Allergies   Lortab [hydrocodone-acetaminophen]; Lamictal [lamotrigine]; and Gabapentin   Review of Systems Review of Systems  Constitutional: Negative for fever.  Gastrointestinal: Negative for abdominal pain and bowel incontinence.  Genitourinary: Negative for bladder incontinence and dysuria.  Musculoskeletal: Positive for back pain.  Neurological: Negative for weakness.  All other systems reviewed and are negative.    Physical Exam Updated Vital Signs BP (!) 143/103 (BP Location: Right Wrist)   Pulse 77   Temp 97.8 F (36.6 C) (Oral)   Resp 18   Ht 1.626 m (5\' 4" )   Wt (!) 160.1 kg   SpO2 98%   BMI 60.59 kg/m   Physical Exam  CONSTITUTIONAL: Well developed/well nourished HEAD: Normocephalic/atraumatic EYES: EOMI/PERRL ENMT: Mucous membranes moist NECK: supple no meningeal signs SPINE/BACK: Lumbar spinal tenderness, lumbar  paraspinal tenderness. No bruising/crepitance/stepoffs noted to spine CV: S1/S2 noted, no murmurs/rubs/gallops noted LUNGS: Lungs are clear to auscultation bilaterally, no apparent distress ABDOMEN: soft, nontender, obese GU:no cva tenderness NEURO: Awake/alert,equal motor 5/5 strength noted with the following: hip flexion/knee flexion/extension, foot dorsi/plantar flexion, great toe extension intact bilaterally, no clonus bilaterally, no sensory deficit in any dermatome.   Pt is able to ambulate unassisted. EXTREMITIES: pulses normal, full ROM SKIN: warm, color normal PSYCH: no abnormalities of mood noted, alert and oriented to situation   ED Treatments / Results  Labs (all labs ordered are listed, but only abnormal results are displayed) Labs Reviewed - No data  to display  EKG None  Radiology No results found.  Procedures Procedures  Narcotic database reviewed and considered in decision making Medications Ordered in ED Medications  dexamethasone (DECADRON) injection 10 mg (10 mg Intramuscular Given 01/22/18 0615)     Initial Impression / Assessment and Plan / ED Course  I have reviewed the triage vital signs and the nursing notes.      Patient with acute on chronic back pain.  She reports it is not improved with oxycodone and baclofen at home.  She has no focal neuro deficits.  No other acute emergent process noted.  She is requesting steroids, she reports Decadron usually helps her pain.  She also reports a Medrol Dosepak is also helpful.  Final Clinical Impressions(s) / ED Diagnoses   Final diagnoses:  Lumbosacral radiculopathy    ED Discharge Orders         Ordered    methylPREDNISolone (MEDROL DOSEPAK) 4 MG TBPK tablet     01/22/18 4098           Zadie Rhine, MD 01/22/18 [phone]

## 2018-01-22 NOTE — ED Notes (Signed)
Warm blankets given.

## 2018-01-29 DIAGNOSIS — M25512 Pain in left shoulder: Secondary | ICD-10-CM | POA: Diagnosis not present

## 2018-02-09 ENCOUNTER — Encounter (HOSPITAL_BASED_OUTPATIENT_CLINIC_OR_DEPARTMENT_OTHER): Payer: Self-pay | Admitting: Emergency Medicine

## 2018-02-09 ENCOUNTER — Emergency Department (HOSPITAL_COMMUNITY): Payer: 59

## 2018-02-09 ENCOUNTER — Other Ambulatory Visit: Payer: Self-pay

## 2018-02-09 ENCOUNTER — Emergency Department (HOSPITAL_BASED_OUTPATIENT_CLINIC_OR_DEPARTMENT_OTHER)
Admission: EM | Admit: 2018-02-09 | Discharge: 2018-02-09 | Disposition: A | Discharge status: Home / Self Care | Payer: 59 | Attending: Emergency Medicine | Admitting: Emergency Medicine

## 2018-02-09 DIAGNOSIS — M5441 Lumbago with sciatica, right side: Secondary | ICD-10-CM | POA: Diagnosis not present

## 2018-02-09 DIAGNOSIS — Z79899 Other long term (current) drug therapy: Secondary | ICD-10-CM | POA: Diagnosis not present

## 2018-02-09 DIAGNOSIS — N3941 Urge incontinence: Secondary | ICD-10-CM | POA: Diagnosis not present

## 2018-02-09 DIAGNOSIS — F17210 Nicotine dependence, cigarettes, uncomplicated: Secondary | ICD-10-CM | POA: Insufficient documentation

## 2018-02-09 DIAGNOSIS — M545 Low back pain: Secondary | ICD-10-CM | POA: Diagnosis not present

## 2018-02-09 DIAGNOSIS — Z6841 Body Mass Index (BMI) 40.0 and over, adult: Secondary | ICD-10-CM | POA: Diagnosis not present

## 2018-02-09 DIAGNOSIS — J45909 Unspecified asthma, uncomplicated: Secondary | ICD-10-CM | POA: Diagnosis not present

## 2018-02-09 DIAGNOSIS — R69 Illness, unspecified: Secondary | ICD-10-CM | POA: Diagnosis not present

## 2018-02-09 LAB — URINALYSIS, ROUTINE W REFLEX MICROSCOPIC
BILIRUBIN URINE: NEGATIVE
GLUCOSE, UA: NEGATIVE mg/dL
Hgb urine dipstick: NEGATIVE
KETONES UR: NEGATIVE mg/dL
Leukocytes, UA: NEGATIVE
Nitrite: NEGATIVE
PH: 6 (ref 5.0–8.0)
Protein, ur: NEGATIVE mg/dL
Specific Gravity, Urine: 1.015 (ref 1.005–1.030)

## 2018-02-09 MED ORDER — DEXAMETHASONE SODIUM PHOSPHATE 10 MG/ML IJ SOLN
10.0000 mg | Freq: Once | INTRAMUSCULAR | Status: AC
  Administered 2018-02-09: 10 mg via INTRAMUSCULAR
  Filled 2018-02-09: qty 1

## 2018-02-09 MED ORDER — MORPHINE SULFATE (PF) 4 MG/ML IV SOLN
4.0000 mg | Freq: Once | INTRAVENOUS | Status: AC
  Administered 2018-02-09: 4 mg via INTRAMUSCULAR
  Filled 2018-02-09: qty 1

## 2018-02-09 NOTE — ED Notes (Signed)
Took patient to bathroom via wheelchair. Ambulated well.

## 2018-02-09 NOTE — ED Notes (Signed)
Pt sent to [facility] ED POV for MRI. Pt accepted by Dr. Wilkie Aye.

## 2018-02-09 NOTE — ED Triage Notes (Signed)
Pt has been seen for lumbar pain without improvement in pain and has been having urinary incontinence x 1 week.

## 2018-02-09 NOTE — ED Provider Notes (Signed)
[facility] EMERGENCY DEPARTMENT Provider Note   CSN: [id number] Arrival date & time: 02/09/18  0508     History   Chief Complaint Chief Complaint  Patient presents with  . Back Pain    HPI Misty Foster is a 47 y.o. female.  The history is provided by the patient.  She has a history of chronic back pain and comes in because of ongoing severe right-sided lower back pain radiating into the right hip and leg.  She was seen in the emergency department here on September 27 and discharged with a prescription for a Medrol Dosepak, but pain has not improved at all.  She is also taking meloxicam and baclofen.  She has also been taking oxycodone-acetaminophen for pain without relief.  Pain is rated at 8/10.  Over the last week, she has noted urinary incontinence.  Urinary incontinence is related to a sense that she has to urinate, but she is unable to get to the bathroom in time.  She has not had any episodes of incontinence without being aware of the need to urinate.  She denies any fecal incontinence.  She denies any numbness.  She has noted that it hurts to move her right leg, and this has been a problem when driving.  But she is not actually noticed any true weakness, just pain with movement.  She rates pain at 8/10.  She also relates that she had been referred to a pain management clinic but was dropped when she canceled appointments on 2 occasions, so she does not have a pain management physician.  She does express concern that her pain may be related to things other than arthritis and herniated disks and specifically asked me about her sacroiliac joints and ankylosing spondylitis.  Past Medical History:  Diagnosis Date  . Arthritis   . Asthma   . Back pain   . Chronic back pain   . Incomplete rotator cuff tear or rupture of left shoulder, not specified as traumatic 04/07/2017  . Seizures (HCC)   . Tuberous sclerosis [facility])     Patient Active Problem List   Diagnosis  Date Noted  . Incomplete rotator cuff tear or rupture of left shoulder, not specified as traumatic 04/07/2017  . Left rotator cuff tear 04/07/2017  . Left shoulder pain 01/01/2017  . Pain in right hip 11/10/2016  . Chronic right-sided low back pain without sciatica 11/10/2016  . 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 Neuropathy 06/20/2016  . Chronic back pain 05/01/2016  . Migraine without aura and without status migrainosus, not intractable 02/11/2015  . 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 ABLATION     cervical  . CESAREAN SECTION     x3  . SHOULDER ARTHROSCOPY WITH ROTATOR CUFF REPAIR Left 04/07/2017   Procedure: SHOULDER ARTHROSCOPY WITH ROTATOR CUFF REPAIR, ACROMIOPLASTY AND EXTENSIVE DEBRIDEMENT;  Surgeon: Teryl Lucy, MD;  Location: [facility] OR;  Service: Orthopedics;  Laterality: Left;     OB History    Gravida  4   Para  3   Term      Preterm      AB  1   Living        SAB      TAB      Ectopic      Multiple      Live Births  Home Medications    Prior to Admission medications   Medication Sig Start Date End Date Taking? Authorizing Provider  albuterol (VENTOLIN HFA) 108 (90 Base) MCG/ACT inhaler Inhale 2 puffs into the lungs every 6 (six) hours as needed for wheezing or shortness of breath.  09/13/15   [provider]  B Complex-C (B-COMPLEX WITH VITAMIN C) tablet Take 1 tablet by mouth daily.    [provider]  baclofen (LIORESAL) 10 MG tablet Take 1-2 tablets (10-20 mg total) by mouth 3 (three) times daily as needed for muscle spasms. Depends on pain level if takes 1-2 tablets 04/07/17   Teryl Lucy, MD  Calcium Carb-Cholecalciferol (CALCIUM 600 + D PO) Take 1 tablet by mouth daily.    [provider]  lidocaine (LIDODERM) 5 %  Place 1 patch onto the skin daily. Remove & Discard patch within 12 hours or as directed by MD Patient taking differently: Place 1-2 patches onto the skin daily. Remove & Discard patch within 12 hours or as directed by MD 07/22/16   Molpus, Jonny Ruiz, MD  meloxicam (MOBIC) 15 MG tablet Take 15 mg by mouth daily.    [provider]  methylPREDNISolone (MEDROL DOSEPAK) 4 MG TBPK tablet Take as directed on dose pack 01/22/18   Zadie Rhine, MD  nystatin-triamcinolone [facility]) cream Apply to affected area twice daily 01/13/18   Ward, Layla Maw, DO  PHENobarbital (LUMINAL) 97.2 MG tablet Take 1 and 1 half tablets by mouth each evening 09/01/17   Van Clines, MD  TURMERIC CURCUMIN PO Take 1,200 mg by mouth daily. Take 2400mg  daily    [provider]    Family History Family History  Problem Relation Age of Onset  . Breast cancer Maternal Grandmother   . Alcohol abuse Father   . Lung disease Father   . Epilepsy Father     Social History Social History   Tobacco Use  . Smoking status: Heavy Tobacco Smoker    Packs/day: 0.50    Years: 2.00    Pack years: 1.00    Types: Cigarettes  . Smokeless tobacco: Never Used  Substance Use Topics  . Alcohol use: No  . Drug use: No     Allergies   Lortab [hydrocodone-acetaminophen]; Lamictal [lamotrigine]; and Gabapentin   Review of Systems Review of Systems  All other systems reviewed and are negative.    Physical Exam Updated Vital Signs BP (!) 145/109 (BP Location: Right Arm)   Pulse 67   Temp 97.6 F (36.4 C) (Oral)   Resp 18   Ht 5\' 4"  (1.626 m)   Wt (!) 160.1 kg   SpO2 100%   BMI 60.59 kg/m   Physical Exam  Nursing note and vitals reviewed.  Morbidly obese 47 year old female, appears uncomfortable, but is in no acute distress. Vital signs are significant for elevated blood pressure. Oxygen saturation is 100%, which is normal. Head is normocephalic and atraumatic. PERRLA, EOMI. Oropharynx is clear. Neck  is nontender and supple without adenopathy or JVD. Back is nontender and there is no CVA tenderness.  Straight leg raise is positive on the right at 15 degrees, crossed straight leg raise is positive on the left at 30 degrees. Lungs are clear without rales, wheezes, or rhonchi. Chest is nontender. Heart has regular rate and rhythm without murmur. Abdomen is soft, flat, nontender without masses or hepatosplenomegaly and peristalsis is normoactive. Extremities have 1+ edema, full range of motion is present. Skin is warm and dry  without rash. Neurologic: Mental status is normal, cranial nerves are intact, there are no motor or sensory deficits.  Careful motor and sensory exam of the legs shows no sensory loss and normal strength of all muscles.  ED Treatments / Results  Labs (all labs ordered are listed, but only abnormal results are displayed) Labs Reviewed  URINALYSIS, ROUTINE W REFLEX MICROSCOPIC - Abnormal; Notable for the following components:      Result Value   APPearance HAZY (*)    All other components within normal limits   Procedures Procedures  Medications Ordered in ED Medications - No data to display   Initial Impression / Assessment and Plan / ED Course  I have reviewed the triage vital signs and the nursing notes.  Exacerbation of chronic back pain with right-sided sciatica.  Old records reviewed confirming ED visit on September 27.  She also has had an MRI scan of her lumbar spine on Sep 15, 2016 which showed moderate facet arthritis and small disc protrusion at L4-5 with mild right lateral recess stenosis.  She had a CT scan of abdomen and pelvis on Aug 29, 2017 and, on review of those images, had normal sacroiliac joints, and lumbar spine showed mild degenerative changes. Urinalysis was checked to look for occult urinary tract infection, but was negative.  She needs to have an emergent MRI scan of lumbar spine to rule out cauda equina syndrome given her incontinence.  MRI  scanner is not available here today, so she is transferred to [facility] emergency department for MRI scan.  Case is discussed with Dr. Wilkie Aye at [facility] emergency department who agrees to accept the patient.  Final Clinical Impressions(s) / ED Diagnoses   Final diagnoses:  Right-sided low back pain with right-sided sciatica, unspecified chronicity  Morbid obesity (HCC)  Urge urinary incontinence    ED Discharge Orders    None       Dione Booze, MD 02/09/18 [phone]

## 2018-02-09 NOTE — ED Provider Notes (Signed)
6:46 AM  Patient transferred from [facility] for MRI of the lower lumbar spine.  Reports increasing pain and sciatica symptoms.  Also reports urinary incontinence.  Was not given anything for pain prior to transfer as patient brought herself.  She reports reports her pain is 10 out of 10.  Neuro exam is largely intact.  MRI pending.  Patient was given IM Decadron and IM morphine.   Shon Baton, MD 02/09/18 [phone]

## 2018-02-09 NOTE — ED Notes (Signed)
Pt drove self to ED after working tonight.

## 2018-02-09 NOTE — ED Provider Notes (Signed)
  Physical Exam  BP 135/72 (BP Location: Right Arm)   Pulse (!) 58   Temp 98.7 F (37.1 C) (Oral)   Resp 17   Ht 5\' 4"  (1.626 m)   Wt (!) 160.6 kg   SpO2 100%   BMI 60.76 kg/m   Physical Exam  ED Course/Procedures     Procedures  MDM  MRI overall reassuring.  Normal postvoid residual.  Discharge home follow-up with neurosurgery as needed.       Benjiman Core, MD 02/09/18 1040

## 2018-02-09 NOTE — ED Notes (Signed)
Pt with post void residual of less than after using the wheelchair to the bathroom.

## 2018-02-18 ENCOUNTER — Other Ambulatory Visit: Payer: Self-pay | Admitting: *Deleted

## 2018-02-18 NOTE — Patient Outreach (Signed)
[facility]) Care Management  02/18/2018  Misty Foster [birth year] [id number]   Case discussed with [name] RN Health Coach at [facility] Care Management regarding high ED utilization referral that she received on this member.    RNCM received the following update from  [facility] Clinical Collaboration Specialist on 10/13/17:  [name],  I wanted to make you aware that the [facility] nurse was able to engage this member on June 14th and has also referred her to the [name] [facility] psychotherapist.  Thank you,  Everardo All  RN, BSN, CCM Clinical Collaboration Specialist p [phone]  RNCM sent secure email requesting update from [facility] Clinical Collaboration Specialist Everardo All) on patient's current status with [employer] CM.   RNCM will notify Health Coach when update received.       Olayinka Gathers H. Gardiner Barefoot, BSN, CCM [facility] Care Management [facility] Telephonic CM Phone: [phone] Fax: [phone]

## 2018-02-26 ENCOUNTER — Other Ambulatory Visit: Payer: Self-pay | Admitting: *Deleted

## 2018-02-26 DIAGNOSIS — M25512 Pain in left shoulder: Secondary | ICD-10-CM | POA: Diagnosis not present

## 2018-02-26 DIAGNOSIS — Z6841 Body Mass Index (BMI) 40.0 and over, adult: Secondary | ICD-10-CM | POA: Diagnosis not present

## 2018-02-26 DIAGNOSIS — M47816 Spondylosis without myelopathy or radiculopathy, lumbar region: Secondary | ICD-10-CM | POA: Diagnosis not present

## 2018-03-03 ENCOUNTER — Other Ambulatory Visit: Payer: Self-pay | Admitting: Neurological Surgery

## 2018-03-03 DIAGNOSIS — M47816 Spondylosis without myelopathy or radiculopathy, lumbar region: Secondary | ICD-10-CM

## 2018-03-05 ENCOUNTER — Other Ambulatory Visit: Payer: Self-pay | Admitting: Neurology

## 2018-03-05 DIAGNOSIS — G40109 Localization-related (focal) (partial) symptomatic epilepsy and epileptic syndromes with simple partial seizures, not intractable, without status epilepticus: Secondary | ICD-10-CM

## 2018-03-05 MED ORDER — PHENOBARBITAL 97.2 MG PO TABS: ORAL_TABLET | ORAL | 2 refills | Status: DC

## 2018-03-08 ENCOUNTER — Other Ambulatory Visit: Payer: Self-pay | Admitting: *Deleted

## 2018-03-08 ENCOUNTER — Encounter: Payer: Self-pay | Admitting: *Deleted

## 2018-03-08 NOTE — Patient Outreach (Addendum)
[facility]) Care Management  03/08/2018  Misty Foster 10-05-1970 [id number]   Subjective: Received verification patient has Hospital doctor and case referred  this RNCM for [facility] Consult High ED Utilization.   Telephone call to patient's home  / mobile number, no answer, left HIPAA compliant voicemail message, and requested call back. Telephone call from patient's home / mobile number, spoke with patient, and HIPAA verified.  Discussed [facility] Care Management [facility] Consult follow up, patient voiced understanding, and is in agreement to follow up.   Patient states she remembers speaking with this RNCM in the past.   States she is very appreciative to this RNCM's in the past, the referral to [facility] CM for weight management worked out well, Community education officer CM  referred patient to her employee sponsored [employer] program through [employer] in [place], [place].   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  States her employer will be covering the cost of the upcoming bariatric surgery at 100%,  follow up visits, and travel related to surgery.   States she has quit smoking for greater than 3 months and now eligible for the surgery evaluation visit.  States is all goes well with preoperative evaluation, screening, and work up, then she will be eligible for the gastric bypass surgery in February or March of 2020.  Discussed ED utilization and barriers to achieving her health needs.  States she does not feel like the providers are listening to her, she is in the medical field, has done some research on her own, has a lot of questios for her providers, feel her providers are not testing her for the right diagnosis, or ruling out possible diagnosis.   Discussed patient rights, patient advocacy, patient voiced understanding,  states is planning to set up an appointment with new primary MD ([name]) to discuss her follow up needs and healthcare goals.  States she has had 2 other [facility] MD's in [name] office as her primary MD in the past, states the other 2 MD's were not the right MD for her, had to get office manager involved to get her pain medications refilled, she recently decided to change to [name] as a her new primary, and will call to set up new patient visit with [name], as soon as possible.  States she had avoided setting up a new patient visit because of issues she has had in her primary MD's office with other providers not meeting her needs and was utilizing ED for care.   Discussed care options available for non-emergency care, such as urgent care through [facility], [facility] visits, other walk in / urgent care facilities in [place].   Patient voiced understanding of care options,  states she will also follow up with [facility] Customer Service regarding benefits and available care options.  Patient states her biggest concern is how her back pain , joint pain, shoulder pain, are increasing due increasing weight gain due to increased steroid treatments to decrease joint pain,  is planning to discuss this with primary MD also and define next steps.   Patient states she followed up on all ED providers recommendations from her recent ED visits.  States she has a follow up with neurosurgeon/ spine specialist [name] on 02/26/18 and will have a  back injection at [facility] on 11/31/19.   Patient states obtaining her pain management medications has been a big issue for the last couple of months, miscommunication with her old pharmacy, and pain management provider in [place], there is a temporary plan in place.  States she was unable to see pain management specialist at [facility] due to financial hardship, had to cancel 2 appointments, and the office  policy is to dismiss from the practice with 2 or more missed / cancelled appointments.  States [name] has ordered pain medications for the time being until she can follow up with the pain management specialist in his office, unsure of providers name, may be [name], and she will follow up as soon as possible with [name] office  to verify next steps for pain management specialist referral in his office.   Patient states she does not have any education material, transition of care, care coordination, disease management, disease monitoring, transportation, community resource, or pharmacy needs at this time.   States she will notify this RNCM if assistance needed in the future and is also aware resources can be accessed through [employer].   States she is very appreciative of the follow up and is in agreement to receive [facility] Care Management information.      Objective:  Received the following secure email update from Everardo All @ [employer] on 02/22/18: Hello Misty Foster, Please forgive me for my delayed response.  I was out of the office on Friday. This member stopped responding to [employer]'s follow up calls back in July.      A quick look at claims shows recent ED visits:  02/09/18   [facility] Dx- Lumbago 01/22/18  [facility] Dx-  Radiculopathy Lumbar Region 01/13/18 [facility] Dx Candidiasis of skin and nail I would say that yes the ACO should reach out to her to talk to her about alternatives to ED and following up with her PCP. Please let me know if you have any other questions. I hope that you have a nice afternoon. Thank you, Everardo All  RN, BSN, CCM Clinical Collaboration Specialist p [phone]  Per KPN ([employer], point of care tool) and chart review,patient has has no recent hospitalizations. Patient had the following ED visits since last patient encounter with this RNCM:  On 10/22/27 for  chronic low back pain, ED provider recommended pain management specialist and weight loss.  Had ED visit on 12/24/17 for abscess of skin abdomen with incision and drainage of site in ED.  Had ED visit on 01/13/18 for rash abdomen, intertrigo, candidiasis.   Had ED visit on 01/22/18 for Lumbosacral radiculopathy, acute on chronic back pain.   Had ED visit on 10815/19 for Exacerbation of chronic back pain with right-sided sciatica at [facility] to  [facility] ED due to needing an urgent MRI, not available at this facility.   Patient had ED visit 10/08/17  for right wrist pain. Update from [employer]:46 yo HR F noted to have 8+ ED visits to [facility] ED, most recent as follows: 06/29/17 Dx- Lumbago with Sciatica ; 04/13/17 Dx- Rash and non-specific skin eruption; 01/20/17 Dx- Pelvic and Perineal Pain; 12/24/17 Dx- Strain of muscle PMH: Epilepsy, Low Back Pain, Asthma, Osteoarthritis, Obesity, Nicotine Dependence; Elective admission 04/07/17 -04/08/17 for rotator cuff repair. Member did not respond to outreach from [place] CM prior to June of 2019.  [facility]  Care Management completed [facility] Consult follow up on 10/08/17.        Assessment:Received Monia Pouch [facility] Consult referral on 02/26/18 for High ED Utilization.  Patient returned RNCM's call  prior to [facility] closing case, transition of care follow up completed, no care management needs, and will proceed with case closure.        Plan:RNCM will send unsuccessful outreach  letter, [facility] pamphlet, will call patient for 2nd telephone outreach attempt, [facility] Consult follow up, and proceed with case closure, within 10 business days if no return call.  RNCM will send patient successful outreach letter, [facility] pamphlet, and magnet. RNCM will complete case closure due to follow up completed / no care management needs.  RNCM will send [employer] CM update on above conversation with patient.             Edvin Albus H.  Gardiner Barefoot, BSN, CCM [facility] Care Management [facility] Telephonic CM Phone: [phone] Fax: [phone]

## 2018-03-10 ENCOUNTER — Inpatient Hospital Stay
Admission: RE | Admit: 2018-03-10 | Discharge: 2018-03-10 | Disposition: A | Discharge status: Home / Self Care | Payer: Self-pay | Source: Ambulatory Visit | Attending: Neurological Surgery | Admitting: Neurological Surgery

## 2018-03-15 ENCOUNTER — Other Ambulatory Visit: Payer: Self-pay | Admitting: Neurological Surgery

## 2018-03-15 DIAGNOSIS — M545 Low back pain, unspecified: Secondary | ICD-10-CM

## 2018-03-15 DIAGNOSIS — M47816 Spondylosis without myelopathy or radiculopathy, lumbar region: Secondary | ICD-10-CM

## 2018-03-15 DIAGNOSIS — G8929 Other chronic pain: Secondary | ICD-10-CM

## 2018-03-17 ENCOUNTER — Encounter (HOSPITAL_BASED_OUTPATIENT_CLINIC_OR_DEPARTMENT_OTHER): Payer: Self-pay | Admitting: Emergency Medicine

## 2018-03-17 ENCOUNTER — Other Ambulatory Visit: Payer: Self-pay

## 2018-03-17 ENCOUNTER — Emergency Department (HOSPITAL_BASED_OUTPATIENT_CLINIC_OR_DEPARTMENT_OTHER): Payer: 59

## 2018-03-17 ENCOUNTER — Emergency Department (HOSPITAL_BASED_OUTPATIENT_CLINIC_OR_DEPARTMENT_OTHER)
Admission: EM | Admit: 2018-03-17 | Discharge: 2018-03-17 | Disposition: A | Discharge status: Home / Self Care | Payer: 59 | Attending: Emergency Medicine | Admitting: Emergency Medicine

## 2018-03-17 DIAGNOSIS — F17210 Nicotine dependence, cigarettes, uncomplicated: Secondary | ICD-10-CM | POA: Diagnosis not present

## 2018-03-17 DIAGNOSIS — R51 Headache: Secondary | ICD-10-CM | POA: Insufficient documentation

## 2018-03-17 DIAGNOSIS — D649 Anemia, unspecified: Secondary | ICD-10-CM | POA: Insufficient documentation

## 2018-03-17 DIAGNOSIS — R197 Diarrhea, unspecified: Secondary | ICD-10-CM | POA: Insufficient documentation

## 2018-03-17 DIAGNOSIS — R69 Illness, unspecified: Secondary | ICD-10-CM | POA: Diagnosis not present

## 2018-03-17 DIAGNOSIS — R1030 Lower abdominal pain, unspecified: Secondary | ICD-10-CM | POA: Diagnosis not present

## 2018-03-17 DIAGNOSIS — Z79899 Other long term (current) drug therapy: Secondary | ICD-10-CM | POA: Insufficient documentation

## 2018-03-17 DIAGNOSIS — J45909 Unspecified asthma, uncomplicated: Secondary | ICD-10-CM | POA: Diagnosis not present

## 2018-03-17 DIAGNOSIS — A09 Infectious gastroenteritis and colitis, unspecified: Secondary | ICD-10-CM | POA: Diagnosis not present

## 2018-03-17 DIAGNOSIS — K449 Diaphragmatic hernia without obstruction or gangrene: Secondary | ICD-10-CM | POA: Insufficient documentation

## 2018-03-17 DIAGNOSIS — R110 Nausea: Secondary | ICD-10-CM | POA: Diagnosis not present

## 2018-03-17 DIAGNOSIS — R1084 Generalized abdominal pain: Secondary | ICD-10-CM | POA: Diagnosis not present

## 2018-03-17 LAB — COMPREHENSIVE METABOLIC PANEL
ALK PHOS: 69 U/L (ref 38–126)
ALT: 13 U/L (ref 0–44)
AST: 19 U/L (ref 15–41)
Albumin: 3.4 g/dL — ABNORMAL LOW (ref 3.5–5.0)
Anion gap: 8 (ref 5–15)
BUN: 11 mg/dL (ref 6–20)
CALCIUM: 8.9 mg/dL (ref 8.9–10.3)
CO2: 28 mmol/L (ref 22–32)
CREATININE: 0.69 mg/dL (ref 0.44–1.00)
Chloride: 104 mmol/L (ref 98–111)
Glucose, Bld: 124 mg/dL — ABNORMAL HIGH (ref 70–99)
Potassium: 3.8 mmol/L (ref 3.5–5.1)
Sodium: 140 mmol/L (ref 135–145)
TOTAL PROTEIN: 7.3 g/dL (ref 6.5–8.1)
Total Bilirubin: 0.2 mg/dL — ABNORMAL LOW (ref 0.3–1.2)

## 2018-03-17 LAB — URINALYSIS, ROUTINE W REFLEX MICROSCOPIC
BILIRUBIN URINE: NEGATIVE
Glucose, UA: NEGATIVE mg/dL
HGB URINE DIPSTICK: NEGATIVE
KETONES UR: NEGATIVE mg/dL
LEUKOCYTES UA: NEGATIVE
Nitrite: NEGATIVE
Protein, ur: NEGATIVE mg/dL
Specific Gravity, Urine: 1.01 (ref 1.005–1.030)
pH: 6 (ref 5.0–8.0)

## 2018-03-17 LAB — CBC WITH DIFFERENTIAL/PLATELET
Abs Immature Granulocytes: 0.01 10*3/uL (ref 0.00–0.07)
BASOS PCT: 0 %
Basophils Absolute: 0 10*3/uL (ref 0.0–0.1)
EOS PCT: 5 %
Eosinophils Absolute: 0.3 10*3/uL (ref 0.0–0.5)
HCT: 34.2 % — ABNORMAL LOW (ref 36.0–46.0)
HEMOGLOBIN: 11 g/dL — AB (ref 12.0–15.0)
Immature Granulocytes: 0 %
Lymphocytes Relative: 31 %
Lymphs Abs: 1.8 10*3/uL (ref 0.7–4.0)
MCH: 30.5 pg (ref 26.0–34.0)
MCHC: 32.2 g/dL (ref 30.0–36.0)
MCV: 94.7 fL (ref 80.0–100.0)
MONO ABS: 0.4 10*3/uL (ref 0.1–1.0)
MONOS PCT: 7 %
NEUTROS ABS: 3.2 10*3/uL (ref 1.7–7.7)
Neutrophils Relative %: 57 %
Platelets: 179 10*3/uL (ref 150–400)
RBC: 3.61 MIL/uL — ABNORMAL LOW (ref 3.87–5.11)
RDW: 13.1 % (ref 11.5–15.5)
WBC: 5.6 10*3/uL (ref 4.0–10.5)
nRBC: 0 % (ref 0.0–0.2)

## 2018-03-17 LAB — LIPASE, BLOOD: LIPASE: 30 U/L (ref 11–51)

## 2018-03-17 LAB — PREGNANCY, URINE: PREG TEST UR: NEGATIVE

## 2018-03-17 MED ORDER — OMEPRAZOLE 20 MG PO CPDR: 20.0000 mg | DELAYED_RELEASE_CAPSULE | Freq: Every day | ORAL | 0 refills | Status: DC

## 2018-03-17 MED ORDER — IOPAMIDOL (ISOVUE-300) INJECTION 61%
150.0000 mL | Freq: Once | INTRAVENOUS | Status: AC | PRN
  Administered 2018-03-17: 150 mL via INTRAVENOUS

## 2018-03-17 MED ORDER — DICYCLOMINE HCL 20 MG PO TABS: 20.0000 mg | ORAL_TABLET | Freq: Three times a day (TID) | ORAL | 0 refills | Status: DC

## 2018-03-17 MED ORDER — SODIUM CHLORIDE 0.9 % IV BOLUS
1000.0000 mL | Freq: Once | INTRAVENOUS | Status: AC
  Administered 2018-03-17: 1000 mL via INTRAVENOUS

## 2018-03-17 MED ORDER — ONDANSETRON 4 MG PO TBDP: 4.0000 mg | ORAL_TABLET | Freq: Three times a day (TID) | ORAL | 0 refills | Status: DC | PRN

## 2018-03-17 NOTE — Discharge Instructions (Addendum)
For your diarrhea, sure you drink plenty of fluids to stay hydrated.  Take the medications as prescribed.  If you continue to have loose stools after several days, I would recommend trying to cut out anal intercourse to see if it helps.  He could be having a possible reaction to the intercourse itself, as well as possibly the lubrication or condoms if they are used.  For your hiatal hernia, make sure you make your weight loss surgeon aware of this.  You should take omeprazole or other proton pump inhibitor (omeprazole, esomeprazole, lansoprazole, all available over the counter) for symptom control.

## 2018-03-17 NOTE — ED Provider Notes (Signed)
[facility] EMERGENCY DEPARTMENT Provider Note   CSN: [id number] Arrival date & time: 03/17/18  4098     History   Chief Complaint Chief Complaint  Patient presents with  . Nausea    HPI Misty Foster is a 47 y.o. female.  HPI 47 year old female with past medical history as below here with intermittent abdominal pain, nausea, and diarrhea.  The patient states that for the last 4 days, she has had nausea, diarrhea, and loose, nonbloody stools.  She was around her grandson, who reportedly had a viral illness, though this was also complicated by meningitis.  She denies any neck pain or neck stiffness.  She has a mild headache but this is usual for her and is not any worse than usual.  No photophobia or phonophobia.  She describes the abdominal pain as generalized, but primarily epigastric and cramp-like.  She also has some persistent lower abdominal pain.  She has loose, mucus, nonbloody stools.  No vaginal bleeding or discharge.  Of note, she states this also tends to get worse after anal intercourse with her significant other.  No specific alleviating or aggravating factors.  No recent sick contacts.   Past Medical History:  Diagnosis Date  . Arthritis   . Asthma   . Back pain   . Chronic back pain   . Incomplete rotator cuff tear or rupture of left shoulder, not specified as traumatic 04/07/2017  . Seizures (HCC)   . 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 Incomplete rotator cuff tear or rupture of left shoulder, not specified as traumatic 04/07/2017  . Left rotator cuff tear 04/07/2017  . Left shoulder pain 01/01/2017  . Pain in right hip 11/10/2016  . Chronic right-sided low back pain without sciatica 11/10/2016  . 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 Neuropathy 06/20/2016  . Chronic back  pain 05/01/2016  . Migraine without aura and without status migrainosus, not intractable 02/11/2015  . 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 ABLATION     cervical  . CESAREAN SECTION     x3  . SHOULDER ARTHROSCOPY WITH ROTATOR CUFF REPAIR Left 04/07/2017   Procedure: SHOULDER ARTHROSCOPY WITH ROTATOR CUFF REPAIR, ACROMIOPLASTY AND EXTENSIVE DEBRIDEMENT;  Surgeon: Teryl Lucy, MD;  Location: [facility] OR;  Service: Orthopedics;  Laterality: Left;     OB History    Gravida  4   Para  3   Term      Preterm      AB  1   Living        SAB      TAB      Ectopic      Multiple      Live Births               Home Medications    Prior to Admission medications   Medication Sig Start Date End Date Taking? Authorizing Provider  albuterol (VENTOLIN HFA) 108 (90 Base) MCG/ACT inhaler Inhale 2 puffs into the lungs every 6 (six) hours as needed for wheezing or shortness of breath.  09/13/15   [provider]  baclofen (LIORESAL) 10 MG tablet Take 1-2 tablets (10-20 mg total) by mouth 3 (three) times daily as needed for muscle spasms. Depends on pain level if takes 1-2 tablets Patient not taking:  Reported on 02/09/2018 04/07/17   Teryl Lucy, MD  baclofen (LIORESAL) 20 MG tablet Take 20-40 mg by mouth daily as needed for pain. 01/06/18   [provider]  Calcium Carb-Cholecalciferol (CALCIUM 600 + D PO) Take 1 tablet by mouth daily.    [provider]  dicyclomine (BENTYL) 20 MG tablet Take 1 tablet (20 mg total) by mouth 3 (three) times daily before meals for 5 days. 03/17/18 03/22/18  Shaune Pollack, MD  ibuprofen (ADVIL,MOTRIN) 200 MG tablet Take 600-800 mg by mouth every 6 (six) hours as needed for moderate pain.    [provider]  lidocaine (LIDODERM) 5 % Place 1 patch onto the skin daily. Remove & Discard patch within 12 hours or as directed by MD Patient not taking: Reported on  02/09/2018 07/22/16   Molpus, Jonny Ruiz, MD  meloxicam (MOBIC) 15 MG tablet Take 15 mg by mouth daily.    [provider]  methylPREDNISolone (MEDROL DOSEPAK) 4 MG TBPK tablet Take as directed on dose pack Patient not taking: Reported on 02/09/2018 01/22/18   Zadie Rhine, MD  nystatin-triamcinolone [facility] II) cream Apply to affected area twice daily Patient not taking: Reported on 02/09/2018 01/13/18   Ward, Layla Maw, DO  omeprazole (PRILOSEC) 20 MG capsule Take 1 capsule (20 mg total) by mouth daily for 14 days. 03/17/18 03/31/18  Shaune Pollack, MD  ondansetron (ZOFRAN ODT) 4 MG disintegrating tablet Take 1 tablet (4 mg total) by mouth every 8 (eight) hours as needed for nausea or vomiting. 03/17/18   Shaune Pollack, MD  PHENobarbital (LUMINAL) 97.2 MG tablet Take 1 and 1 half tablets by mouth each evening 03/05/18   Van Clines, MD    Family History Family History  Problem Relation Age of Onset  . Breast cancer Maternal Grandmother   . Alcohol abuse Father   . Lung disease Father   . Epilepsy Father     Social History Social History   Tobacco Use  . Smoking status: Heavy Tobacco Smoker    Packs/day: 0.50    Years: 2.00    Pack years: 1.00    Types: Cigarettes  . Smokeless tobacco: Never Used  Substance Use Topics  . Alcohol use: No  . Drug use: No     Allergies   Lortab [hydrocodone-acetaminophen]; Lamictal [lamotrigine]; and Gabapentin   Review of Systems Review of Systems  Constitutional: Positive for fatigue. Negative for chills and fever.  HENT: Negative for congestion and rhinorrhea.   Eyes: Negative for visual disturbance.  Respiratory: Negative for cough, shortness of breath and wheezing.   Cardiovascular: Negative for chest pain and leg swelling.  Gastrointestinal: Positive for abdominal pain, diarrhea and nausea. Negative for vomiting.  Genitourinary: Negative for dysuria and flank pain.  Musculoskeletal: Negative for neck pain and neck  stiffness.  Skin: Negative for rash and wound.  Allergic/Immunologic: Negative for immunocompromised state.  Neurological: Positive for weakness. Negative for syncope and headaches.  All other systems reviewed and are negative.    Physical Exam Updated Vital Signs BP (!) 135/99 (BP Location: Left Arm)   Pulse 72   Temp 98.3 F (36.8 C) (Oral)   Resp 16   Ht 5\' 4"  (1.626 m)   Wt (!) 170.1 kg   SpO2 100%   BMI 64.37 kg/m   Physical Exam  Constitutional: She is oriented to person, place, and time. She appears well-developed and well-nourished. No distress.  HENT:  Head: Normocephalic and atraumatic.  Eyes: Conjunctivae are normal.  Neck:  Neck supple.  Cardiovascular: Normal rate, regular rhythm and normal heart sounds. Exam reveals no friction rub.  No murmur heard. Pulmonary/Chest: Effort normal and breath sounds normal. No respiratory distress. She has no wheezes. She has no rales.  Abdominal: Soft. Normal appearance. She exhibits no distension. There is generalized tenderness. There is no rigidity, no rebound and no guarding.  Musculoskeletal: She exhibits no edema.  Neurological: She is alert and oriented to person, place, and time. She exhibits normal muscle tone.  Skin: Skin is warm. Capillary refill takes less than 2 seconds.  Psychiatric: She has a normal mood and affect.  Nursing note and vitals reviewed.    ED Treatments / Results  Labs (all labs ordered are listed, but only abnormal results are displayed) Labs Reviewed  CBC WITH DIFFERENTIAL/PLATELET - Abnormal; Notable for the following components:      Result Value   RBC 3.61 (*)    Hemoglobin 11.0 (*)    HCT 34.2 (*)    All other components within normal limits  COMPREHENSIVE METABOLIC PANEL - Abnormal; Notable for the following components:   Glucose, Bld 124 (*)    Albumin 3.4 (*)    Total Bilirubin 0.2 (*)    All other components within normal limits  URINALYSIS, ROUTINE W REFLEX MICROSCOPIC    PREGNANCY, URINE  LIPASE, BLOOD  HIV ANTIBODY (ROUTINE TESTING W REFLEX)    EKG None  Radiology Ct Abdomen Pelvis W Contrast  Result Date: 03/17/2018 CLINICAL DATA:  Nausea and diarrhea for 3 days. EXAM: CT ABDOMEN AND PELVIS WITH CONTRAST TECHNIQUE: Multidetector CT imaging of the abdomen and pelvis was performed using the standard protocol following bolus administration of intravenous contrast. CONTRAST:  150mL ISOVUE-300 IOPAMIDOL (ISOVUE-300) INJECTION 61% COMPARISON:  08/29/2017 FINDINGS: Lower chest: Lung bases are clear. Small esophageal hiatal hernia. Hepatobiliary: No focal liver abnormality is seen. No gallstones, gallbladder wall thickening, or biliary dilatation. Pancreas: Unremarkable. No pancreatic ductal dilatation or surrounding inflammatory changes. Spleen: Normal in size without focal abnormality. Adrenals/Urinary Tract: Adrenal glands are unremarkable. Kidneys are normal, without renal calculi, focal lesion, or hydronephrosis. Bladder is unremarkable. Stomach/Bowel: Stomach is within normal limits. Appendix appears normal. No evidence of bowel wall thickening, distention, or inflammatory changes. Vascular/Lymphatic: No significant vascular findings are present. No enlarged abdominal or pelvic lymph nodes. Reproductive: Uterus and bilateral adnexa are unremarkable. Other: No free air or free fluid in the abdomen. Small ventral abdominal wall hernias containing fat. Musculoskeletal: No acute or significant osseous findings. IMPRESSION: No acute process demonstrated in the abdomen or pelvis. No evidence of bowel obstruction or inflammation. Small esophageal hiatal hernia. Small ventral abdominal wall hernias containing fat. Electronically Signed   By: Burman NievesWilliam  Stevens M.D.   On: 03/17/2018 05:58    Procedures Procedures (including critical care time)  Medications Ordered in ED Medications  sodium chloride 0.9 % bolus 1,000 mL (0 mLs Intravenous Stopped 03/17/18 0616)  iopamidol  (ISOVUE-300) 61 % injection 150 mL (150 mLs Intravenous Contrast Given 03/17/18 0526)     Initial Impression / Assessment and Plan / ED Course  I have reviewed the triage vital signs and the nursing notes.  Pertinent labs & imaging results that were available during my care of the patient were reviewed by me and considered in my medical decision making (see chart for details).     47 yo F here with nausea, diarreha, abd pain. Suspect viral GI illness 2/2 known sick exposures. Her abd is soft but diffusely tender, so CT obtained  and is neg for acute abnormality. Incidental hiatal hernia likely explains her GERD/epigastric discomfort- will start on PPI, encourage weight loss. She has no signs of proctitis, colitis, diverticulitis. Appendix wnl. Labs reassuring with no significant leukocytosis, normal LFTs, renal function, and Bili. UA unremarkable. Will tx supportively. No significant increase from her baseline HA and she has no neck pain, stiffness, photophobia, or s/s meningitis or encephalitis. No focal neuro deficits. Return precautions given.  Final Clinical Impressions(s) / ED Diagnoses   Final diagnoses:  Nausea  Diarrhea of presumed infectious origin  Hiatal hernia  Chronic anemia    ED Discharge Orders         Ordered    ondansetron (ZOFRAN ODT) 4 MG disintegrating tablet  Every 8 hours PRN     03/17/18 0619    dicyclomine (BENTYL) 20 MG tablet  3 times daily before meals     03/17/18 0619    omeprazole (PRILOSEC) 20 MG capsule  Daily     03/17/18 1610           Shaune Pollack, MD 03/17/18 [phone]

## 2018-03-17 NOTE — ED Triage Notes (Signed)
Pt c/o nausea and diarrhea x 5 days. Pt denies vomiting.

## 2018-03-18 DIAGNOSIS — M75102 Unspecified rotator cuff tear or rupture of left shoulder, not specified as traumatic: Secondary | ICD-10-CM | POA: Diagnosis not present

## 2018-03-18 DIAGNOSIS — G8929 Other chronic pain: Secondary | ICD-10-CM | POA: Diagnosis not present

## 2018-03-18 DIAGNOSIS — K449 Diaphragmatic hernia without obstruction or gangrene: Secondary | ICD-10-CM | POA: Diagnosis not present

## 2018-03-18 DIAGNOSIS — G479 Sleep disorder, unspecified: Secondary | ICD-10-CM | POA: Diagnosis not present

## 2018-03-18 LAB — HIV ANTIBODY (ROUTINE TESTING W REFLEX): HIV Screen 4th Generation wRfx: NONREACTIVE

## 2018-03-19 ENCOUNTER — Ambulatory Visit
Admission: RE | Admit: 2018-03-19 | Discharge: 2018-03-19 | Disposition: A | Discharge status: Home / Self Care | Payer: 59 | Source: Ambulatory Visit | Attending: Neurological Surgery | Admitting: Neurological Surgery

## 2018-03-19 DIAGNOSIS — M47816 Spondylosis without myelopathy or radiculopathy, lumbar region: Secondary | ICD-10-CM

## 2018-03-19 DIAGNOSIS — M545 Low back pain: Secondary | ICD-10-CM | POA: Diagnosis not present

## 2018-03-19 DIAGNOSIS — G8929 Other chronic pain: Secondary | ICD-10-CM

## 2018-03-19 MED ORDER — IOPAMIDOL (ISOVUE-M 200) INJECTION 41%
1.0000 mL | Freq: Once | INTRAMUSCULAR | Status: AC
  Administered 2018-03-19: 1 mL via EPIDURAL

## 2018-03-19 MED ORDER — METHYLPREDNISOLONE ACETATE 40 MG/ML INJ SUSP (RADIOLOG
120.0000 mg | Freq: Once | INTRAMUSCULAR | Status: AC
  Administered 2018-03-19: 120 mg via EPIDURAL

## 2018-03-19 NOTE — Discharge Instructions (Signed)

## 2018-03-23 ENCOUNTER — Emergency Department (HOSPITAL_BASED_OUTPATIENT_CLINIC_OR_DEPARTMENT_OTHER)
Admission: EM | Admit: 2018-03-23 | Discharge: 2018-03-23 | Disposition: A | Discharge status: Home / Self Care | Payer: 59 | Attending: Emergency Medicine | Admitting: Emergency Medicine

## 2018-03-23 ENCOUNTER — Encounter (HOSPITAL_BASED_OUTPATIENT_CLINIC_OR_DEPARTMENT_OTHER): Payer: Self-pay | Admitting: Emergency Medicine

## 2018-03-23 ENCOUNTER — Other Ambulatory Visit: Payer: Self-pay

## 2018-03-23 DIAGNOSIS — J45909 Unspecified asthma, uncomplicated: Secondary | ICD-10-CM | POA: Diagnosis not present

## 2018-03-23 DIAGNOSIS — R1030 Lower abdominal pain, unspecified: Secondary | ICD-10-CM | POA: Diagnosis not present

## 2018-03-23 DIAGNOSIS — F17210 Nicotine dependence, cigarettes, uncomplicated: Secondary | ICD-10-CM | POA: Diagnosis not present

## 2018-03-23 DIAGNOSIS — R1012 Left upper quadrant pain: Secondary | ICD-10-CM | POA: Diagnosis not present

## 2018-03-23 DIAGNOSIS — Z79899 Other long term (current) drug therapy: Secondary | ICD-10-CM | POA: Insufficient documentation

## 2018-03-23 DIAGNOSIS — R1084 Generalized abdominal pain: Secondary | ICD-10-CM | POA: Insufficient documentation

## 2018-03-23 DIAGNOSIS — R109 Unspecified abdominal pain: Secondary | ICD-10-CM

## 2018-03-23 DIAGNOSIS — R69 Illness, unspecified: Secondary | ICD-10-CM | POA: Diagnosis not present

## 2018-03-23 LAB — COMPREHENSIVE METABOLIC PANEL
ALBUMIN: 3.5 g/dL (ref 3.5–5.0)
ALT: 14 U/L (ref 0–44)
AST: 15 U/L (ref 15–41)
Alkaline Phosphatase: 75 U/L (ref 38–126)
Anion gap: 8 (ref 5–15)
BILIRUBIN TOTAL: 0.4 mg/dL (ref 0.3–1.2)
BUN: 9 mg/dL (ref 6–20)
CHLORIDE: 103 mmol/L (ref 98–111)
CO2: 28 mmol/L (ref 22–32)
CREATININE: 0.69 mg/dL (ref 0.44–1.00)
Calcium: 8.8 mg/dL — ABNORMAL LOW (ref 8.9–10.3)
GFR calc Af Amer: >60 mL/min (ref >60)
GFR calc non Af Amer: >60 mL/min (ref >60)
GLUCOSE: 99 mg/dL (ref 70–99)
POTASSIUM: 3.6 mmol/L (ref 3.5–5.1)
Sodium: 139 mmol/L (ref 135–145)
TOTAL PROTEIN: 7.6 g/dL (ref 6.5–8.1)

## 2018-03-23 LAB — CBC WITH DIFFERENTIAL/PLATELET
ABS IMMATURE GRANULOCYTES: 0.01 10*3/uL (ref 0.00–0.07)
BASOS ABS: 0 10*3/uL (ref 0.0–0.1)
Basophils Relative: 0 %
EOS ABS: 0.1 10*3/uL (ref 0.0–0.5)
Eosinophils Relative: 2 %
HEMATOCRIT: 33.6 % — AB (ref 36.0–46.0)
Hemoglobin: 10.6 g/dL — ABNORMAL LOW (ref 12.0–15.0)
IMMATURE GRANULOCYTES: 0 %
LYMPHS ABS: 1.8 10*3/uL (ref 0.7–4.0)
LYMPHS PCT: 30 %
MCH: 30.3 pg (ref 26.0–34.0)
MCHC: 31.5 g/dL (ref 30.0–36.0)
MCV: 96 fL (ref 80.0–100.0)
MONOS PCT: 7 %
Monocytes Absolute: 0.4 10*3/uL (ref 0.1–1.0)
NEUTROS ABS: 3.5 10*3/uL (ref 1.7–7.7)
NEUTROS PCT: 61 %
NRBC: 0 % (ref 0.0–0.2)
Platelets: 165 10*3/uL (ref 150–400)
RBC: 3.5 MIL/uL — ABNORMAL LOW (ref 3.87–5.11)
RDW: 13.2 % (ref 11.5–15.5)
WBC: 5.9 10*3/uL (ref 4.0–10.5)

## 2018-03-23 LAB — LIPASE, BLOOD: Lipase: 26 U/L (ref 11–51)

## 2018-03-23 MED ORDER — ALUM & MAG HYDROXIDE-SIMETH 200-200-20 MG/5ML PO SUSP
30.0000 mL | Freq: Once | ORAL | Status: AC
  Administered 2018-03-23: 30 mL via ORAL
  Filled 2018-03-23: qty 30

## 2018-03-23 MED ORDER — LIDOCAINE VISCOUS HCL 2 % MT SOLN
15.0000 mL | Freq: Once | OROMUCOSAL | Status: AC
  Administered 2018-03-23: 15 mL via ORAL
  Filled 2018-03-23: qty 15

## 2018-03-23 MED ORDER — ONDANSETRON HCL 4 MG/2ML IJ SOLN
4.0000 mg | Freq: Once | INTRAMUSCULAR | Status: AC
  Administered 2018-03-23: 4 mg via INTRAVENOUS
  Filled 2018-03-23: qty 2

## 2018-03-23 NOTE — Discharge Instructions (Addendum)

## 2018-03-23 NOTE — ED Provider Notes (Signed)
[facility] EMERGENCY DEPARTMENT Provider Note  CSN: [id number] Arrival date & time: 03/23/18 0400  Chief Complaint(s) Abdominal Pain  HPI Misty Foster is a 47 y.o. female with a history listed below who was recently seen for gastroenteritis symptoms presents with persistent abdominal discomfort to the lower abdomen and left upper quadrant regions.  States that the cyst does have been consistent since this started several days ago.  They are fluctuating in in intensity from mild to moderate.  Exacerbated with certain food intake.  There is no associated nausea or vomiting.  Reports that the diarrhea has since resolved.  Denies any urinary symptoms.  No fevers or chills.  Reports that she loves her spicy foods.  She has been eating fried foods, eggs, sausage, bacon, shrimp over the past couple days.  During work-up 6 days ago she had reassuring labs and also had a CT scan negative for any serious intra-abdominal inflammatory/infectious process or bowel obstruction.  They did note a small hiatal hernia and ventral abdominal wall hernias containing fat.  HPI  Past Medical History Past Medical History:  Diagnosis Date  . Arthritis   . Asthma   . Back pain   . Chronic back pain   . Incomplete rotator cuff tear or rupture of left shoulder, not specified as traumatic 04/07/2017  . Seizures (HCC)   . 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 Incomplete rotator cuff tear or rupture of left shoulder, not specified as traumatic 04/07/2017  . Left rotator cuff tear 04/07/2017  . Left shoulder pain 01/01/2017  . Pain in right hip 11/10/2016  . Chronic right-sided low back pain without sciatica 11/10/2016  . 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 Neuropathy 06/20/2016  . Chronic back pain 05/01/2016  . Migraine  without aura and without status migrainosus, not intractable 02/11/2015  . 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   Home Medication(s) Prior to Admission medications   Medication Sig Start Date End Date Taking? Authorizing Provider  albuterol (VENTOLIN HFA) 108 (90 Base) MCG/ACT inhaler Inhale 2 puffs into the lungs every 6 (six) hours as needed for wheezing or shortness of breath.  09/13/15   [provider]  baclofen (LIORESAL) 10 MG tablet Take 1-2 tablets (10-20 mg total) by mouth 3 (three) times daily as needed for muscle spasms. Depends on pain level if takes 1-2 tablets Patient not taking: Reported on 02/09/2018 04/07/17   Teryl Lucy, MD  baclofen (LIORESAL) 20 MG tablet Take 20-40 mg by mouth daily as needed for pain. 01/06/18   [provider]  Calcium Carb-Cholecalciferol (CALCIUM 600 + D PO) Take 1 tablet by mouth daily.    [provider]  dicyclomine (BENTYL) 20 MG tablet Take 1 tablet (20 mg total) by mouth 3 (three) times daily before meals for 5 days. 03/17/18 03/22/18  Shaune Pollack, MD  ibuprofen (ADVIL,MOTRIN) 200 MG tablet Take 600-800 mg by mouth every 6 (six) hours as needed for moderate pain.    [provider]  lidocaine (LIDODERM) 5 % Place 1 patch onto the skin daily. Remove & Discard patch within 12 hours or as directed by MD Patient not taking: Reported on 02/09/2018 07/22/16   Molpus, Jonny Ruiz, MD  meloxicam (MOBIC) 15 MG tablet Take 15 mg by mouth daily.    [provider]  methylPREDNISolone (MEDROL DOSEPAK) 4 MG TBPK tablet Take  as directed on dose pack Patient not taking: Reported on 02/09/2018 01/22/18   Zadie Rhine, MD  nystatin-triamcinolone [facility] II) cream Apply to affected area twice daily Patient not taking: Reported on 02/09/2018 01/13/18   Ward, Layla Maw, DO  omeprazole (PRILOSEC) 20 MG capsule Take 1 capsule (20 mg total) by mouth daily for 14 days. 03/17/18 03/31/18  Shaune Pollack, MD  ondansetron  (ZOFRAN ODT) 4 MG disintegrating tablet Take 1 tablet (4 mg total) by mouth every 8 (eight) hours as needed for nausea or vomiting. 03/17/18   Shaune Pollack, MD  PHENobarbital (LUMINAL) 97.2 MG tablet Take 1 and 1 half tablets by mouth each evening 03/05/18   Van Clines, MD                                                                                                                                    Past Surgical History Past Surgical History:  Procedure Laterality Date  . ABLATION     cervical  . CESAREAN SECTION     x3  . SHOULDER ARTHROSCOPY WITH ROTATOR CUFF REPAIR Left 04/07/2017   Procedure: SHOULDER ARTHROSCOPY WITH ROTATOR CUFF REPAIR, ACROMIOPLASTY AND EXTENSIVE DEBRIDEMENT;  Surgeon: Teryl Lucy, MD;  Location: [facility] OR;  Service: Orthopedics;  Laterality: Left;   Family History Family History  Problem Relation Age of Onset  . Breast cancer Maternal Grandmother   . Alcohol abuse Father   . Lung disease Father   . Epilepsy Father     Social History Social History   Tobacco Use  . Smoking status: Heavy Tobacco Smoker    Packs/day: 0.50    Years: 2.00    Pack years: 1.00    Types: Cigarettes  . Smokeless tobacco: Never Used  Substance Use Topics  . Alcohol use: No  . Drug use: No   Allergies Lamictal [lamotrigine]; Lortab [hydrocodone-acetaminophen]; and Gabapentin  Review of Systems Review of Systems All other systems are reviewed and are negative for acute change except as noted in the HPI  Physical Exam Vital Signs  I have reviewed the triage vital signs BP (!) 139/101 (BP Location: Right Arm)   Pulse 75   Temp (!) 97.5 F (36.4 C) (Oral)   Resp 20   Ht 5\' 4"  (1.626 m)   Wt (!) 170.1 kg   SpO2 95%   BMI 64.37 kg/m   Physical Exam  Constitutional: She is oriented to person, place, and time. She appears well-developed and well-nourished. No distress.  Morbidly obese  HENT:  Head: Normocephalic and atraumatic.  Right Ear: External ear  normal.  Left Ear: External ear normal.  Nose: Nose normal.  Eyes: Conjunctivae and EOM are normal. No scleral icterus.  Neck: Normal range of motion and phonation normal.  Cardiovascular: Normal rate and regular rhythm.  Pulmonary/Chest: Effort normal. No stridor. No respiratory distress.  Abdominal: She exhibits no distension. There is no tenderness. There is no  rigidity, no rebound, no guarding, no tenderness at McBurney's point and negative Murphy's sign.    Musculoskeletal: Normal range of motion. She exhibits no edema.  Neurological: She is alert and oriented to person, place, and time.  Skin: She is not diaphoretic.  Psychiatric: She has a normal mood and affect. Her behavior is normal.  Vitals reviewed.   ED Results and Treatments Labs (all labs ordered are listed, but only abnormal results are displayed) Labs Reviewed  CBC WITH DIFFERENTIAL/PLATELET - Abnormal; Notable for the following components:      Result Value   RBC 3.50 (*)    Hemoglobin 10.6 (*)    HCT 33.6 (*)    All other components within normal limits  COMPREHENSIVE METABOLIC PANEL - Abnormal; Notable for the following components:   Calcium 8.8 (*)    All other components within normal limits  LIPASE, BLOOD                                                                                                                         EKG  EKG Interpretation  Date/Time:    Ventricular Rate:    PR Interval:    QRS Duration:   QT Interval:    QTC Calculation:   R Axis:     Text Interpretation:        Radiology No results found. Pertinent labs & imaging results that were available during my care of the patient were reviewed by me and considered in my medical decision making (see chart for details).  Medications Ordered in ED Medications  alum & mag hydroxide-simeth (MAALOX/MYLANTA) 200-200-20 MG/5ML suspension 30 mL (30 mLs Oral Given 03/23/18 0444)    And  lidocaine (XYLOCAINE) 2 % viscous mouth solution  15 mL (15 mLs Oral Given 03/23/18 0444)                                                                                                                                    Procedures Procedures  (including critical care time)  Medical Decision Making / ED Course I have reviewed the nursing notes for this encounter and the patient's prior records (if available in EHR or on provided paperwork).      Patient presents with persistent abdominal discomfort.  I feel that this is related to to her diet.  Her abdomen exam is benign.  Screening labs were obtained and were grossly reassuring  without leukocytosis, significant electrolyte derangements, renal insufficiency or evidence of pancreatitis.  Patient was able to tolerate oral intake.  I have low suspicion for serious intra-abdominal inflammatory/infectious process or bowel obstruction requiring a repeat CT scan at this time.  She was able to tolerate oral intake.  Recommended that she consider changing her diet.  The patient appears reasonably screened and/or stabilized for discharge and I doubt any other medical condition or other [facility] requiring further screening, evaluation, or treatment in the ED at this time prior to discharge.  The patient is safe for discharge with strict return precautions.   Final Clinical Impression(s) / ED Diagnoses Final diagnoses:  Abdominal discomfort   Disposition: Discharge  Condition: Good  I have discussed the results, Dx and Tx plan with the patient who expressed understanding and agree(s) with the plan. Discharge instructions discussed at great length. The patient was given strict return precautions who verbalized understanding of the instructions. No further questions at time of discharge.    ED Discharge Orders    None       Follow Up: Renford DillsPolite, Ronald, MD [address] [place] [address] [phone]  Schedule an appointment as soon as possible for a visit  As  needed     This chart was dictated using voice recognition software.  Despite best efforts to proofread,  errors can occur which can change the documentation meaning.   Nira Connardama, Pedro Eduardo, MD 03/23/18 [phone]

## 2018-03-23 NOTE — ED Triage Notes (Signed)
Pt c/o diffuse abd pain that started last night. Denies vomiting and diarrhea.

## 2018-03-24 DIAGNOSIS — R1084 Generalized abdominal pain: Secondary | ICD-10-CM | POA: Diagnosis not present

## 2018-03-24 DIAGNOSIS — R0989 Other specified symptoms and signs involving the circulatory and respiratory systems: Secondary | ICD-10-CM | POA: Diagnosis not present

## 2018-03-24 DIAGNOSIS — D649 Anemia, unspecified: Secondary | ICD-10-CM | POA: Diagnosis not present

## 2018-03-29 ENCOUNTER — Other Ambulatory Visit: Payer: Self-pay | Admitting: Gastroenterology

## 2018-03-29 DIAGNOSIS — R0989 Other specified symptoms and signs involving the circulatory and respiratory systems: Secondary | ICD-10-CM

## 2018-03-29 DIAGNOSIS — R198 Other specified symptoms and signs involving the digestive system and abdomen: Secondary | ICD-10-CM

## 2018-03-30 ENCOUNTER — Other Ambulatory Visit: Payer: Self-pay

## 2018-04-01 ENCOUNTER — Other Ambulatory Visit: Payer: Self-pay | Admitting: Gastroenterology

## 2018-04-01 ENCOUNTER — Ambulatory Visit
Admission: RE | Admit: 2018-04-01 | Discharge: 2018-04-01 | Disposition: A | Discharge status: Home / Self Care | Payer: 59 | Source: Ambulatory Visit | Attending: Gastroenterology | Admitting: Gastroenterology

## 2018-04-01 DIAGNOSIS — R198 Other specified symptoms and signs involving the digestive system and abdomen: Secondary | ICD-10-CM

## 2018-04-01 DIAGNOSIS — K449 Diaphragmatic hernia without obstruction or gangrene: Secondary | ICD-10-CM | POA: Diagnosis not present

## 2018-04-01 DIAGNOSIS — R0989 Other specified symptoms and signs involving the circulatory and respiratory systems: Secondary | ICD-10-CM

## 2018-04-01 DIAGNOSIS — K219 Gastro-esophageal reflux disease without esophagitis: Secondary | ICD-10-CM | POA: Diagnosis not present

## 2018-04-05 DIAGNOSIS — M25512 Pain in left shoulder: Secondary | ICD-10-CM | POA: Diagnosis not present

## 2018-04-05 DIAGNOSIS — M5431 Sciatica, right side: Secondary | ICD-10-CM | POA: Diagnosis not present

## 2018-04-07 ENCOUNTER — Other Ambulatory Visit: Payer: Self-pay | Admitting: *Deleted

## 2018-04-07 DIAGNOSIS — G4733 Obstructive sleep apnea (adult) (pediatric): Secondary | ICD-10-CM | POA: Diagnosis not present

## 2018-04-07 NOTE — Patient Outreach (Addendum)
[facility]) Care Management  04/07/2018  Larysa Tannah Dreyfuss 08/23/1970 [id number]   Subjective: Received voicemail message from [facility], states she would like to give this RNCM an update on her conversation with primary MD, some other things she would like to discuss, and requested call back. Telephone call to patient's home / mobile number, spoke with patient, and HIPAA verified.  Discussed [facility] Care Management [facility] Consult  follow up, patient voiced understanding, and is in agreement to follow up.  Patient states she remembers speaking with this RNCM in the past, wanted let RNCM know that she followed up on everything that was request of her to obtain her desired level of care,  and gave the following verbal updates.    States she met with her new primary MD (Dr. Delfina Redwood) discussed her questions, concerns, desired patient care outcomes, felt MD was rude, not meeting her care needs, did not listen to her concerns,  and she will decide how to address this issue or if she will address,  in the future if needed.  Patient states she has spoken with practice administrator at primary MD's office in the past and is planning to change providers in the future.  States she is in the process of researching to find a new primary care provider,  she is looking into [employer], asking co worker / friends for preferred physicians, and may also follow up with [employer] customer service to verify in-network primary care providers. States she has also referred herself to additional  providers as needed,   States she was referred to sleep specialist Carlos Levering Physician Assistant), last visit today, was diagnosed with severe sleep apnea and has been referred to [facility] for CPAP.  States the order for CPAP will be sent to [facility] today and someone will call her to set up machine fitting/ pick up.   States she was recently diagnosed with hernias and is being followed by  Dr. Alessandra Bevels ( gastroenterologist).   States she has been referred pain management clinic and has her initial appointment on 05/18/18, has been unable to obtain refill pain medicine, and is using other medicines to manage pain until she can follow up with pain management MD.  States primary MD and orthopedic MD have both declined to refill pain medications at this time.  Patient states she is still planning to attend her bariatric surgery evaluation appointment in [place] on 04/16/18.  States she planning to discuss her new diagnosis of hiatal hernia, and sleep apnea with bariatric surgeon during 04/16/18 visit.  States she will follow up with bariatric surgeon's office to  facilitate medical record release and to give consent as needed for provider to obtain medical records of her recent diagnoses.  RNCM encouraged patient to continue to advocate for her desired patient care outcomes.   Patient states she does not have any education material, transition of care, care coordination, disease management, disease monitoring, transportation, community resource, or pharmacy needs at this time.  States she is very appreciative of the follow up and is in agreement to receive [place] Management information.  Secure email update regarding above conversation sent to [facility] @ [address].      Per K[employer] ([employer], point of care tool) and chart review,patient has has no recent hospitalizations. Patient had the following ED visits since last patient encounter with this RNCM: on 03/23/18 for Abdominal pain and on 03/17/18 for nausea, chronic anemia, hiatal hernia.  Had ED visit on 10/22/27 for chronic low back pain, ED provider recommended pain management specialist and weight loss.  Had ED visit on 12/24/17 for abscess of skin abdomen with incision and drainage of site in ED.  Had ED visit on 01/13/18 for rash abdomen, intertrigo, candidiasis.   Had ED visit on 01/22/18 for Lumbosacral radiculopathy,  acute on chronic back pain.   Had ED visit on 10815/19 for Exacerbation of chronic back pain with right-sided sciatica at [facility] to  [facility] ED due to needing an urgent MRI, not available at this facility.   Patient had ED visit 10/08/17  for right wrist pain. Update from [employer]:46 yo HR F noted to have 8+ ED visits to [facility] ED, most recent as follows: 06/29/17 Dx- Lumbago with Sciatica ; 04/13/17 Dx- Rash and non-specific skin eruption; 01/20/17 Dx- Pelvic and Perineal Pain; 12/24/17 Dx- Strain of muscle PMH: Epilepsy, Low Back Pain, Asthma, Osteoarthritis, Obesity, Nicotine Dependence; Elective admission 04/07/17 -04/08/17 for rotator cuff repair. Member did not respond to outreach from [place] prior to June of 2019.  [facility] Care Management completed [facility] Consult follow up on 03/08/18.         Assessment:Received [facility] Consult referral from  patient on 04/05/18.  [facility] Consult follow up completed, no care management needs, and case will remain closed.        Plan:RNCM has sent patient successful outreach letter, [facility] pamphlet, and magnet. RNCM will complete case closure due to follow up completed / no care management needs.           Alyssha Housh H. Annia Friendly, BSN, [place] Management [facility] Telephonic CM Phone: [phone] Fax: [phone]

## 2018-04-12 ENCOUNTER — Other Ambulatory Visit: Payer: Self-pay | Admitting: Gastroenterology

## 2018-04-16 DIAGNOSIS — E7800 Pure hypercholesterolemia, unspecified: Secondary | ICD-10-CM | POA: Diagnosis not present

## 2018-04-16 DIAGNOSIS — E878 Other disorders of electrolyte and fluid balance, not elsewhere classified: Secondary | ICD-10-CM | POA: Diagnosis not present

## 2018-04-16 DIAGNOSIS — K7689 Other specified diseases of liver: Secondary | ICD-10-CM | POA: Diagnosis not present

## 2018-04-16 DIAGNOSIS — K269 Duodenal ulcer, unspecified as acute or chronic, without hemorrhage or perforation: Secondary | ICD-10-CM | POA: Diagnosis not present

## 2018-04-16 DIAGNOSIS — R5381 Other malaise: Secondary | ICD-10-CM | POA: Diagnosis not present

## 2018-04-16 DIAGNOSIS — I809 Phlebitis and thrombophlebitis of unspecified site: Secondary | ICD-10-CM | POA: Diagnosis not present

## 2018-04-16 DIAGNOSIS — E119 Type 2 diabetes mellitus without complications: Secondary | ICD-10-CM | POA: Diagnosis not present

## 2018-04-16 DIAGNOSIS — D509 Iron deficiency anemia, unspecified: Secondary | ICD-10-CM | POA: Diagnosis not present

## 2018-04-16 DIAGNOSIS — E039 Hypothyroidism, unspecified: Secondary | ICD-10-CM | POA: Diagnosis not present

## 2018-04-26 ENCOUNTER — Other Ambulatory Visit: Payer: Self-pay | Admitting: Internal Medicine

## 2018-04-26 DIAGNOSIS — Z1231 Encounter for screening mammogram for malignant neoplasm of breast: Secondary | ICD-10-CM

## 2018-04-29 ENCOUNTER — Other Ambulatory Visit: Payer: Self-pay | Admitting: Neurological Surgery

## 2018-04-29 DIAGNOSIS — M545 Low back pain, unspecified: Secondary | ICD-10-CM

## 2018-04-29 DIAGNOSIS — G8929 Other chronic pain: Secondary | ICD-10-CM

## 2018-05-05 DIAGNOSIS — G4733 Obstructive sleep apnea (adult) (pediatric): Secondary | ICD-10-CM | POA: Diagnosis not present

## 2018-05-05 DIAGNOSIS — D649 Anemia, unspecified: Secondary | ICD-10-CM | POA: Diagnosis not present

## 2018-05-05 DIAGNOSIS — R0989 Other specified symptoms and signs involving the circulatory and respiratory systems: Secondary | ICD-10-CM | POA: Diagnosis not present

## 2018-05-05 DIAGNOSIS — R933 Abnormal findings on diagnostic imaging of other parts of digestive tract: Secondary | ICD-10-CM | POA: Diagnosis not present

## 2018-05-05 DIAGNOSIS — R269 Unspecified abnormalities of gait and mobility: Secondary | ICD-10-CM | POA: Diagnosis not present

## 2018-05-05 DIAGNOSIS — R1084 Generalized abdominal pain: Secondary | ICD-10-CM | POA: Diagnosis not present

## 2018-05-06 DIAGNOSIS — M549 Dorsalgia, unspecified: Secondary | ICD-10-CM | POA: Diagnosis not present

## 2018-05-06 DIAGNOSIS — G8929 Other chronic pain: Secondary | ICD-10-CM | POA: Diagnosis not present

## 2018-05-06 DIAGNOSIS — G40909 Epilepsy, unspecified, not intractable, without status epilepticus: Secondary | ICD-10-CM | POA: Diagnosis not present

## 2018-05-06 DIAGNOSIS — G4733 Obstructive sleep apnea (adult) (pediatric): Secondary | ICD-10-CM | POA: Diagnosis not present

## 2018-05-06 DIAGNOSIS — E785 Hyperlipidemia, unspecified: Secondary | ICD-10-CM | POA: Diagnosis not present

## 2018-05-06 DIAGNOSIS — Z6841 Body Mass Index (BMI) 40.0 and over, adult: Secondary | ICD-10-CM | POA: Diagnosis not present

## 2018-05-07 ENCOUNTER — Ambulatory Visit
Admission: RE | Admit: 2018-05-07 | Discharge: 2018-05-07 | Disposition: A | Discharge status: Home / Self Care | Payer: 59 | Source: Ambulatory Visit | Attending: Neurological Surgery | Admitting: Neurological Surgery

## 2018-05-07 DIAGNOSIS — M545 Low back pain: Secondary | ICD-10-CM | POA: Diagnosis not present

## 2018-05-07 DIAGNOSIS — G8929 Other chronic pain: Secondary | ICD-10-CM

## 2018-05-07 MED ORDER — METHYLPREDNISOLONE ACETATE 40 MG/ML INJ SUSP (RADIOLOG
120.0000 mg | Freq: Once | INTRAMUSCULAR | Status: AC
  Administered 2018-05-07: 120 mg via EPIDURAL

## 2018-05-07 MED ORDER — IOPAMIDOL (ISOVUE-M 200) INJECTION 41%
1.0000 mL | Freq: Once | INTRAMUSCULAR | Status: AC
  Administered 2018-05-07: 1 mL via EPIDURAL

## 2018-05-07 NOTE — Discharge Instructions (Signed)

## 2018-05-10 DIAGNOSIS — R7303 Prediabetes: Secondary | ICD-10-CM | POA: Insufficient documentation

## 2018-05-14 ENCOUNTER — Other Ambulatory Visit: Payer: Self-pay

## 2018-05-14 ENCOUNTER — Ambulatory Visit (HOSPITAL_COMMUNITY)
Admission: RE | Admit: 2018-05-14 | Discharge: 2018-05-14 | Disposition: A | Discharge status: Home / Self Care | Payer: 59 | Source: Ambulatory Visit | Attending: Gastroenterology | Admitting: Gastroenterology

## 2018-05-14 ENCOUNTER — Encounter (HOSPITAL_COMMUNITY)
Admission: RE | Disposition: A | Discharge status: Home / Self Care | Payer: Self-pay | Source: Ambulatory Visit | Attending: Gastroenterology

## 2018-05-14 ENCOUNTER — Ambulatory Visit (HOSPITAL_COMMUNITY): Payer: 59 | Admitting: Certified Registered"

## 2018-05-14 ENCOUNTER — Encounter (HOSPITAL_COMMUNITY): Payer: Self-pay | Admitting: *Deleted

## 2018-05-14 DIAGNOSIS — J45909 Unspecified asthma, uncomplicated: Secondary | ICD-10-CM | POA: Diagnosis not present

## 2018-05-14 DIAGNOSIS — Z87891 Personal history of nicotine dependence: Secondary | ICD-10-CM | POA: Insufficient documentation

## 2018-05-14 DIAGNOSIS — K317 Polyp of stomach and duodenum: Secondary | ICD-10-CM | POA: Diagnosis not present

## 2018-05-14 DIAGNOSIS — R569 Unspecified convulsions: Secondary | ICD-10-CM | POA: Insufficient documentation

## 2018-05-14 DIAGNOSIS — K2950 Unspecified chronic gastritis without bleeding: Secondary | ICD-10-CM | POA: Diagnosis not present

## 2018-05-14 DIAGNOSIS — R933 Abnormal findings on diagnostic imaging of other parts of digestive tract: Secondary | ICD-10-CM | POA: Diagnosis not present

## 2018-05-14 HISTORY — PX: BIOPSY: SHX5522

## 2018-05-14 HISTORY — PX: ESOPHAGOGASTRODUODENOSCOPY (EGD) WITH PROPOFOL: SHX5813

## 2018-05-14 SURGERY — ESOPHAGOGASTRODUODENOSCOPY (EGD) WITH PROPOFOL
Anesthesia: Monitor Anesthesia Care

## 2018-05-14 MED ORDER — SODIUM CHLORIDE 0.9 % IV SOLN: INTRAVENOUS | Status: DC

## 2018-05-14 MED ORDER — PROPOFOL 10 MG/ML IV BOLUS
INTRAVENOUS | Status: DC | PRN
  Administered 2018-05-14: 20 mg via INTRAVENOUS
  Administered 2018-05-14: 30 mg via INTRAVENOUS

## 2018-05-14 MED ORDER — LIDOCAINE 2% (20 MG/ML) 5 ML SYRINGE
INTRAMUSCULAR | Status: DC | PRN
  Administered 2018-05-14: 80 mg via INTRAVENOUS

## 2018-05-14 MED ORDER — LACTATED RINGERS IV SOLN
INTRAVENOUS | Status: DC | PRN
  Administered 2018-05-14: 10:00:00 via INTRAVENOUS

## 2018-05-14 MED ORDER — PROPOFOL 10 MG/ML IV BOLUS
INTRAVENOUS | Status: AC
  Filled 2018-05-14: qty 40

## 2018-05-14 MED ORDER — PROPOFOL 500 MG/50ML IV EMUL
INTRAVENOUS | Status: DC | PRN
  Administered 2018-05-14: 125 ug/kg/min via INTRAVENOUS

## 2018-05-14 SURGICAL SUPPLY — 15 items

## 2018-05-14 NOTE — Anesthesia Postprocedure Evaluation (Signed)
Anesthesia Post Note  Patient: Misty Foster  Procedure(s) Performed: ESOPHAGOGASTRODUODENOSCOPY (EGD) WITH PROPOFOL (N/A ) BIOPSY     Patient location during evaluation: Endoscopy Anesthesia Type: MAC Level of consciousness: awake and alert Pain management: pain level controlled Vital Signs Assessment: post-procedure vital signs reviewed and stable Respiratory status: spontaneous breathing, nonlabored ventilation, respiratory function stable and patient connected to nasal cannula oxygen Cardiovascular status: stable and blood pressure returned to baseline Postop Assessment: no apparent nausea or vomiting Anesthetic complications: no    Last Vitals:  Vitals:   05/14/18 1110 05/14/18 1120  BP: 127/90 (!) 139/92  Pulse: 83 79  Resp: 18 16  Temp:    SpO2: 98% 100%    Last Pain:  Vitals:   05/14/18 1120  TempSrc:   PainSc: 0-No pain                 Montez Hageman

## 2018-05-14 NOTE — Discharge Instructions (Signed)

## 2018-05-14 NOTE — H&P (Signed)
Misty Foster is a 48 y.o. female has presented for EGD for abnormal upper GI series concerning for possible polyp in the gastric body.  The various methods of treatment have been discussed with the patient and family. After consideration of risks, benefits and other options for treatment, the patient has consented to  Procedure(s): Esophago-duodenoscopy as a surgical intervention .  The patient's history has been reviewed, patient examined, no change in status, stable for surgery.  I have reviewed the patient's chart and labs.  Questions were answered to the patient's satisfaction.   Risks (bleeding, infection, bowel perforation that could require surgery, sedation-related changes in cardiopulmonary systems), benefits (identification and possible treatment of source of symptoms, exclusion of certain causes of symptoms), and alternatives (watchful waiting, radiographic imaging studies, empiric medical treatment)  were explained to patient/family in detail and patient wishes to proceed.  Kathi Der MD, FACP 05/14/2018, 9:51 AM  Contact #  [phone]

## 2018-05-14 NOTE — Anesthesia Preprocedure Evaluation (Signed)
Anesthesia Evaluation  Patient identified by MRN, date of birth, ID band Patient awake    Reviewed: Allergy & Precautions, NPO status , Patient's Chart, lab work & pertinent test results  Airway Mallampati: II  TM Distance: >3 FB Neck ROM: Full    Dental no notable dental hx.    Pulmonary asthma , former smoker,    Pulmonary exam normal breath sounds clear to auscultation       Cardiovascular negative cardio ROS Normal cardiovascular exam Rhythm:Regular Rate:Normal     Neuro/Psych Seizures -, Well Controlled,  negative psych ROS   GI/Hepatic negative GI ROS, Neg liver ROS,   Endo/Other  negative endocrine ROS  Renal/GU negative Renal ROS  negative genitourinary   Musculoskeletal negative musculoskeletal ROS (+)   Abdominal   Peds negative pediatric ROS (+)  Hematology negative hematology ROS (+)   Anesthesia Other Findings Tuberous sclerosis  Reproductive/Obstetrics negative OB ROS                             Anesthesia Physical Anesthesia Plan  ASA: II  Anesthesia Plan: MAC   Post-op Pain Management:    Induction: Intravenous  PONV Risk Score and Plan: 2 and Treatment may vary due to age or medical condition  Airway Management Planned: Nasal Cannula  Additional Equipment:   Intra-op Plan:   Post-operative Plan:   Informed Consent: I have reviewed the patients History and Physical, chart, labs and discussed the procedure including the risks, benefits and alternatives for the proposed anesthesia with the patient or authorized representative who has indicated his/her understanding and acceptance.     Dental advisory given  Plan Discussed with: CRNA  Anesthesia Plan Comments:         Anesthesia Quick Evaluation

## 2018-05-14 NOTE — Op Note (Signed)
[facility] Name: Misty BlendCatrice Kruckenberg Procedure Date: 05/14/2018 MRN: [id number] Attending MD: Kathi DerParag Marise Knapper , MD Date of Birth: [birth year] CSN: [id number] Age: 48 Admit Type: Outpatient Procedure:                Upper GI endoscopy Indications:              Abnormal UGI series (Small rounded defect within                            the distal antrum of the stomach ) Providers:                Kathi DerParag Malayia Spizzirri, MD, Dwain SarnaPatricia Ford, RN, Janie                            [place], Technician, [facility] Dee Williford, CRNA Referring MD:              Medicines:                 Complications:            No immediate complications. Estimated Blood Loss:     Estimated blood loss was minimal. Procedure:                Pre-Anesthesia Assessment:                           - Prior to the procedure, a History and Physical                            was performed, and patient medications and                            allergies were reviewed. The patient's tolerance of                            previous anesthesia was also reviewed. The risks                            and benefits of the procedure and the sedation                            options and risks were discussed with the patient.                            All questions were answered, and informed consent                            was obtained. Prior Anticoagulants: The patient has                            taken no previous anticoagulant or antiplatelet                            agents. ASA Grade Assessment: II - A patient with  mild systemic disease. After reviewing the risks                            and benefits, the patient was deemed in                            satisfactory condition to undergo the procedure.                           After obtaining informed consent, the endoscope was                            passed under direct vision. Throughout the   procedure, the patient's blood pressure, pulse, and                            oxygen saturations were monitored continuously. The                            GIF-H190 ([id number] Olympus gastroscope was                            introduced through the mouth, and advanced to the                            second part of duodenum. The upper GI endoscopy was                            accomplished without difficulty. The patient                            tolerated the procedure well. Scope In: Scope Out: Findings:      The Z-line was regular and was found 40 cm from the incisors.      The exam of the esophagus was otherwise normal.      There is no endoscopic evidence of ulceration or polyps in the gastric       antrum.      Two diminutive sessile polyps were found in the gastric body. These       polyps were removed with a cold biopsy forceps. Resection and retrieval       were complete.      Normal mucosa was found in the entire examined stomach. Biopsies were       taken with a cold forceps for histology.      The cardia and gastric fundus were normal on retroflexion.      The duodenal bulb, first portion of the duodenum and second portion of       the duodenum were normal. Impression:               - Z-line regular, 40 cm from the incisors.                           - Two gastric polyps. Resected and retrieved.                           -  Normal mucosa was found in the entire stomach.                            Biopsied.                           - Normal duodenal bulb, first portion of the                            duodenum and second portion of the duodenum. Moderate Sedation:      Moderate (conscious) sedation was personally administered by an       anesthesia professional. The following parameters were monitored: oxygen       saturation, heart rate, blood pressure, and response to care. Recommendation:           - Patient has a contact number available for                             emergencies. The signs and symptoms of potential                            delayed complications were discussed with the                            patient. Return to normal activities tomorrow.                            Written discharge instructions were provided to the                            patient.                           - Resume previous diet.                           - Continue present medications.                           - Await pathology results.                           - Return to my office as previously scheduled. Procedure Code(s):        --- Professional ---                           [phone], Esophagogastroduodenoscopy, flexible,                            transoral; with biopsy, single or multiple Diagnosis Code(s):        --- Professional ---                           K31.7, Polyp of stomach and duodenum                           R93.3, Abnormal  findings on diagnostic imaging of                            other parts of digestive tract CPT copyright 2018 American [employer] Association. All rights reserved. The codes documented in this report are preliminary and upon coder review may  be revised to meet current compliance requirements. Kathi Der, MD Kathi Der, MD 05/14/2018 10:59:22 AM Number of Addenda: 0

## 2018-05-14 NOTE — Transfer of Care (Signed)
Immediate Anesthesia Transfer of Care Note  Patient: Misty Foster  Procedure(s) Performed: ESOPHAGOGASTRODUODENOSCOPY (EGD) WITH PROPOFOL (N/A ) BIOPSY  Patient Location: PACU  Anesthesia Type:MAC  Level of Consciousness: awake, alert  and oriented  Airway & Oxygen Therapy: Patient Spontanous Breathing and Patient connected to nasal cannula oxygen  Post-op Assessment: Report given to RN and Post -op Vital signs reviewed and stable  Post vital signs: Reviewed and stable  Last Vitals:  Vitals Value Taken Time  BP    Temp    Pulse    Resp    SpO2      Last Pain:  Vitals:   05/14/18 0938  TempSrc: Oral  PainSc: 0-No pain         Complications: No apparent anesthesia complications

## 2018-05-17 ENCOUNTER — Encounter (HOSPITAL_COMMUNITY): Payer: Self-pay | Admitting: Gastroenterology

## 2018-05-27 ENCOUNTER — Ambulatory Visit
Admission: RE | Admit: 2018-05-27 | Discharge: 2018-05-27 | Disposition: A | Discharge status: Home / Self Care | Payer: 59 | Source: Ambulatory Visit | Attending: Internal Medicine | Admitting: Internal Medicine

## 2018-05-27 DIAGNOSIS — Z1231 Encounter for screening mammogram for malignant neoplasm of breast: Secondary | ICD-10-CM

## 2018-05-28 ENCOUNTER — Encounter (HOSPITAL_BASED_OUTPATIENT_CLINIC_OR_DEPARTMENT_OTHER): Payer: Self-pay

## 2018-05-28 ENCOUNTER — Other Ambulatory Visit: Payer: Self-pay

## 2018-05-28 ENCOUNTER — Emergency Department (HOSPITAL_BASED_OUTPATIENT_CLINIC_OR_DEPARTMENT_OTHER)
Admission: EM | Admit: 2018-05-28 | Discharge: 2018-05-28 | Disposition: A | Discharge status: Home / Self Care | Payer: 59 | Attending: Emergency Medicine | Admitting: Emergency Medicine

## 2018-05-28 DIAGNOSIS — Z79899 Other long term (current) drug therapy: Secondary | ICD-10-CM | POA: Diagnosis not present

## 2018-05-28 DIAGNOSIS — Z87891 Personal history of nicotine dependence: Secondary | ICD-10-CM | POA: Diagnosis not present

## 2018-05-28 DIAGNOSIS — J45909 Unspecified asthma, uncomplicated: Secondary | ICD-10-CM | POA: Insufficient documentation

## 2018-05-28 DIAGNOSIS — M546 Pain in thoracic spine: Secondary | ICD-10-CM | POA: Diagnosis not present

## 2018-05-28 MED ORDER — KETOROLAC TROMETHAMINE 60 MG/2ML IM SOLN
60.0000 mg | Freq: Once | INTRAMUSCULAR | Status: AC
  Administered 2018-05-28: 60 mg via INTRAMUSCULAR
  Filled 2018-05-28: qty 2

## 2018-05-28 MED ORDER — OXYCODONE-ACETAMINOPHEN 5-325 MG PO TABS: 1.0000 | ORAL_TABLET | ORAL | 0 refills | Status: DC | PRN

## 2018-05-28 MED ORDER — ONDANSETRON 8 MG PO TBDP
8.0000 mg | ORAL_TABLET | Freq: Once | ORAL | Status: AC
  Administered 2018-05-28: 8 mg via ORAL
  Filled 2018-05-28: qty 1

## 2018-05-28 MED ORDER — HYDROMORPHONE HCL 1 MG/ML IJ SOLN
2.0000 mg | Freq: Once | INTRAMUSCULAR | Status: AC
  Administered 2018-05-28: 2 mg via INTRAMUSCULAR
  Filled 2018-05-28: qty 2

## 2018-05-28 NOTE — ED Provider Notes (Signed)
[facility] EMERGENCY DEPARTMENT Provider Note   CSN: [id number] Arrival date & time: 05/28/18  0023     History   Chief Complaint Chief Complaint  Patient presents with  . Back Pain    HPI Misty Foster is a 48 y.o. female.  Patient presents to the emergency department for evaluation of back pain.  Patient has a history of chronic back pain.  She reports that she normally has pain in the lower back.  Over the last couple of days, however, she has been experiencing pain in the mid back.  It is bilateral but worse on the right, feels like a spasm.  She denies any injury.  No urinary symptoms.  No weakness of the legs.  No numbness, tingling of the lower extremities.     Past Medical History:  Diagnosis Date  . Arthritis   . Asthma   . Back pain   . Chronic back pain   . Incomplete rotator cuff tear or rupture of left shoulder, not specified as traumatic 04/07/2017  . Seizures (HCC)   . 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 Incomplete rotator cuff tear or rupture of left shoulder, not specified as traumatic 04/07/2017  . Left rotator cuff tear 04/07/2017  . Left shoulder pain 01/01/2017  . Pain in right hip 11/10/2016  . Chronic right-sided low back pain without sciatica 11/10/2016  . 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 Neuropathy 06/20/2016  . Chronic back pain 05/01/2016  . Migraine without aura and without status migrainosus, not intractable 02/11/2015  . 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 ABLATION     cervical  . BIOPSY  05/14/2018   Procedure: BIOPSY;  Surgeon: Kathi Der, MD;  Location: [facility];  Service: Gastroenterology;;  . CESAREAN SECTION     x3  . ESOPHAGOGASTRODUODENOSCOPY (EGD) WITH PROPOFOL  N/A 05/14/2018   Procedure: ESOPHAGOGASTRODUODENOSCOPY (EGD) WITH PROPOFOL;  Surgeon: Kathi Der, MD;  Location: [facility];  Service: Gastroenterology;  Laterality: N/A;  . SHOULDER ARTHROSCOPY WITH ROTATOR CUFF REPAIR Left 04/07/2017   Procedure: SHOULDER ARTHROSCOPY WITH ROTATOR CUFF REPAIR, ACROMIOPLASTY AND EXTENSIVE DEBRIDEMENT;  Surgeon: Teryl Lucy, MD;  Location: [facility] OR;  Service: Orthopedics;  Laterality: Left;     OB History    Gravida  4   Para  3   Term      Preterm      AB  1   Living        SAB      TAB      Ectopic      Multiple      Live Births               Home Medications    Prior to Admission medications   Medication Sig Start Date End Date Taking? Authorizing Provider  albuterol (VENTOLIN HFA) 108 (90 Base) MCG/ACT inhaler Inhale 2 puffs into the lungs every 6 (six) hours as needed for wheezing or shortness of breath.  09/13/15   [provider]  Ascorbic Acid (VITAMIN C) 1000 MG tablet Take 1,000 mg by mouth daily.    [provider]  baclofen (LIORESAL) 20 MG tablet Take 20-40 mg by mouth daily as needed for pain. 01/06/18   [provider]  cyclobenzaprine (FLEXERIL) 10 MG tablet  Take 10 mg by mouth at bedtime.    [provider]  dicyclomine (BENTYL) 20 MG tablet Take 1 tablet (20 mg total) by mouth 3 (three) times daily before meals for 5 days. 03/17/18 03/22/18  Shaune PollackIsaacs, Cameron, MD  ibuprofen (ADVIL,MOTRIN) 200 MG tablet Take 600-800 mg by mouth every 6 (six) hours as needed for moderate pain.    [provider]  Multiple Vitamins-Minerals (MULTIVITAMIN WITH MINERALS) tablet Take 1 tablet by mouth daily.    [provider]  ondansetron (ZOFRAN ODT) 4 MG disintegrating tablet Take 1 tablet (4 mg total) by mouth every 8 (eight) hours as needed for nausea or vomiting. 03/17/18   Shaune PollackIsaacs, Cameron, MD  oxyCODONE-acetaminophen (PERCOCET) 5-325 MG tablet Take 1-2 tablets by mouth every 4  (four) hours as needed. 05/28/18   Gilda CreasePollina, Merin Borjon J, MD  pantoprazole (PROTONIX) 40 MG tablet Take 40 mg by mouth daily.    [provider]  PHENobarbital (LUMINAL) 97.2 MG tablet Take 1 and 1 half tablets by mouth each evening 03/05/18   Van ClinesAquino, Karen M, MD  phentermine 37.5 MG capsule Take 37.5 mg by mouth every morning.    [provider]    Family History Family History  Problem Relation Age of Onset  . Breast cancer Maternal Grandmother   . Alcohol abuse Father   . Lung disease Father   . Epilepsy Father     Social History Social History   Tobacco Use  . Smoking status: Former Smoker    Years: 2.00    Types: Cigarettes    Last attempt to quit: 10/09/2017    Years since quitting: 0.6  . Smokeless tobacco: Never Used  Substance Use Topics  . Alcohol use: No  . Drug use: No     Allergies   Lamictal [lamotrigine]; Lortab [hydrocodone-acetaminophen]; and Gabapentin   Review of Systems Review of Systems  Musculoskeletal: Positive for back pain.  All other systems reviewed and are negative.    Physical Exam Updated Vital Signs BP (!) 132/94   Pulse 82   Temp (!) 97.5 F (36.4 C) (Oral)   Resp 20   Ht 5\' 4"  (1.626 m)   Wt (!) 165 kg   SpO2 100%   BMI 62.44 kg/m   Physical Exam Vitals signs and nursing note reviewed.  Constitutional:      General: She is not in acute distress.    Appearance: Normal appearance. She is well-developed.  HENT:     Head: Normocephalic and atraumatic.     Right Ear: Hearing normal.     Left Ear: Hearing normal.     Nose: Nose normal.  Eyes:     Conjunctiva/sclera: Conjunctivae normal.     Pupils: Pupils are equal, round, and reactive to light.  Neck:     Musculoskeletal: Normal range of motion and neck supple.  Cardiovascular:     Rate and Rhythm: Regular rhythm.     Heart sounds: S1 normal and S2 normal. No murmur. No friction rub. No gallop.   Pulmonary:     Effort: Pulmonary effort is normal. No  respiratory distress.     Breath sounds: Normal breath sounds.  Chest:     Chest wall: No tenderness.  Abdominal:     General: Bowel sounds are normal.     Palpations: Abdomen is soft.     Tenderness: There is no abdominal tenderness. There is no guarding or rebound. Negative signs include Murphy's sign and McBurney's sign.  Hernia: No hernia is present.  Musculoskeletal: Normal range of motion.     Thoracic back: She exhibits tenderness.       Back:  Skin:    General: Skin is warm and dry.     Findings: No rash.  Neurological:     Mental Status: She is alert and oriented to person, place, and time.     GCS: GCS eye subscore is 4. GCS verbal subscore is 5. GCS motor subscore is 6.     Cranial Nerves: No cranial nerve deficit.     Sensory: No sensory deficit.     Coordination: Coordination normal.  Psychiatric:        Speech: Speech normal.        Behavior: Behavior normal.        Thought Content: Thought content normal.      ED Treatments / Results  Labs (all labs ordered are listed, but only abnormal results are displayed) Labs Reviewed - No data to display  EKG None  Radiology Mm Digital Screening Bilateral  Result Date: 05/27/2018 CLINICAL DATA:  Screening. EXAM: DIGITAL SCREENING BILATERAL MAMMOGRAM WITH CAD COMPARISON:  Previous exam(s). ACR Breast Density Category b: There are scattered areas of fibroglandular density. FINDINGS: There are no findings suspicious for malignancy. Images were processed with CAD. IMPRESSION: No mammographic evidence of malignancy. A result letter of this screening mammogram will be mailed directly to the patient. RECOMMENDATION: Screening mammogram in one year. (Code:SM-B-01Y) BI-RADS CATEGORY  1: Negative. Electronically Signed   By: Frederico HammanMichelle  Collins M.D.   On: 05/27/2018 09:05    Procedures Procedures (including critical care time)  Medications Ordered in ED Medications  HYDROmorphone (DILAUDID) injection 2 mg (2 mg  Intramuscular Given 05/28/18 0119)  ondansetron (ZOFRAN-ODT) disintegrating tablet 8 mg (8 mg Oral Given 05/28/18 0117)  ketorolac (TORADOL) injection 60 mg (60 mg Intramuscular Given 05/28/18 0119)     Initial Impression / Assessment and Plan / ED Course  I have reviewed the triage vital signs and the nursing notes.  Pertinent labs & imaging results that were available during my care of the patient were reviewed by me and considered in my medical decision making (see chart for details).     Patient presents to the ER with musculoskeletal back pain. Examination reveals back tenderness without any associated neurologic findings. Patient's strength, sensation and reflexes were normal. There is no evidence of saddle anesthesia. Patient does not have a foot drop. Patient has not experienced any change in bowel or bladder function. As such, patient did not require any imaging or further studies. Patient was treated with analgesia.  He has had significant improvement.  She can continue the prescribed Mobic and baclofen, will add short course of Percocet.  Follow-up with her back doctor, Dr. Maurice Smallstergard.  Final Clinical Impressions(s) / ED Diagnoses   Final diagnoses:  Acute bilateral thoracic back pain    ED Discharge Orders         Ordered    oxyCODONE-acetaminophen (PERCOCET) 5-325 MG tablet  Every 4 hours PRN     05/28/18 0206           Gilda CreasePollina, Denicia Pagliarulo J, MD 05/28/18 0206

## 2018-05-28 NOTE — ED Triage Notes (Signed)
Pt c/o acute on chronic back pain. Pt has had back pain for "years" pain has been increasing x 2 days. Pt states pain from mid back that radiates down to hips.

## 2018-06-04 HISTORY — PX: GASTRIC BYPASS: SHX52

## 2018-08-06 ENCOUNTER — Other Ambulatory Visit: Payer: Self-pay

## 2018-08-06 ENCOUNTER — Emergency Department (HOSPITAL_BASED_OUTPATIENT_CLINIC_OR_DEPARTMENT_OTHER): Payer: 59

## 2018-08-06 ENCOUNTER — Emergency Department (HOSPITAL_BASED_OUTPATIENT_CLINIC_OR_DEPARTMENT_OTHER)
Admission: EM | Admit: 2018-08-06 | Discharge: 2018-08-06 | Disposition: A | Discharge status: Home / Self Care | Payer: 59 | Attending: Emergency Medicine | Admitting: Emergency Medicine

## 2018-08-06 ENCOUNTER — Encounter (HOSPITAL_BASED_OUTPATIENT_CLINIC_OR_DEPARTMENT_OTHER): Payer: Self-pay | Admitting: Emergency Medicine

## 2018-08-06 DIAGNOSIS — Z113 Encounter for screening for infections with a predominantly sexual mode of transmission: Secondary | ICD-10-CM | POA: Insufficient documentation

## 2018-08-06 DIAGNOSIS — Z87891 Personal history of nicotine dependence: Secondary | ICD-10-CM | POA: Diagnosis not present

## 2018-08-06 DIAGNOSIS — Z79899 Other long term (current) drug therapy: Secondary | ICD-10-CM | POA: Diagnosis not present

## 2018-08-06 DIAGNOSIS — R109 Unspecified abdominal pain: Secondary | ICD-10-CM | POA: Diagnosis present

## 2018-08-06 DIAGNOSIS — K429 Umbilical hernia without obstruction or gangrene: Secondary | ICD-10-CM | POA: Diagnosis not present

## 2018-08-06 LAB — URINALYSIS, ROUTINE W REFLEX MICROSCOPIC
Bilirubin Urine: NEGATIVE
Glucose, UA: NEGATIVE mg/dL
Hgb urine dipstick: NEGATIVE
Ketones, ur: NEGATIVE mg/dL
Leukocytes,Ua: NEGATIVE
Nitrite: NEGATIVE
Protein, ur: NEGATIVE mg/dL
Specific Gravity, Urine: <1.005 — ABNORMAL LOW (ref 1.005–1.030)
pH: 6 (ref 5.0–8.0)

## 2018-08-06 LAB — CBC WITH DIFFERENTIAL/PLATELET
Abs Immature Granulocytes: 0 10*3/uL (ref 0.00–0.07)
Basophils Absolute: 0 10*3/uL (ref 0.0–0.1)
Basophils Relative: 0 %
Eosinophils Absolute: 0.1 10*3/uL (ref 0.0–0.5)
Eosinophils Relative: 3 %
HCT: 36 % (ref 36.0–46.0)
Hemoglobin: 11.5 g/dL — ABNORMAL LOW (ref 12.0–15.0)
Immature Granulocytes: 0 %
Lymphocytes Relative: 35 %
Lymphs Abs: 1.6 10*3/uL (ref 0.7–4.0)
MCH: 30.3 pg (ref 26.0–34.0)
MCHC: 31.9 g/dL (ref 30.0–36.0)
MCV: 95 fL (ref 80.0–100.0)
Monocytes Absolute: 0.3 10*3/uL (ref 0.1–1.0)
Monocytes Relative: 7 %
Neutro Abs: 2.5 10*3/uL (ref 1.7–7.7)
Neutrophils Relative %: 55 %
Platelets: 166 10*3/uL (ref 150–400)
RBC: 3.79 MIL/uL — ABNORMAL LOW (ref 3.87–5.11)
RDW: 13.3 % (ref 11.5–15.5)
WBC: 4.5 10*3/uL (ref 4.0–10.5)
nRBC: 0 % (ref 0.0–0.2)

## 2018-08-06 LAB — COMPREHENSIVE METABOLIC PANEL
ALT: 17 U/L (ref 0–44)
AST: 19 U/L (ref 15–41)
Albumin: 3.4 g/dL — ABNORMAL LOW (ref 3.5–5.0)
Alkaline Phosphatase: 83 U/L (ref 38–126)
Anion gap: 6 (ref 5–15)
BUN: 11 mg/dL (ref 6–20)
CO2: 25 mmol/L (ref 22–32)
Calcium: 8.9 mg/dL (ref 8.9–10.3)
Chloride: 106 mmol/L (ref 98–111)
Creatinine, Ser: 0.61 mg/dL (ref 0.44–1.00)
GFR calc Af Amer: >60 mL/min (ref >60)
GFR calc non Af Amer: >60 mL/min (ref >60)
Glucose, Bld: 97 mg/dL (ref 70–99)
Potassium: 3.6 mmol/L (ref 3.5–5.1)
Sodium: 137 mmol/L (ref 135–145)
Total Bilirubin: 0.1 mg/dL — ABNORMAL LOW (ref 0.3–1.2)
Total Protein: 7.2 g/dL (ref 6.5–8.1)

## 2018-08-06 LAB — LIPASE, BLOOD: Lipase: 25 U/L (ref 11–51)

## 2018-08-06 MED ORDER — IOHEXOL 350 MG/ML SOLN: 100.0000 mL | Freq: Once | INTRAVENOUS | Status: DC | PRN

## 2018-08-06 MED ORDER — IOHEXOL 300 MG/ML  SOLN
100.0000 mL | Freq: Once | INTRAMUSCULAR | Status: AC | PRN
  Administered 2018-08-06: 100 mL via INTRAVENOUS

## 2018-08-06 MED ORDER — KETOROLAC TROMETHAMINE 30 MG/ML IJ SOLN
30.0000 mg | Freq: Once | INTRAMUSCULAR | Status: AC
  Administered 2018-08-06: 30 mg via INTRAVENOUS
  Filled 2018-08-06: qty 1

## 2018-08-06 NOTE — ED Triage Notes (Signed)
Pt reports mid abd pain after eating yesterday. Pt is s/p bariatric surgery with hernia repair.

## 2018-08-06 NOTE — ED Provider Notes (Signed)
TIME SEEN: 3:50 AM  CHIEF COMPLAINT: abdominal pain  HPI: Patient is a 48 y.o. female with history of laparoscopic Roux-en-Y gastric bypass, laparoscopic hiatal hernia repair, laparoscopic lysis of intra-abdominal adhesions on 06/04/2018 by Dr. Helen HashimotoZorn [facility] [place], previous c section x 3, BTL who presents to ED with abdominal pain.  Describes it as sharp, severe and mostly right-sided.  States that it started tonight while at work.  States it progressively got worse with time.  No fevers, chills, chest pain, shortness of breath, cough, vomiting, diarrhea, dysuria, hematuria, vaginal bleeding or discharge.  She states she is not sure if she ate too much.  States that she ate soup and 5 crackers with tuna before she went to work [employer].  States she felt bloated after eating.  Also reports that she exercise before going to work and did a new abdominal exercise and is not sure if she "overdid it".  ROS: See HPI Constitutional: no fever  Eyes: no drainage  ENT: no runny nose   Cardiovascular:  no chest pain  Resp: no SOB  GI: no vomiting GU: no dysuria Integumentary: no rash  Allergy: no hives  Musculoskeletal: no leg swelling  Neurological: no slurred speech ROS otherwise negative  PAST MEDICAL HISTORY/PAST SURGICAL HISTORY:  Past Medical History:  Diagnosis Date  . Arthritis   . Asthma   . Back pain   . Chronic back pain   . Incomplete rotator cuff tear or rupture of left shoulder, not specified as traumatic 04/07/2017  . Seizures (HCC)   . Tuberous sclerosis (HCC)     MEDICATIONS:  Prior to Admission medications   Medication Sig Start Date End Date Taking? Authorizing Provider  albuterol (VENTOLIN HFA) 108 (90 Base) MCG/ACT inhaler Inhale 2 puffs into the lungs every 6 (six) hours as needed for wheezing or shortness of breath.  09/13/15   [provider]  Ascorbic Acid (VITAMIN C) 1000 MG tablet Take 1,000 mg by mouth daily.    [provider]   baclofen (LIORESAL) 20 MG tablet Take 20-40 mg by mouth daily as needed for pain. 01/06/18   [provider]  cyclobenzaprine (FLEXERIL) 10 MG tablet Take 10 mg by mouth at bedtime.    [provider]  dicyclomine (BENTYL) 20 MG tablet Take 1 tablet (20 mg total) by mouth 3 (three) times daily before meals for 5 days. 03/17/18 03/22/18  Shaune PollackIsaacs, Cameron, MD  ibuprofen (ADVIL,MOTRIN) 200 MG tablet Take 600-800 mg by mouth every 6 (six) hours as needed for moderate pain.    [provider]  Multiple Vitamins-Minerals (MULTIVITAMIN WITH MINERALS) tablet Take 1 tablet by mouth daily.    [provider]  ondansetron (ZOFRAN ODT) 4 MG disintegrating tablet Take 1 tablet (4 mg total) by mouth every 8 (eight) hours as needed for nausea or vomiting. 03/17/18   Shaune PollackIsaacs, Cameron, MD  oxyCODONE-acetaminophen (PERCOCET) 5-325 MG tablet Take 1-2 tablets by mouth every 4 (four) hours as needed. 05/28/18   Gilda CreasePollina, Christopher J, MD  pantoprazole (PROTONIX) 40 MG tablet Take 40 mg by mouth daily.    [provider]  PHENobarbital (LUMINAL) 97.2 MG tablet Take 1 and 1 half tablets by mouth each evening 03/05/18   Van ClinesAquino, Karen M, MD  phentermine 37.5 MG capsule Take 37.5 mg by mouth every morning.    [provider]    ALLERGIES:  Allergies  Allergen Reactions  . Lamictal [Lamotrigine] Nausea And Vomiting and Rash  . Lortab [  Hydrocodone-Acetaminophen] Other (See Comments)    Auras  . Gabapentin Other (See Comments)    auras    SOCIAL HISTORY:  Social History   Tobacco Use  . Smoking status: Former Smoker    Years: 2.00    Types: Cigarettes    Last attempt to quit: 10/09/2017    Years since quitting: 0.8  . Smokeless tobacco: Never Used  Substance Use Topics  . Alcohol use: No    FAMILY HISTORY: Family History  Problem Relation Age of Onset  . Breast cancer Maternal Grandmother   . Alcohol abuse Father   . Lung disease Father   . Epilepsy  Father     EXAM: BP (!) 168/86 (BP Location: Right Arm)   Pulse 61   Temp 97.7 F (36.5 C) (Oral)   Resp 18   Ht 5\' 4"  (1.626 m)   Wt (!) 147.9 kg   SpO2 98%   BMI 55.96 kg/m  CONSTITUTIONAL: Alert and oriented and responds appropriately to questions. Well-appearing; well-nourished, obese, afebrile HEAD: Normocephalic EYES: Conjunctivae clear, pupils appear equal, EOMI ENT: normal nose; moist mucous membranes NECK: Supple, no meningismus, no nuchal rigidity, no LAD  CARD: RRR; S1 and S2 appreciated; no murmurs, no clicks, no rubs, no gallops RESP: Normal chest excursion without splinting or tachypnea; breath sounds clear and equal bilaterally; no wheezes, no rhonchi, no rales, no hypoxia or respiratory distress, speaking full sentences ABD/GI: Normal bowel sounds; non-distended; soft, tender to palpation throughout the abdomen but mostly around the umbilicus and in the right lower quadrant, no rebound or guarding, no hernia appreciated BACK:  The back appears normal and is non-tender to palpation, there is no CVA tenderness EXT: Normal ROM in all joints; non-tender to palpation; no edema; normal capillary refill; no cyanosis, no calf tenderness or swelling    SKIN: Normal color for age and race; warm; no rash NEURO: Moves all extremities equally PSYCH: The patient's mood and manner are appropriate. Grooming and personal hygiene are appropriate.  MEDICAL DECISION MAKING: Patient here with complaints of abdominal pain.  Differential includes appendicitis, cholecystitis, bowel obstruction, strained muscle, recurrent hernia, UTI.  Will obtain labs, urine and a CT of her abdomen pelvis.  She drove herself to the emergency department and would like to drive home if work-up is unremarkable.  Will start with Toradol for pain.  ED PROGRESS: Patient reports feeling much better.  Her labs are unremarkable.  CT scan shows an umbilical hernia that contains only fat that is about 3 cm but the fat  does appear more edematous than previous study which could be the cause of her symptoms today.  I recommended that she continue alternating Tylenol and Motrin at home.  She has been able to ambulate to the restroom without difficulty.  She also states that she is concerned about STD exposure.  Reports that she had sex 2 weeks ago and the condom "came off".  She would like HIV testing.  Have offered other STD screening including syphilis testing which she declines today.  She will follow-up on these results through my chart.  She currently has no GU complaints.   5:00 AM  Pt's urine shows no sign of infection.  I feel she is safe to be discharged home.  Discussed return precautions.  She verbalized understanding.  Recommended alternating Tylenol and Motrin for pain.  HIV testing is pending.    At this time, I do not feel there is any life-threatening condition present. I have reviewed and  discussed all results (EKG, imaging, lab, urine as appropriate) and exam findings with patient/family. I have reviewed nursing notes and appropriate previous records.  I feel the patient is safe to be discharged home without further emergent workup and can continue workup as an outpatient as needed. Discussed usual and customary return precautions. Patient/family verbalize understanding and are comfortable with this plan.  Outpatient follow-up has been provided as needed. All questions have been answered.     Ward, Layla Maw, DO 08/06/18 [phone]

## 2018-08-06 NOTE — Discharge Instructions (Signed)
You may alternate Tylenol 1000 mg every 6 hours as needed for pain and Ibuprofen 800 mg every 8 hours as needed for pain.  Please take Ibuprofen with food. ° °

## 2018-08-07 LAB — HIV ANTIBODY (ROUTINE TESTING W REFLEX): HIV Screen 4th Generation wRfx: NONREACTIVE

## 2018-08-25 ENCOUNTER — Other Ambulatory Visit: Payer: Self-pay

## 2018-08-25 ENCOUNTER — Emergency Department (HOSPITAL_BASED_OUTPATIENT_CLINIC_OR_DEPARTMENT_OTHER)
Admission: EM | Admit: 2018-08-25 | Discharge: 2018-08-25 | Disposition: A | Discharge status: Home / Self Care | Payer: 59 | Attending: Emergency Medicine | Admitting: Emergency Medicine

## 2018-08-25 ENCOUNTER — Encounter (HOSPITAL_BASED_OUTPATIENT_CLINIC_OR_DEPARTMENT_OTHER): Payer: Self-pay | Admitting: Emergency Medicine

## 2018-08-25 DIAGNOSIS — R109 Unspecified abdominal pain: Secondary | ICD-10-CM | POA: Insufficient documentation

## 2018-08-25 DIAGNOSIS — Z87891 Personal history of nicotine dependence: Secondary | ICD-10-CM | POA: Insufficient documentation

## 2018-08-25 DIAGNOSIS — J45909 Unspecified asthma, uncomplicated: Secondary | ICD-10-CM | POA: Insufficient documentation

## 2018-08-25 DIAGNOSIS — Z79899 Other long term (current) drug therapy: Secondary | ICD-10-CM | POA: Insufficient documentation

## 2018-08-25 DIAGNOSIS — J029 Acute pharyngitis, unspecified: Secondary | ICD-10-CM | POA: Diagnosis present

## 2018-08-25 LAB — GROUP A STREP BY PCR: Group A Strep by PCR: NOT DETECTED

## 2018-08-25 MED ORDER — ACETAMINOPHEN 500 MG PO TABS
1000.0000 mg | ORAL_TABLET | Freq: Once | ORAL | Status: AC
  Administered 2018-08-25: 1000 mg via ORAL
  Filled 2018-08-25: qty 2

## 2018-08-25 NOTE — ED Provider Notes (Addendum)
[facility] EMERGENCY DEPARTMENT Provider Note   CSN: [id number] Arrival date & time: 08/25/18  0058    History   Chief Complaint Chief Complaint  Patient presents with  . Sore Throat  . Hernia    HPI Misty Foster is a 48 y.o. female.     The history is provided by the patient.  Sore Throat  This is a new problem. The current episode started yesterday. The problem occurs constantly. The problem has not changed since onset.Pertinent negatives include no chest pain, no abdominal pain, no headaches and no shortness of breath. Nothing aggravates the symptoms. Nothing relieves the symptoms. She has tried nothing for the symptoms. The treatment provided no relief.  The Also would like her fat containing umbilical hernia rechecked as she worked out and it is sore.  No f/c/r.  No cough or shortness of breath.  No anosmia.  No sick contacts.  She is doing [employer]ie [employer] work outs and this caused her pain and her home oxycodone is not managing it.  No n/v/d.    Past Medical History:  Diagnosis Date  . Arthritis   . Asthma   . Back pain   . Chronic back pain   . Incomplete rotator cuff tear or rupture of left shoulder, not specified as traumatic 04/07/2017  . Seizures (HCC)   . 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 Incomplete rotator cuff tear or rupture of left shoulder, not specified as traumatic 04/07/2017  . Left rotator cuff tear 04/07/2017  . Left shoulder pain 01/01/2017  . Pain in right hip 11/10/2016  . Chronic right-sided low back pain without sciatica 11/10/2016  . 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 Neuropathy 06/20/2016  . Chronic back pain 05/01/2016  . Migraine without aura and without status migrainosus, not intractable 02/11/2015  . Wallace CullensGray matter heterotopia (HCC) 05/11/2012     Past Surgical History:  Procedure Laterality Date  . ABLATION     cervical  . BIOPSY  05/14/2018   Procedure: BIOPSY;  Surgeon: Kathi DerBrahmbhatt, Parag, MD;  Location: [facility];  Service: Gastroenterology;;  . CESAREAN SECTION     x3  . ESOPHAGOGASTRODUODENOSCOPY (EGD) WITH PROPOFOL N/A 05/14/2018   Procedure: ESOPHAGOGASTRODUODENOSCOPY (EGD) WITH PROPOFOL;  Surgeon: Kathi DerBrahmbhatt, Parag, MD;  Location: [facility];  Service: Gastroenterology;  Laterality: N/A;  . SHOULDER ARTHROSCOPY WITH ROTATOR CUFF REPAIR Left 04/07/2017   Procedure: SHOULDER ARTHROSCOPY WITH ROTATOR CUFF REPAIR, ACROMIOPLASTY AND EXTENSIVE DEBRIDEMENT;  Surgeon: Teryl LucyLandau, Joshua, MD;  Location: [facility];  Service: Orthopedics;  Laterality: Left;     OB History    Gravida  4   Para  3   Term      Preterm      AB  1   Living        SAB      TAB      Ectopic      Multiple      Live Births               Home Medications    Prior to Admission medications   Medication Sig Start Date End Date Taking? Authorizing Provider  albuterol (VENTOLIN HFA) 108 (90 Base) MCG/ACT inhaler Inhale 2 puffs into the lungs every 6 (six) hours as needed for wheezing or shortness of breath.  09/13/15   [provider]  Ascorbic  Acid (VITAMIN C) 1000 MG tablet Take 1,000 mg by mouth daily.    [provider]  baclofen (LIORESAL) 20 MG tablet Take 20-40 mg by mouth daily as needed for pain. 01/06/18   [provider]  cyclobenzaprine (FLEXERIL) 10 MG tablet Take 10 mg by mouth at bedtime.    [provider]  dicyclomine (BENTYL) 20 MG tablet Take 1 tablet (20 mg total) by mouth 3 (three) times daily before meals for 5 days. 03/17/18 03/22/18  Shaune Pollack, MD  ibuprofen (ADVIL,MOTRIN) 200 MG tablet Take 600-800 mg by mouth every 6 (six) hours as needed for moderate pain.    [provider]  Multiple Vitamins-Minerals (MULTIVITAMIN WITH MINERALS) tablet Take 1 tablet by mouth daily.     [provider]  ondansetron (ZOFRAN ODT) 4 MG disintegrating tablet Take 1 tablet (4 mg total) by mouth every 8 (eight) hours as needed for nausea or vomiting. 03/17/18   Shaune Pollack, MD  oxyCODONE-acetaminophen (PERCOCET) 5-325 MG tablet Take 1-2 tablets by mouth every 4 (four) hours as needed. 05/28/18   Gilda Crease, MD  pantoprazole (PROTONIX) 40 MG tablet Take 40 mg by mouth daily.    [provider]  PHENobarbital (LUMINAL) 97.2 MG tablet Take 1 and 1 half tablets by mouth each evening 03/05/18   Van Clines, MD  phentermine 37.5 MG capsule Take 37.5 mg by mouth every morning.    [provider]    Family History Family History  Problem Relation Age of Onset  . Breast cancer Maternal Grandmother   . Alcohol abuse Father   . Lung disease Father   . Epilepsy Father     Social History Social History   Tobacco Use  . Smoking status: Former Smoker    Years: 2.00    Types: Cigarettes    Last attempt to quit: 10/09/2017    Years since quitting: 0.8  . Smokeless tobacco: Never Used  Substance Use Topics  . Alcohol use: No  . Drug use: No     Allergies   Lamictal [lamotrigine]; Lortab [hydrocodone-acetaminophen]; and Gabapentin   Review of Systems Review of Systems  Constitutional: Negative for fever.  HENT: Positive for sore throat. Negative for sneezing.   Respiratory: Negative for cough and shortness of breath.   Cardiovascular: Negative for chest pain, palpitations and leg swelling.  Gastrointestinal: Negative for abdominal pain.  Neurological: Negative for headaches.  All other systems reviewed and are negative.    Physical Exam Updated Vital Signs BP (!) 142/84 (BP Location: Left Arm)   Pulse 86   Temp 97.6 F (36.4 C) (Oral)   Resp 18   Ht  (1.626 m)   Wt (!) 147.9 kg   SpO2 96%   BMI 55.96 kg/m   Physical Exam Constitutional:      General: She is not in acute distress.    Appearance: She is normal  weight. She is not toxic-appearing.  HENT:     Head: Normocephalic and atraumatic.     Nose: Nose normal.     Mouth/Throat:     Comments: Intact phonation no pain with displacement of the trachea Eyes:     Conjunctiva/sclera: Conjunctivae normal.     Pupils: Pupils are equal, round, and reactive to light.  Neck:     Musculoskeletal: Normal range of motion and neck supple.  Cardiovascular:     Rate and Rhythm: Normal rate and regular rhythm.     Pulses: Normal pulses.  Heart sounds: Normal heart sounds.  Pulmonary:     Effort: Pulmonary effort is normal. No respiratory distress.     Breath sounds: Normal breath sounds. No wheezing or rales.  Abdominal:     General: Abdomen is flat. Bowel sounds are normal.     Palpations: There is no mass.     Tenderness: There is no abdominal tenderness. There is no guarding or rebound.     Hernia: No hernia is present.  Musculoskeletal: Normal range of motion.  Lymphadenopathy:     Cervical: No cervical adenopathy.  Skin:    General: Skin is warm and dry.     Capillary Refill: Capillary refill takes less than 2 seconds.  Neurological:     General: No focal deficit present.     Mental Status: She is alert and oriented to person, place, and time.  Psychiatric:        Mood and Affect: Mood normal.        Behavior: Behavior normal.      ED Treatments / Results  Labs (all labs ordered are listed, but only abnormal results are displayed) Labs Reviewed  GROUP A STREP BY PCR    EKG None  Radiology No results found.  Procedures Procedures (including critical care time)  Medications Ordered in ED Medications  acetaminophen (TYLENOL) tablet 1,000 mg (has no administration in time range)    Patient has several complaints.  I do not think this is covid as the patient has not been out.  The throat is more scratchy consistent with allergies and have advised zyrtec.  Ice abdominal wall and take tylenol.    Final Clinical  Impressions(s) / ED Diagnoses   Return for intractable cough, coughing up blood,fevers >100.4 unrelieved by medication, shortness of breath, intractable vomiting, chest pain, shortness of breath, weakness,numbness, changes in speech, facial asymmetry,abdominal pain, passing out,Inability to tolerate liquids or food, cough, altered mental status or any concerns. No signs of systemic illness or infection. The patient is nontoxic-appearing on exam and vital signs are within normal limits.   I have reviewed the triage vital signs and the nursing notes. Pertinent labs &imaging results that were available during my care of the patient were reviewed by me and considered in my medical decision making (see chart for details).  After history, exam, and medical workup I feel the patient has been appropriately medically screened and is safe for discharge home. Pertinent diagnoses were discussed with the patient. Patient was given return precautions.    Jasline Buskirk, MD 08/25/18 [id number]    Cy Blamer, MD 08/25/18 [id number]

## 2018-08-25 NOTE — ED Triage Notes (Signed)
Pt c/o sore throat x 2 days. Pt also has hernia and reports increased pain after working out today.

## 2018-08-27 ENCOUNTER — Telehealth (INDEPENDENT_AMBULATORY_CARE_PROVIDER_SITE_OTHER): Payer: 59 | Admitting: Neurology

## 2018-08-27 ENCOUNTER — Other Ambulatory Visit: Payer: Self-pay

## 2018-08-27 DIAGNOSIS — G40109 Localization-related (focal) (partial) symptomatic epilepsy and epileptic syndromes with simple partial seizures, not intractable, without status epilepticus: Secondary | ICD-10-CM | POA: Diagnosis not present

## 2018-08-27 MED ORDER — PHENOBARBITAL 97.2 MG PO TABS: ORAL_TABLET | ORAL | 3 refills | Status: DC

## 2018-08-27 NOTE — Progress Notes (Signed)
Virtual Visit via Video Note The purpose of this virtual visit is to provide medical care while limiting exposure to the novel coronavirus.    Consent was obtained for video visit:  Yes.   Answered questions that patient had about telehealth interaction:  Yes.   I discussed the limitations, risks, security and privacy concerns of performing an evaluation and management service by telemedicine. I also discussed with the patient that there may be a patient responsible charge related to this service. The patient expressed understanding and agreed to proceed.  Pt location: Home Physician Location: office Name of referring provider:  Levonne Lappingann, Samandra, NP I connected with Misty Foster at patients initiation/request on 08/27/2018 at  9:30 AM EDT by video enabled telemedicine application and verified that I am speaking with the correct person using two identifiers. 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 She continues to do well seizure-free since June 2017 on Phenobarbital 97.2mg  1.5 tabs daily without side effects. She denies any staring/unresponsive episodes, gaps in time, olfactory/gustatory hallucinations, focal numbness/tingling/weakness, myoclonic jerks. No headaches, dizziness, vision changes, no falls. 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   History On Initial Assessment 06/17/2016: This is a pleasant 48 yo RH woman with seizures since age 48 that she described as focal, then had her first and only generalized tonic-clonic seizure at age 48. She had been living in [place] and seeing [facility], records were reviewed. 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 EEG in February 2013 reported as normal. She describes her focal seizures as starting with an aura where she sees herself across the room. Then either arm would start having a tremor, sometimes starting from the forearm and traveling up to her shoulder lasting a few seconds. She would have speech difficulties and feel diffusely weak after. She denies any confusion, stating she had an episode of confusion a long time ago that was attributed to an over the counter medication, she would wake up "disillusioned," not knowing where she was. She states Benadryl and Splenda caused issues where she woke up and could not see, feeling disoriented. This occurred before June 2017 (when she left [place]). She has been on Phenobarbital since the beginning ("I love my Phenobarbital"), she has tried different medications but would have reactions to all of them. She recalls taking Dilantin, then Tegretol, which both worked great. She tried Lamictal, Gabapentin (dizzy/lightheaded), and Topamax (started losing vision), which did not help. She took Trileptal intermittently but it caused forgetfulness. She reports staring spells with some of her seizures, this is infrequent and has not been happening recently. She denies any olfactory/gustatory hallucinations, deja vu, rising epigastric sensation, focal numbness/tingling/weakness, myoclonic jerks. She has paresthesias in her hands and feet and states that blood tests have been done in the past. She has been on current dose of Phenobarbital for the past 3-4 years without side effects. She denies any headaches, dizziness, diplopia, bowel/bladder dysfunction. She has chronic pain in her neck down her spine, spasms across her abdomen, knee pain. She reports a history of cervical radiculopathy and right rotator cuff injury.   Epilepsy Risk Factors:  Her  father had seizures when he was  younger. She had bacterial meningitis in 1994. Otherwise she had a normal birth and early development.  There is no history of febrile convulsions, significant traumatic brain injury, neurosurgical procedures, or family history of seizures.  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 EEG in February 2013 reported as normal.  Bone density scan 06/2016: normal Phenobarbital levels: 04/2012 was 10.2, 05/1999 was 22.1  Observations/Objective:   Patient is awake, alert, oriented x 3. No aphasia or dysarthria. Intact fluency and comprehension. Remote and recent memory intact. Able to name and repeat. Cranial nerves: Extraocular movements intact with no nystagmus. No facial asymmetry. Motor: moves all extremities symmetrically, at least anti-gravity x 4. No incoordination on finger to nose testing. Gait: narrow-based and steady, able to tandem walk adequately. Negative Romberg test.  Assessment and Plan:   This is a pleasant 48 yo RH woman with a history of seizures since childhood. 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 EEG in February 2013 reported as normal. There is also note of non-epileptic events, however I do not find any records on this. She has been seizure-free since June 2017 on Phenobarbital 97.2mg  1.5 tabs daily, no side effects. Refills sent. She is aware of [place] driving laws to stop driving after a seizure, until 6 months seizure-free. She will follow-up in 1 year at which point follow-up bone density scan will be ordered. She knows to call for any changes  Follow Up Instructions:   -I discussed the assessment and treatment plan with the  patient. The patient was provided an opportunity to ask questions and all were answered. The patient agreed with the plan and demonstrated an understanding of the instructions.   The patient was advised to call back or seek an in-person evaluation if the symptoms worsen or if the condition fails to improve as anticipated.    Van Clines, MD

## 2018-09-02 ENCOUNTER — Ambulatory Visit: Payer: Managed Care, Other (non HMO) | Admitting: Neurology

## 2018-09-24 ENCOUNTER — Encounter (HOSPITAL_BASED_OUTPATIENT_CLINIC_OR_DEPARTMENT_OTHER): Payer: Self-pay | Admitting: Emergency Medicine

## 2018-09-24 ENCOUNTER — Emergency Department (HOSPITAL_BASED_OUTPATIENT_CLINIC_OR_DEPARTMENT_OTHER)
Admission: EM | Admit: 2018-09-24 | Discharge: 2018-09-24 | Disposition: A | Discharge status: Home / Self Care | Payer: 59 | Attending: Emergency Medicine | Admitting: Emergency Medicine

## 2018-09-24 ENCOUNTER — Other Ambulatory Visit: Payer: Self-pay

## 2018-09-24 DIAGNOSIS — J039 Acute tonsillitis, unspecified: Secondary | ICD-10-CM | POA: Diagnosis not present

## 2018-09-24 DIAGNOSIS — R300 Dysuria: Secondary | ICD-10-CM | POA: Diagnosis not present

## 2018-09-24 DIAGNOSIS — R070 Pain in throat: Secondary | ICD-10-CM | POA: Diagnosis present

## 2018-09-24 DIAGNOSIS — Z87891 Personal history of nicotine dependence: Secondary | ICD-10-CM | POA: Insufficient documentation

## 2018-09-24 DIAGNOSIS — Z79899 Other long term (current) drug therapy: Secondary | ICD-10-CM | POA: Diagnosis not present

## 2018-09-24 DIAGNOSIS — Z711 Person with feared health complaint in whom no diagnosis is made: Secondary | ICD-10-CM

## 2018-09-24 DIAGNOSIS — J45909 Unspecified asthma, uncomplicated: Secondary | ICD-10-CM | POA: Diagnosis not present

## 2018-09-24 LAB — WET PREP, GENITAL
Clue Cells Wet Prep HPF POC: NONE SEEN
Sperm: NONE SEEN
Trich, Wet Prep: NONE SEEN
Yeast Wet Prep HPF POC: NONE SEEN

## 2018-09-24 LAB — URINALYSIS, ROUTINE W REFLEX MICROSCOPIC
Bilirubin Urine: NEGATIVE
Glucose, UA: NEGATIVE mg/dL
Hgb urine dipstick: NEGATIVE
Ketones, ur: NEGATIVE mg/dL
Nitrite: NEGATIVE
Protein, ur: NEGATIVE mg/dL
Specific Gravity, Urine: 1.01 (ref 1.005–1.030)
pH: 6.5 (ref 5.0–8.0)

## 2018-09-24 LAB — URINALYSIS, MICROSCOPIC (REFLEX)

## 2018-09-24 LAB — GROUP A STREP BY PCR: Group A Strep by PCR: NOT DETECTED

## 2018-09-24 MED ORDER — FLUCONAZOLE 150 MG PO TABS: ORAL_TABLET | ORAL | 0 refills | Status: DC

## 2018-09-24 MED ORDER — AMOXICILLIN 500 MG PO CAPS
1000.0000 mg | ORAL_CAPSULE | Freq: Once | ORAL | Status: AC
  Administered 2018-09-24: 1000 mg via ORAL
  Filled 2018-09-24: qty 2

## 2018-09-24 MED ORDER — AMOXICILLIN 500 MG PO CAPS: 1000.0000 mg | ORAL_CAPSULE | Freq: Every day | ORAL | 0 refills | Status: DC

## 2018-09-24 NOTE — ED Provider Notes (Signed)
[facility] Provider Note: Lowella Dell, MD, FACEP  CSN: [id number] MRN: [id number] ARRIVAL: 09/24/18 at 0058 ROOM: [facility]   CHIEF COMPLAINT  Sore Throat   HISTORY OF PRESENT ILLNESS  09/24/18 1:34 AM Misty Foster is a 48 y.o. female who complains of a sore throat for the past 5 or 6 days.  She rates her pain as a 5 out of 10, worse with swallowing.  She has not had an associated fever.  She has noticed white patches in the back of her throat.  She also had some mild dysuria and she thought she might have a yeast infection.  She used over-the-counter Monistat which is caused her to some vulvovaginal irritation.  She is concerned that her throat pain and dysuria may be related to an STD.   Past Medical History:  Diagnosis Date  . Arthritis   . Asthma   . Back pain   . Chronic back pain   . Incomplete rotator cuff tear or rupture of left shoulder, not specified as traumatic 04/07/2017  . Seizures (HCC)   . Tuberous sclerosis [facility])     Past Surgical History:  Procedure Laterality Date  . ABLATION     cervical  . BIOPSY  05/14/2018   Procedure: BIOPSY;  Surgeon: Kathi Der, MD;  Location: [facility];  Service: Gastroenterology;;  . CESAREAN SECTION     x3  . ESOPHAGOGASTRODUODENOSCOPY (EGD) WITH PROPOFOL N/A 05/14/2018   Procedure: ESOPHAGOGASTRODUODENOSCOPY (EGD) WITH PROPOFOL;  Surgeon: Kathi Der, MD;  Location: [facility];  Service: Gastroenterology;  Laterality: N/A;  . 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 SHOULDER ARTHROSCOPY WITH ROTATOR CUFF REPAIR Left 04/07/2017   Procedure: SHOULDER ARTHROSCOPY WITH ROTATOR CUFF REPAIR, ACROMIOPLASTY AND EXTENSIVE DEBRIDEMENT;  Surgeon: Teryl Lucy, MD;  Location: [facility];  Service: Orthopedics;  Laterality: Left;    Family History  Problem Relation Age of Onset  . Breast cancer Maternal Grandmother   . Alcohol abuse Father   . Lung disease Father   . Epilepsy Father     Social History   Tobacco Use  . Smoking status: Former Smoker    Years: 2.00    Types: Cigarettes    Last attempt to quit: 10/09/2017    Years since quitting: 0.9  . Smokeless tobacco: Never Used  Substance Use Topics  . Alcohol use: No  . Drug use: No    Prior to Admission medications   Medication Sig Start Date End Date Taking? Authorizing Provider  albuterol (VENTOLIN HFA) 108 (90 Base) MCG/ACT inhaler Inhale 2 puffs into the lungs every 6 (six) hours as needed for wheezing or shortness of breath.  09/13/15   [provider]  Ascorbic Acid (VITAMIN C) 1000 MG tablet Take 1,000 mg by mouth daily.    [provider]  cyclobenzaprine (FLEXERIL) 10 MG tablet Take 10 mg by mouth at bedtime.    [provider]  Multiple Vitamins-Minerals (MULTIVITAMIN WITH MINERALS) tablet Take 1 tablet by mouth daily.    [provider]  oxyCODONE-acetaminophen (PERCOCET) 5-325 MG tablet Take 1-2 tablets by mouth every 4 (four) hours as needed. 05/28/18   Gilda Crease, MD  PHENobarbital (LUMINAL) 97.2 MG tablet Take 1.5 tablets each evening 08/27/18   Van Clines, MD  vitamin B-12 (CYANOCOBALAMIN) 1000 MCG tablet Take 1,000 mcg by mouth daily.    [provider]    Allergies Lamictal [lamotrigine]; Lortab [hydrocodone-acetaminophen]; Nsaids; and Gabapentin   REVIEW OF SYSTEMS  Negative except  as noted here or in the History of Present Illness.   PHYSICAL EXAMINATION  Initial Vital Signs Blood pressure 121/82, pulse 69, temperature 97.8 F (36.6 C), temperature source Oral, resp. rate 18, height  (1.626 m), weight (!) 138.8 kg, SpO2 100 %.  Examination General: Well-developed, well-nourished female in no acute distress; appearance consistent with age of record HENT: normocephalic; atraumatic; tonsillar enlargement and erythema with purulent exudate Eyes: pupils equal, round and reactive to light; extraocular muscles intact Neck: supple; mild anterior  cervical lymphadenopathy Heart: regular rate and rhythm Lungs: clear to auscultation bilaterally Abdomen: soft; obese; nontender; bowel sounds present GU: No CVA tenderness Extremities: No deformity; full range of motion; pulses normal Neurologic: Awake, alert and oriented; motor function intact in all extremities and symmetric; no facial droop Skin: Warm and dry Psychiatric: Normal mood and affect   RESULTS  Summary of this visit's results, reviewed by myself:   EKG Interpretation  Date/Time:    Ventricular Rate:    PR Interval:    QRS Duration:   QT Interval:    QTC Calculation:   R Axis:     Text Interpretation:        Laboratory Studies: Results for orders placed or performed during the hospital encounter of 09/24/18 (from the past 24 hour(s))  Urinalysis, Routine w reflex microscopic     Status: Abnormal   Collection Time: 09/24/18  1:30 AM  Result Value Ref Range   Color, Urine YELLOW YELLOW   APPearance CLOUDY (A) CLEAR   Specific Gravity, Urine 1.010 1.005 - 1.030   pH 6.5 5.0 - 8.0   Glucose, UA NEGATIVE NEGATIVE mg/dL   Hgb urine dipstick NEGATIVE NEGATIVE   Bilirubin Urine NEGATIVE NEGATIVE   Ketones, ur NEGATIVE NEGATIVE mg/dL   Protein, ur NEGATIVE NEGATIVE mg/dL   Nitrite NEGATIVE NEGATIVE   Leukocytes,Ua TRACE (A) NEGATIVE  Urinalysis, Microscopic (reflex)     Status: Abnormal   Collection Time: 09/24/18  1:30 AM  Result Value Ref Range   RBC / HPF 0-5 0 - 5 RBC/hpf   WBC, UA 0-5 0 - 5 WBC/hpf   Bacteria, UA MANY (A) NONE SEEN   Squamous Epithelial / LPF 0-5 0 - 5   Mucus PRESENT   Group A Strep by PCR     Status: None   Collection Time: 09/24/18  1:43 AM  Result Value Ref Range   Group A Strep by PCR NOT DETECTED NOT DETECTED  Wet prep, genital     Status: Abnormal   Collection Time: 09/24/18  1:43 AM  Result Value Ref Range   Yeast Wet Prep HPF POC NONE SEEN NONE SEEN   Trich, Wet Prep NONE SEEN NONE SEEN   Clue Cells Wet Prep HPF POC NONE  SEEN NONE SEEN   WBC, Wet Prep HPF POC MODERATE (A) NONE SEEN   Sperm NONE SEEN    Imaging Studies: No results found.  ED COURSE and MDM  Nursing notes and initial vitals signs, including pulse oximetry, reviewed.  Vitals:   09/24/18 0106 09/24/18 0110  BP:  121/82  Pulse:  69  Resp:  18  Temp:  97.8 F (36.6 C)  TempSrc:  Oral  SpO2:  100%  Weight: (!) 138.8 kg   Height:  (1.626 m)    GC and chlamydia pharyngeal and genital samples sent for testing.  2:25 AM Patient advised of lab results.  She was advised that GC and Chlamydia are pending and she will be  contacted if either is positive.  She would like to be treated for tonsillitis in the meantime.  PROCEDURES    ED DIAGNOSES     ICD-10-CM   1. Tonsillitis J03.90   2. Concern about STD in female without diagnosis Z71.1        Jiaire Rosebrook, Jonny RuizJohn, MD 09/24/18 [id number]

## 2018-09-24 NOTE — ED Triage Notes (Signed)
Patient co sore throat with white patches on back of throat; also so urinary sx; requesting std check.

## 2018-09-25 LAB — URINE CULTURE: Culture: NO GROWTH

## 2018-09-27 LAB — GC/CHLAMYDIA PROBE AMP (~~LOC~~) NOT AT ARMC
Chlamydia: NEGATIVE
Chlamydia: NEGATIVE
Neisseria Gonorrhea: NEGATIVE
Neisseria Gonorrhea: NEGATIVE

## 2018-11-02 ENCOUNTER — Encounter (HOSPITAL_BASED_OUTPATIENT_CLINIC_OR_DEPARTMENT_OTHER): Payer: Self-pay | Admitting: Emergency Medicine

## 2018-11-02 ENCOUNTER — Emergency Department (HOSPITAL_BASED_OUTPATIENT_CLINIC_OR_DEPARTMENT_OTHER)
Admission: EM | Admit: 2018-11-02 | Discharge: 2018-11-02 | Disposition: A | Discharge status: Home / Self Care | Payer: 59 | Attending: Emergency Medicine | Admitting: Emergency Medicine

## 2018-11-02 ENCOUNTER — Other Ambulatory Visit: Payer: Self-pay

## 2018-11-02 DIAGNOSIS — S39012A Strain of muscle, fascia and tendon of lower back, initial encounter: Secondary | ICD-10-CM | POA: Insufficient documentation

## 2018-11-02 DIAGNOSIS — M545 Low back pain, unspecified: Secondary | ICD-10-CM

## 2018-11-02 DIAGNOSIS — Z87891 Personal history of nicotine dependence: Secondary | ICD-10-CM | POA: Insufficient documentation

## 2018-11-02 DIAGNOSIS — Y9389 Activity, other specified: Secondary | ICD-10-CM | POA: Insufficient documentation

## 2018-11-02 DIAGNOSIS — Y9289 Other specified places as the place of occurrence of the external cause: Secondary | ICD-10-CM | POA: Insufficient documentation

## 2018-11-02 DIAGNOSIS — J45909 Unspecified asthma, uncomplicated: Secondary | ICD-10-CM | POA: Insufficient documentation

## 2018-11-02 DIAGNOSIS — G8929 Other chronic pain: Secondary | ICD-10-CM | POA: Insufficient documentation

## 2018-11-02 DIAGNOSIS — T148XXA Other injury of unspecified body region, initial encounter: Secondary | ICD-10-CM

## 2018-11-02 DIAGNOSIS — S3992XA Unspecified injury of lower back, initial encounter: Secondary | ICD-10-CM | POA: Diagnosis present

## 2018-11-02 DIAGNOSIS — X503XXA Overexertion from repetitive movements, initial encounter: Secondary | ICD-10-CM | POA: Diagnosis not present

## 2018-11-02 DIAGNOSIS — Y990 Civilian activity done for income or pay: Secondary | ICD-10-CM | POA: Insufficient documentation

## 2018-11-02 MED ORDER — ONDANSETRON 4 MG PO TBDP
4.0000 mg | ORAL_TABLET | Freq: Once | ORAL | Status: AC
  Administered 2018-11-02: 4 mg via ORAL
  Filled 2018-11-02: qty 1

## 2018-11-02 MED ORDER — DEXAMETHASONE SODIUM PHOSPHATE 10 MG/ML IJ SOLN
10.0000 mg | Freq: Once | INTRAMUSCULAR | Status: AC
  Administered 2018-11-02: 10 mg via INTRAMUSCULAR
  Filled 2018-11-02: qty 1

## 2018-11-02 MED ORDER — KETOROLAC TROMETHAMINE 60 MG/2ML IM SOLN
30.0000 mg | Freq: Once | INTRAMUSCULAR | Status: AC
  Administered 2018-11-02: 30 mg via INTRAMUSCULAR
  Filled 2018-11-02: qty 2

## 2018-11-02 NOTE — ED Provider Notes (Signed)
[facility] EMERGENCY DEPARTMENT Provider Note  CSN: [id number] Arrival date & time: 11/02/18 0410  Chief Complaint(s) Back Pain  HPI Misty Foster is a 48 y.o. female   The history is provided by the patient.  Back Pain Location:  Sacro-iliac joint and lumbar spine Quality:  Aching Radiates to: to bilateral buttocks. Pain severity:  Moderate Onset quality:  Gradual Duration:  2 weeks Timing: initally intermittent, now constant. Progression:  Waxing and waning Chronicity:  Recurrent Context: not falling   Context comment:  Reports that her chair at work exacerbated her back pain Relieved by: certain sitting positions. Worsened by:  Twisting and touching Associated symptoms: no bladder incontinence, no bowel incontinence, no leg pain, no numbness, no paresthesias and no perianal numbness     Past Medical History Past Medical History:  Diagnosis Date  . Arthritis   . Asthma   . Back pain   . Chronic back pain   . Incomplete rotator cuff tear or rupture of left shoulder, not specified as traumatic 04/07/2017  . Seizures (HCC)   . 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 Prediabetes 05/10/2018  . Hyperlipidemia 05/06/2018  . OSA (obstructive sleep apnea) 05/06/2018  . Seizure disorder (HCC) 05/06/2018  . Morbid obesity due to excess calories (HCC) 04/30/2018  . Incomplete rotator cuff tear or rupture of left shoulder, not specified as traumatic 04/07/2017  . Left rotator cuff tear 04/07/2017  . Left shoulder pain 01/01/2017  . Pain in right hip 11/10/2016  . Chronic right-sided low back pain without sciatica 11/10/2016  . 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 Neuropathy 06/20/2016  . Chronic back pain 05/01/2016  . Migraine without aura and without status migrainosus, not intractable  02/11/2015  . 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 Authorizing Provider  albuterol (VENTOLIN HFA) 108 (90 Base) MCG/ACT inhaler Inhale 2 puffs into the lungs every 6 (six) hours as needed for wheezing or shortness of breath.  09/13/15   [provider]  amoxicillin (AMOXIL) 500 MG capsule Take 2 capsules (1,000 mg total) by mouth daily. 09/24/18   Molpus, John, MD  Ascorbic Acid (VITAMIN C) 1000 MG tablet Take 1,000 mg by mouth daily.    [provider]  cyclobenzaprine (FLEXERIL) 10 MG tablet Take 10 mg by mouth at bedtime.    [provider]  fluconazole (DIFLUCAN) 150 MG tablet Take 1 tablet as needed for vaginal yeast infection.  May repeat in 3 days if symptoms persist. 09/24/18   Molpus, Jonny RuizJohn, MD  Multiple Vitamins-Minerals (MULTIVITAMIN WITH MINERALS) tablet Take 1 tablet by mouth daily.    [provider]  oxyCODONE-acetaminophen (PERCOCET) 5-325 MG tablet Take 1-2 tablets by mouth every 4 (four) hours as needed. 05/28/18   Gilda CreasePollina, Christopher J, MD  PHENobarbital (LUMINAL) 97.2 MG tablet Take 1.5 tablets each evening 08/27/18   Van ClinesAquino, Karen M, MD  vitamin B-12 (CYANOCOBALAMIN) 1000 MCG tablet Take 1,000 mcg by mouth daily.    [provider]  Past Surgical History Past Surgical History:  Procedure Laterality Date  . ABLATION     cervical  . BIOPSY  05/14/2018   Procedure: BIOPSY;  Surgeon: Kathi DerBrahmbhatt, Parag, MD;  Location: [facility];  Service: Gastroenterology;;  . CESAREAN SECTION     x3  . ESOPHAGOGASTRODUODENOSCOPY (EGD) WITH PROPOFOL N/A 05/14/2018   Procedure: ESOPHAGOGASTRODUODENOSCOPY (EGD) WITH PROPOFOL;  Surgeon: Kathi DerBrahmbhatt, Parag, MD;  Location: [facility];  Service: Gastroenterology;  Laterality: N/A;   . 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 SHOULDER ARTHROSCOPY WITH ROTATOR CUFF REPAIR Left 04/07/2017   Procedure: SHOULDER ARTHROSCOPY WITH ROTATOR CUFF REPAIR, ACROMIOPLASTY AND EXTENSIVE DEBRIDEMENT;  Surgeon: Teryl LucyLandau, Joshua, MD;  Location: [facility] OR;  Service: Orthopedics;  Laterality: Left;   Family History Family History  Problem Relation Age of Onset  . Breast cancer Maternal Grandmother   . Alcohol abuse Father   . Lung disease Father   . Epilepsy Father     Social History Social History   Tobacco Use  . Smoking status: Former Smoker    Years: 2.00    Types: Cigarettes    Quit date: 10/09/2017    Years since quitting: 1.0  . Smokeless tobacco: Never Used  Substance Use Topics  . Alcohol use: No  . Drug use: No   Allergies Lamictal [lamotrigine], Lortab [hydrocodone-acetaminophen], Nsaids, and Gabapentin  Review of Systems Review of Systems  Gastrointestinal: Negative for bowel incontinence.  Genitourinary: Negative for bladder incontinence.  Musculoskeletal: Positive for back pain.  Neurological: Negative for numbness and paresthesias.   All other systems are reviewed and are negative for acute change except as noted in the HPI  Physical Exam Vital Signs  I have reviewed the triage vital signs BP 116/80 (BP Location: Right Arm)   Pulse 60   Temp 97.6 F (36.4 C) (Oral)   Resp 18   Ht 5\' 4"  (1.626 m)   Wt 134.3 kg   LMP 10/03/2018 (Approximate)   SpO2 100%   BMI 50.81 kg/m   Physical Exam Vitals signs reviewed.  Constitutional:      General: She is not in acute distress.    Appearance: She is well-developed. She is obese. She is not diaphoretic.  HENT:     Head: Normocephalic and atraumatic.     Right Ear: External ear normal.     Left Ear: External ear normal.     Nose: Nose normal.  Eyes:     General: No scleral icterus.    Conjunctiva/sclera: Conjunctivae normal.  Neck:     Musculoskeletal: Normal range of motion.     Trachea: Phonation normal.   Cardiovascular:     Rate and Rhythm: Normal rate and regular rhythm.  Pulmonary:     Effort: Pulmonary effort is normal. No respiratory distress.     Breath sounds: No stridor.  Abdominal:     General: There is no distension.  Musculoskeletal: Normal range of motion.     Lumbar back: She exhibits tenderness and spasm. She exhibits no bony tenderness.       Back:  Neurological:     Mental Status: She is alert and oriented to person, place, and time.     Comments: Spine Exam: Strength: 5/5 throughout LE bilaterally (hip flexion/extension, adduction/abduction; knee flexion/extension; foot dorsiflexion/plantarflexion, inversion/eversion; great toe inversion) Sensation: Intact to light touch in proximal and distal LE bilaterally Reflexes: 1+ quadriceps    Psychiatric:        Behavior: Behavior normal.     ED Results and Treatments Labs (  all labs ordered are listed, but only abnormal results are displayed) Labs Reviewed - No data to display                                                                                                                       EKG  EKG Interpretation  Date/Time:    Ventricular Rate:    PR Interval:    QRS Duration:   QT Interval:    QTC Calculation:   R Axis:     Text Interpretation:        Radiology No results found.  Pertinent labs & imaging results that were available during my care of the patient were reviewed by me and considered in my medical decision making (see chart for details).  Medications Ordered in ED Medications  dexamethasone (DECADRON) injection 10 mg (10 mg Intramuscular Given 11/02/18 0448)  ketorolac (TORADOL) injection 30 mg (30 mg Intramuscular Given 11/02/18 0449)  ondansetron (ZOFRAN-ODT) disintegrating tablet 4 mg (4 mg Oral Given 11/02/18 0447)                                                                                                                                    Procedures Procedures  (including critical  care time)  Medical Decision Making / ED Course I have reviewed the nursing notes for this encounter and the patient's prior records (if available in EHR or on provided paperwork).   Misty Foster was evaluated in Emergency Department on 11/02/2018 for the symptoms described in the history of present illness. She was evaluated in the context of the global COVID-19 pandemic, which necessitated consideration that the patient might be at risk for infection with the SARS-CoV-2 virus that causes COVID-19. Institutional protocols and algorithms that pertain to the evaluation of patients at risk for COVID-19 are in a state of rapid change based on information released by regulatory bodies including the CDC and federal and state organizations. These policies and algorithms were followed during the patient's care in the ED.  48 y.o. female presents with back pain in lumbar area for several weeks without signs of radicular pain. No acute traumatic onset. No red flag symptoms of fever, weight loss, saddle anesthesia, weakness, fecal/urinary incontinence or urinary retention.  Suspect MSK etiology. No indication for imaging emergently.   Given Decadron and Toradol. Significant improvement with this.  Return precautions discussed for worsening or new concerning symptoms.  Final Clinical Impression(s) / ED Diagnoses Final diagnoses:  Muscle strain  Chronic bilateral low back pain without sciatica     The patient appears reasonably screened and/or stabilized for discharge and I doubt any other medical condition or other [facility] requiring further screening, evaluation, or treatment in the ED at this time prior to discharge.  Disposition: Discharge  Condition: Good  I have discussed the results, Dx and Tx plan with the patient who expressed understanding and agree(s) with the plan. Discharge instructions discussed at great length. The patient was given strict return precautions who  verbalized understanding of the instructions. No further questions at time of discharge.    ED Discharge Orders    None      Follow Up: Bryon LionsMoreira, Niall A, PA-C [address] [place] [id number] [phone]  Schedule an appointment as soon as possible for a visit  As needed     This chart was dictated using voice recognition software.  Despite best efforts to proofread,  errors can occur which can change the documentation meaning.   Nira Connardama, Arthelia Callicott Eduardo, MD 11/02/18 [phone]

## 2018-11-02 NOTE — ED Triage Notes (Signed)
Patient arrived via EMS c/o lower back pain with radiation to both legs. Patient has hx of same. Patient states pain is 6/10. Patient is AO x 4, slow gait.

## 2018-11-10 ENCOUNTER — Encounter (HOSPITAL_BASED_OUTPATIENT_CLINIC_OR_DEPARTMENT_OTHER): Payer: Self-pay | Admitting: *Deleted

## 2018-11-10 ENCOUNTER — Emergency Department (HOSPITAL_BASED_OUTPATIENT_CLINIC_OR_DEPARTMENT_OTHER)
Admission: EM | Admit: 2018-11-10 | Discharge: 2018-11-11 | Disposition: A | Discharge status: Home / Self Care | Payer: 59 | Attending: Emergency Medicine | Admitting: Emergency Medicine

## 2018-11-10 ENCOUNTER — Emergency Department (HOSPITAL_BASED_OUTPATIENT_CLINIC_OR_DEPARTMENT_OTHER): Payer: 59

## 2018-11-10 ENCOUNTER — Other Ambulatory Visit: Payer: Self-pay

## 2018-11-10 DIAGNOSIS — Z87891 Personal history of nicotine dependence: Secondary | ICD-10-CM | POA: Diagnosis not present

## 2018-11-10 DIAGNOSIS — Z79899 Other long term (current) drug therapy: Secondary | ICD-10-CM | POA: Diagnosis not present

## 2018-11-10 DIAGNOSIS — Z9884 Bariatric surgery status: Secondary | ICD-10-CM | POA: Insufficient documentation

## 2018-11-10 DIAGNOSIS — J45909 Unspecified asthma, uncomplicated: Secondary | ICD-10-CM | POA: Diagnosis not present

## 2018-11-10 DIAGNOSIS — M25551 Pain in right hip: Secondary | ICD-10-CM | POA: Insufficient documentation

## 2018-11-10 MED ORDER — KETOROLAC TROMETHAMINE 30 MG/ML IJ SOLN
30.0000 mg | Freq: Once | INTRAMUSCULAR | Status: AC
  Administered 2018-11-10: 30 mg via INTRAMUSCULAR
  Filled 2018-11-10: qty 1

## 2018-11-10 NOTE — ED Provider Notes (Signed)
[place] DEPT [facility] Provider Note: [name], MD, FACEP  CSN: [id number] MRN: [id number] ARRIVAL: 11/10/18 at [place]: [place]  Hip Pain   HISTORY OF PRESENT ILLNESS  11/10/18 11:27 PM Misty Foster is a 48 y.o. female with a history of chronic back pain not currently on chronic narcotics.  She was packing some books at her house this evening.  She was sitting on the floor and when she went to stand up she developed sudden, severe pain in her right hip.  The pain is centered about the greater trochanter but radiates more generally.  It is worse with movement of either hip.  There is no associated back pain and the pain is not similar to, nor as severe as, her usual back pain.  She denies new numbness or weakness.   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[place])     Past Surgical History:  Procedure Laterality Date   ABLATION     cervical   BIOPSY  05/14/2018   Procedure: BIOPSY;  Surgeon: Otis Brace, MD;  Location: [facility];  Service: Gastroenterology;;   CESAREAN SECTION     x3   ESOPHAGOGASTRODUODENOSCOPY (EGD) WITH PROPOFOL N/A 05/14/2018   Procedure: ESOPHAGOGASTRODUODENOSCOPY (EGD) WITH PROPOFOL;  Surgeon: Otis Brace, MD;  Location: [facility];  Service: Gastroenterology;  Laterality: N/A;   GASTRIC BYPASS  06/04/2018   SHOULDER ARTHROSCOPY WITH ROTATOR CUFF REPAIR Left 04/07/2017   Procedure: SHOULDER ARTHROSCOPY WITH ROTATOR CUFF REPAIR, ACROMIOPLASTY AND EXTENSIVE DEBRIDEMENT;  Surgeon: Marchia Bond, MD;  Location: [place];  Service: Orthopedics;  Laterality: Left;    Family History  Problem Relation Age of Onset   Breast cancer Maternal Grandmother    Alcohol abuse Father    Lung disease Father     Epilepsy Father     Social History   Tobacco Use   Smoking status: Former Smoker    Years: 2.00    Types: Cigarettes    Quit date: 10/09/2017    Years since quitting: 1.0   Smokeless tobacco: Never Used  Substance Use Topics   Alcohol use: No   Drug use: No    Prior to Admission medications   Medication Sig Start Date End Date Taking? Authorizing Provider  albuterol (VENTOLIN HFA) 108 (90 Base) MCG/ACT inhaler Inhale 2 puffs into the lungs every 6 (six) hours as needed for wheezing or shortness of breath.  09/13/15   [provider]  Ascorbic Acid (VITAMIN C) 1000 MG tablet Take 1,000 mg by mouth daily.    [provider]  cyclobenzaprine (FLEXERIL) 10 MG tablet Take 10 mg by mouth at bedtime.    [provider]  Multiple Vitamins-Minerals (MULTIVITAMIN WITH MINERALS) tablet Take 1 tablet by mouth daily.    [provider]  oxyCODONE-acetaminophen (PERCOCET) 5-325 MG tablet Take 1-2 tablets by mouth every 4 (four) hours as needed. 05/28/18   Orpah Greek, MD  PHENobarbital (LUMINAL) 97.2 MG tablet Take 1.5 tablets each evening 08/27/18   Cameron Sprang, MD  vitamin B-12 (CYANOCOBALAMIN) 1000 MCG tablet Take 1,000 mcg by mouth daily.    [provider]    Allergies Lamictal [lamotrigine], Lortab [hydrocodone-acetaminophen], Nsaids, and Gabapentin   REVIEW OF  SYSTEMS  Negative except as noted here or in the History of Present Illness.   PHYSICAL EXAMINATION  Initial Vital Signs Blood pressure (!) 144/88, pulse 74, temperature 98 F (36.7 C), temperature source Oral, resp. rate 16, height 5\' 4"  (1.626 m), weight 134 kg, SpO2 98 %.  Examination General: Well-developed, well-nourished female in no acute distress; appearance consistent with age of record HENT: normocephalic; atraumatic Eyes: Normal appearance Neck: supple Heart: regular rate and rhythm Lungs: clear to auscultation bilaterally Abdomen: soft; nondistended;  nontender; bowel sounds present Extremities: No deformity; pulses normal; tenderness of right hip with pain on movement of either hip passively Back: No lumbar tenderness Neurologic: Awake, alert and oriented; motor function intact in all extremities and symmetric but examination limited due to pain; sensation intact in lower extremities and symmetric; no facial droop Skin: Warm and dry Psychiatric: Normal mood and affect   RESULTS  Summary of this visit's results, reviewed by myself:   EKG Interpretation  Date/Time:    Ventricular Rate:    PR Interval:    QRS Duration:   QT Interval:    QTC Calculation:   R Axis:     Text Interpretation:        Laboratory Studies: No results found for this or any previous visit (from the past 24 hour(s)). Imaging Studies: Dg Hip Unilat W Or Wo Pelvis 2-3 Views Right  Result Date: 11/10/2018 CLINICAL DATA:  Fall, right hip pain EXAM: DG HIP (WITH OR WITHOUT PELVIS) 2-3V RIGHT COMPARISON:  None. FINDINGS: There is no evidence of hip fracture or dislocation. There is no evidence of arthropathy or other focal bone abnormality. IMPRESSION: Negative. Electronically Signed   By: Charlett NoseKevin  Dover M.D.   On: 11/10/2018 23:54    ED COURSE and MDM  Nursing notes and initial vitals signs, including pulse oximetry, reviewed.  Vitals:   11/10/18 2324 11/10/18 2325  BP: (!) 144/88   Pulse: 74   Resp: 16   Temp: 98 F (36.7 C)   TempSrc: Oral   SpO2: 98%   Weight:  134 kg  Height:  5\' 4"  (1.626 m)   Patient given Toradol IM.  Although she is not permitted NSAIDs due to gastric bypass surgery she does tolerate parenteral Toradol.  We will treat her pain and refer her to her PCP.  Although the pain is localized to her right hip the fact that it is exacerbated by movement at either hip suggests a lumbar radicular etiology.  PROCEDURES    ED DIAGNOSES     ICD-10-CM   1. Right hip pain  M25.551        Misty Foster, Jonny RuizJohn, MD 11/11/18 0002

## 2018-11-10 NOTE — ED Triage Notes (Signed)
C/o right hip pain x 1 day denies injury

## 2018-11-11 MED ORDER — HYDROMORPHONE HCL 1 MG/ML IJ SOLN
2.0000 mg | Freq: Once | INTRAMUSCULAR | Status: AC
  Administered 2018-11-11: 2 mg via INTRAMUSCULAR
  Filled 2018-11-11: qty 2

## 2018-11-11 MED ORDER — OXYCODONE-ACETAMINOPHEN 5-325 MG PO TABS: 1.0000 | ORAL_TABLET | ORAL | 0 refills | Status: AC | PRN

## 2018-11-11 MED ORDER — ONDANSETRON 8 MG PO TBDP
8.0000 mg | ORAL_TABLET | Freq: Once | ORAL | Status: AC
  Administered 2018-11-11: 8 mg via ORAL
  Filled 2018-11-11: qty 1

## 2018-11-16 ENCOUNTER — Emergency Department (HOSPITAL_BASED_OUTPATIENT_CLINIC_OR_DEPARTMENT_OTHER)
Admission: EM | Admit: 2018-11-16 | Discharge: 2018-11-16 | Discharge status: Against Medical Advice | Payer: 59 | Attending: Emergency Medicine | Admitting: Emergency Medicine

## 2018-11-16 ENCOUNTER — Other Ambulatory Visit: Payer: Self-pay

## 2018-11-16 DIAGNOSIS — J45909 Unspecified asthma, uncomplicated: Secondary | ICD-10-CM | POA: Insufficient documentation

## 2018-11-16 DIAGNOSIS — H9319 Tinnitus, unspecified ear: Secondary | ICD-10-CM | POA: Diagnosis not present

## 2018-11-16 DIAGNOSIS — R112 Nausea with vomiting, unspecified: Secondary | ICD-10-CM

## 2018-11-16 DIAGNOSIS — R42 Dizziness and giddiness: Secondary | ICD-10-CM | POA: Diagnosis not present

## 2018-11-16 DIAGNOSIS — R1033 Periumbilical pain: Secondary | ICD-10-CM | POA: Diagnosis not present

## 2018-11-16 DIAGNOSIS — Z79899 Other long term (current) drug therapy: Secondary | ICD-10-CM | POA: Insufficient documentation

## 2018-11-16 DIAGNOSIS — Z87891 Personal history of nicotine dependence: Secondary | ICD-10-CM | POA: Diagnosis not present

## 2018-11-16 DIAGNOSIS — Z5329 Procedure and treatment not carried out because of patient's decision for other reasons: Secondary | ICD-10-CM | POA: Diagnosis not present

## 2018-11-16 LAB — CBC WITH DIFFERENTIAL/PLATELET
Abs Immature Granulocytes: 0.01 10*3/uL (ref 0.00–0.07)
Basophils Absolute: 0 10*3/uL (ref 0.0–0.1)
Basophils Relative: 1 %
Eosinophils Absolute: 0.1 10*3/uL (ref 0.0–0.5)
Eosinophils Relative: 3 %
HCT: 37.8 % (ref 36.0–46.0)
Hemoglobin: 12.2 g/dL (ref 12.0–15.0)
Immature Granulocytes: 0 %
Lymphocytes Relative: 49 %
Lymphs Abs: 2.1 10*3/uL (ref 0.7–4.0)
MCH: 30.5 pg (ref 26.0–34.0)
MCHC: 32.3 g/dL (ref 30.0–36.0)
MCV: 94.5 fL (ref 80.0–100.0)
Monocytes Absolute: 0.3 10*3/uL (ref 0.1–1.0)
Monocytes Relative: 7 %
Neutro Abs: 1.7 10*3/uL (ref 1.7–7.7)
Neutrophils Relative %: 40 %
Platelets: 164 10*3/uL (ref 150–400)
RBC: 4 MIL/uL (ref 3.87–5.11)
RDW: 13.2 % (ref 11.5–15.5)
WBC: 4.2 10*3/uL (ref 4.0–10.5)
nRBC: 0 % (ref 0.0–0.2)

## 2018-11-16 LAB — URINALYSIS, ROUTINE W REFLEX MICROSCOPIC
Bilirubin Urine: NEGATIVE
Glucose, UA: NEGATIVE mg/dL
Hgb urine dipstick: NEGATIVE
Ketones, ur: NEGATIVE mg/dL
Leukocytes,Ua: NEGATIVE
Nitrite: NEGATIVE
Protein, ur: NEGATIVE mg/dL
Specific Gravity, Urine: 1.015 (ref 1.005–1.030)
pH: 6 (ref 5.0–8.0)

## 2018-11-16 LAB — COMPREHENSIVE METABOLIC PANEL
ALT: 18 U/L (ref 0–44)
AST: 22 U/L (ref 15–41)
Albumin: 3.7 g/dL (ref 3.5–5.0)
Alkaline Phosphatase: 95 U/L (ref 38–126)
Anion gap: 11 (ref 5–15)
BUN: 11 mg/dL (ref 6–20)
CO2: 24 mmol/L (ref 22–32)
Calcium: 9.1 mg/dL (ref 8.9–10.3)
Chloride: 105 mmol/L (ref 98–111)
Creatinine, Ser: 0.8 mg/dL (ref 0.44–1.00)
GFR calc Af Amer: >60 mL/min (ref >60)
GFR calc non Af Amer: >60 mL/min (ref >60)
Glucose, Bld: 91 mg/dL (ref 70–99)
Potassium: 3.7 mmol/L (ref 3.5–5.1)
Sodium: 140 mmol/L (ref 135–145)
Total Bilirubin: 0.4 mg/dL (ref 0.3–1.2)
Total Protein: 8.1 g/dL (ref 6.5–8.1)

## 2018-11-16 LAB — LIPASE, BLOOD: Lipase: 24 U/L (ref 11–51)

## 2018-11-16 LAB — PREGNANCY, URINE: Preg Test, Ur: NEGATIVE

## 2018-11-16 MED ORDER — SODIUM CHLORIDE 0.9 % IV BOLUS
1000.0000 mL | Freq: Once | INTRAVENOUS | Status: AC
  Administered 2018-11-16: 1000 mL via INTRAVENOUS

## 2018-11-16 MED ORDER — ONDANSETRON HCL 4 MG/2ML IJ SOLN
4.0000 mg | Freq: Once | INTRAMUSCULAR | Status: AC
  Administered 2018-11-16: 4 mg via INTRAVENOUS
  Filled 2018-11-16: qty 2

## 2018-11-16 NOTE — ED Triage Notes (Signed)
Patient arrived via POV c/o nausea and dizziness starting yesterday. Patient states she's had problems with her balance for several weeks. Patient states nausea is intermittent. Patient is AO x 4, gait was stable, with VS wdl.

## 2018-11-16 NOTE — ED Notes (Signed)
Pt resting quietly with eyes closed.  Reports some improvement in nausea, requesting crackers.

## 2018-11-16 NOTE — ED Notes (Signed)
Pt returned call from this morning, stated feeling much better, tolerating PO food/fluids today.  Pt reports she removed her own IV prior to leaving this morning.

## 2018-11-16 NOTE — ED Provider Notes (Signed)
[facility] EMERGENCY DEPARTMENT Provider Note   CSN: [id number] Arrival date & time: 11/16/18  0551    History   Chief Complaint Chief Complaint  Patient presents with  . Nausea  . Dizziness    HPI Misty Foster is a 48 y.o. female.   The history is provided by the patient.  Dizziness She has history of tuberous sclerosis, seizures, chronic back pain, hyperlipidemia and comes in because of nausea and vomiting since eating taco salad at about 1 AM.  She has vomited twice and continues to have nausea.  There is also crampy periumbilical pain.  Pain is rated at 7/10.  She denies fever, chills, sweats.  There has been no diarrhea.  She denies any sick contacts.  She also has been having tinnitus and dizziness over approximately the last 3 weeks.  She has noted that she has some intermittent difficulty with her balance during this time.  She denies any vertigo.  Nothing makes the dizziness worse, nothing seems to make it better.  She states that she does listen to a lot of loud music and had made an appointment with an audiologist, but had to cancel the appointment because of a work conflict.  She denies any ear pain.  Past Medical History:  Diagnosis Date  . Arthritis   . Asthma   . Back pain   . Chronic back pain   . Incomplete rotator cuff tear or rupture of left shoulder, not specified as traumatic 04/07/2017  . Seizures (HCC)   . Tuberous sclerosis [facility](HCC)     Patient Active Problem List   Diagnosis Date Noted  . Prediabetes 05/10/2018  . Hyperlipidemia 05/06/2018  . OSA (obstructive sleep apnea) 05/06/2018  . Seizure disorder (HCC) 05/06/2018  . Morbid obesity due to excess calories (HCC) 04/30/2018  . Incomplete rotator cuff tear or rupture of left shoulder, not specified as traumatic 04/07/2017  . Left rotator cuff tear 04/07/2017  . Left shoulder pain 01/01/2017  . Pain in right hip 11/10/2016  . Chronic right-sided low back pain without sciatica  11/10/2016  . 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 Neuropathy 06/20/2016  . Chronic back pain 05/01/2016  . Migraine without aura and without status migrainosus, not intractable 02/11/2015  . 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 ABLATION     cervical  . BIOPSY  05/14/2018   Procedure: BIOPSY;  Surgeon: Kathi DerBrahmbhatt, Parag, MD;  [facility]: [facility];  Service: Gastroenterology;;  . CESAREAN SECTION     x3  . ESOPHAGOGASTRODUODENOSCOPY (EGD) WITH PROPOFOL N/A 05/14/2018   Procedure: ESOPHAGOGASTRODUODENOSCOPY (EGD) WITH PROPOFOL;  Surgeon: Kathi DerBrahmbhatt, Parag, MD;  [facility];  Service: Gastroenterology;  Laterality: N/A;  . 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 SHOULDER ARTHROSCOPY WITH ROTATOR CUFF REPAIR Left 04/07/2017   Procedure: SHOULDER ARTHROSCOPY WITH ROTATOR CUFF REPAIR, ACROMIOPLASTY AND EXTENSIVE DEBRIDEMENT;  Surgeon: Teryl LucyLandau, Joshua, MD;  Location: [facility];  Service: Orthopedics;  Laterality: Left;     OB History    Gravida  4   Para  3   Term      Preterm      AB  1   Living        SAB      TAB      Ectopic      Multiple      Live Births  Home Medications    Prior to Admission medications   Medication Sig Start Date End Date Taking? Authorizing Provider  albuterol (VENTOLIN HFA) 108 (90 Base) MCG/ACT inhaler Inhale 2 puffs into the lungs every 6 (six) hours as needed for wheezing or shortness of breath.  09/13/15   [provider]  Ascorbic Acid (VITAMIN C) 1000 MG tablet Take 1,000 mg by mouth daily.    [provider]  cyclobenzaprine (FLEXERIL) 10 MG tablet Take 10 mg by mouth at bedtime.    [provider]  Multiple Vitamins-Minerals (MULTIVITAMIN WITH MINERALS) tablet Take 1 tablet by mouth daily.    [provider]  oxyCODONE-acetaminophen (PERCOCET) 5-325 MG tablet Take 1 tablet by mouth every 4 (four) hours as needed for severe pain. 11/11/18   Molpus, John, MD  PHENobarbital (LUMINAL) 97.2 MG tablet Take 1.5 tablets each evening 08/27/18   Cameron Sprang, MD  vitamin B-12 (CYANOCOBALAMIN) 1000 MCG tablet Take 1,000 mcg by mouth daily.    [provider]    Family History Family History  Problem Relation Age of Onset  . Breast cancer Maternal Grandmother   . Alcohol abuse Father   . Lung disease Father   . Epilepsy Father     Social History Social History   Tobacco Use  . Smoking status: Former Smoker    Years: 2.00    Types: Cigarettes    Quit date: 10/09/2017    Years since quitting: 1.1  . Smokeless tobacco: Never Used  Substance Use Topics  . Alcohol use: No  . Drug use: No     Allergies   Lamictal [lamotrigine], Lortab [hydrocodone-acetaminophen], Nsaids, and Gabapentin   Review of Systems Review of Systems  Neurological: Positive for dizziness.  All other systems reviewed and are negative.    Physical Exam Updated Vital Signs BP (!) 149/93 (BP Location: Right Arm)   Pulse (!) 58   Temp (!) 97.5 F (36.4 C) (Oral)   Resp 20   Ht 5\' 4"  (1.626 m)   Wt 132 kg   SpO2 100%   BMI 49.95 kg/m   Physical Exam Vitals signs and nursing note reviewed.    48 year old female, resting comfortably and in no acute distress. Vital signs are significant for elevated blood pressure. Oxygen saturation is 100%, which is normal. Head is normocephalic and atraumatic. PERRLA, EOMI. Oropharynx is clear.  Tympanic membranes are clear.  There is no nystagmus. Neck is nontender and supple without adenopathy or JVD. Back is nontender and there is no CVA tenderness. Lungs are clear without rales, wheezes, or rhonchi. Chest is nontender. Heart has regular rate and rhythm without murmur. Abdomen is soft, flat, with mild to moderate periumbilical tenderness.  There is no rebound  or guarding.  There are no masses or hepatosplenomegaly and peristalsis is hypoactive. Extremities have no cyanosis or edema, full range of motion is present. Skin is warm and dry without rash. Neurologic: Mental status is normal, cranial nerves are intact, there are no motor or sensory deficits.  Dizziness is not reproduced by passive head movement.  Finger-to-nose testing is normal.  Rapid alternating motion testing is normal.  Romberg is normal.  Gait in the emergency department is normal.  ED Treatments / Results  Labs (all labs ordered are listed, but only abnormal results are displayed) Labs Reviewed  CBC WITH DIFFERENTIAL/PLATELET  COMPREHENSIVE METABOLIC PANEL  LIPASE, BLOOD  URINALYSIS, ROUTINE W REFLEX MICROSCOPIC  PREGNANCY, URINE   Radiology No results  found.  Procedures Procedures Medications Ordered in ED Medications  ondansetron (ZOFRAN) injection 4 mg (has no administration in time range)  sodium chloride 0.9 % bolus 1,000 mL (has no administration in time range)     Initial Impression / Assessment and Plan / ED Course  I have reviewed the triage vital signs and the nursing notes.  Pertinent labs & imaging results that were available during my care of the patient were reviewed by me and considered in my medical decision making (see chart for details).  Abdominal pain, nausea, vomiting.  Etiologies include viral gastritis, food poisoning, biliary colic, peptic ulcer disease, pancreatitis.  She will be given IV fluids and ondansetron, screening labs obtained.  Dizziness which is not appear to be vertigo, associated with tinnitus.  She will need referral to ENT for outpatient evaluation.  Case is signed out to Dr. Rush Landmarkegeler pending lab results and to evaluate response to treatment.  Final Clinical Impressions(s) / ED Diagnoses   Final diagnoses:  Periumbilical abdominal pain  Non-intractable vomiting with nausea, unspecified vomiting type  Dizziness    ED Discharge  Orders    None       Dione BoozeGlick, Marquize Seib, MD 11/16/18 [phone]

## 2018-11-16 NOTE — ED Notes (Addendum)
Pt left prior to completion of discharge process.  Dr Sherry Ruffing made aware.  Call to patient left message to discuss test results and request return call  Per Dr Sherry Ruffing to discuss test results.

## 2018-11-16 NOTE — ED Provider Notes (Signed)
7:07 AM  Care assumed from Dr. Roxanne Mins.  At time of transfer of care, patient is awaiting results of diagnostic labs and reassessment for her nausea, vomiting, abdominal discomfort, and dizziness.  The previous team's plan is to let the patient go home with outpatient ENT referral if work-up is reassuring and patient is feeling better.  Prior to reassessment, patient eloped in the emergency department as she was feeling better.  Patient's work-up was overall reassuring and we are planning on discharging her anyway.  Patient eloped in stable condition.    Clinical Impression: 1. Periumbilical abdominal pain   2. Non-intractable vomiting with nausea, unspecified vomiting type   3. Dizziness     Disposition: Eloped  Condition: Good   Discharge Medication List as of 11/16/2018 10:01 AM      Follow Up: Rozetta Nunnery, MD [place] [id number] [phone]  Schedule an appointment as soon as possible for a visit    Annye English [place] [address] [phone]        Daryl Quiros, Gwenyth Allegra, MD 11/16/18 [phone]

## 2018-12-28 ENCOUNTER — Ambulatory Visit: Payer: 59 | Admitting: Allergy & Immunology

## 2019-01-13 ENCOUNTER — Ambulatory Visit: Payer: Managed Care, Other (non HMO) | Admitting: Allergy & Immunology

## 2019-02-14 ENCOUNTER — Ambulatory Visit: Payer: Self-pay | Admitting: Allergy

## 2019-02-14 NOTE — Progress Notes (Deleted)
New Patient Note  RE: Misty Foster MRN: [id number] DOB: 1970-09-30 Date of Office Visit: 02/14/2019  Referring provider: Annye English Primary care provider: Loyola Mast, PA-C  Chief Complaint: No chief complaint on file.  History of Present Illness: I had the pleasure of seeing Misty Foster for initial evaluation at [facility] on 02/14/2019. She is a 48 y.o. female, who is referred here by Loyola Mast, PA-C for the evaluation of rash.   Rash: Rash started about *** ago. Mainly occurs on her ***. Describes them as ***. Individual rashes lasts about ***. No ecchymosis upon resolution. Associated symptoms include: ***. Suspected triggers are ***. Denies any *** fevers, chills, changes in medications, foods, personal care products or recent infections. She has tried the following therapies: *** with *** benefit. Systemic steroids ***. Currently on ***.  Previous work up includes: ***. Previous history of rash/hives: ***. Patient is up to date with the following cancer screening tests: ***.  Assessment and Plan: Misty Foster is a 48 y.o. female with: No problem-specific Assessment & Plan notes found for this encounter.  No follow-ups on file.  No orders of the defined types were placed in this encounter.  Lab Orders  No laboratory test(s) ordered today    Other allergy screening: Asthma: {Blank single:19197::"yes","no"} Rhino conjunctivitis: {Blank single:19197::"yes","no"} Food allergy: {Blank single:19197::"yes","no"} Medication allergy: {Blank single:19197::"yes","no"} Hymenoptera allergy: {Blank single:19197::"yes","no"} Urticaria: {Blank single:19197::"yes","no"} Eczema:{Blank single:19197::"yes","no"} History of recurrent infections suggestive of immunodeficency: {Blank single:19197::"yes","no"}  Diagnostics: Spirometry:  Tracings reviewed. Her effort: {Blank single:19197::"Good reproducible efforts.","It was hard to  get consistent efforts and there is a question as to whether this reflects a maximal maneuver.","Poor effort, data can not be interpreted."} FVC: ***L FEV1: ***L, ***% predicted FEV1/FVC ratio: ***% Interpretation: {Blank single:19197::"Spirometry consistent with mild obstructive disease","Spirometry consistent with moderate obstructive disease","Spirometry consistent with severe obstructive disease","Spirometry consistent with possible restrictive disease","Spirometry consistent with mixed obstructive and restrictive disease","Spirometry uninterpretable due to technique","Spirometry consistent with normal pattern","No overt abnormalities noted given today's efforts"}.  Please see scanned spirometry results for details.  Skin Testing: {Blank single:19197::"Select foods","Environmental allergy panel","Environmental allergy panel and select foods","Food allergy panel","None","Deferred due to recent antihistamines use"}. Positive test to: ***. Negative test to: ***.  Results discussed with patient/family.   Past Medical History: Patient Active Problem List   Diagnosis Date Noted  . Prediabetes 05/10/2018  . Hyperlipidemia 05/06/2018  . OSA (obstructive sleep apnea) 05/06/2018  . Seizure disorder ([place]) 05/06/2018  . Morbid obesity due to excess calories ([place]) 04/30/2018  . Incomplete rotator cuff tear or rupture of left shoulder, not specified as traumatic 04/07/2017  . Left rotator cuff tear 04/07/2017  . Left shoulder pain 01/01/2017  . Pain in right hip 11/10/2016  . Chronic right-sided low back pain without sciatica 11/10/2016  . 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 Neuropathy 06/20/2016  . Chronic back pain 05/01/2016  . Migraine without aura and without status migrainosus, not intractable 02/11/2015  . 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 Arthritis   . Asthma   . Back pain   . Chronic back pain   . Incomplete rotator cuff tear or rupture of left shoulder, not specified as traumatic 04/07/2017  . Seizures ([place])   . Tuberous sclerosis [facility])    Past Surgical History: Past Surgical History:  Procedure Laterality Date  .  ABLATION     cervical  . BIOPSY  05/14/2018   Procedure: BIOPSY;  Surgeon: Kathi Der, MD;  [facility];  Service: Gastroenterology;;  . CESAREAN SECTION     x3  . ESOPHAGOGASTRODUODENOSCOPY (EGD) WITH PROPOFOL N/A 05/14/2018   Procedure: ESOPHAGOGASTRODUODENOSCOPY (EGD) WITH PROPOFOL;  Surgeon: Kathi Der, MD;  [facility];  Service: Gastroenterology;  Laterality: N/A;  . 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 SHOULDER ARTHROSCOPY WITH ROTATOR CUFF REPAIR Left 04/07/2017   Procedure: SHOULDER ARTHROSCOPY WITH ROTATOR CUFF REPAIR, ACROMIOPLASTY AND EXTENSIVE DEBRIDEMENT;  Surgeon: Teryl Lucy, MD;  Location: [facility] OR;  Service: Orthopedics;  Laterality: Left;   Medication List:  Current Outpatient Medications  Medication Sig Dispense Refill  . albuterol (VENTOLIN HFA) 108 (90 Base) MCG/ACT inhaler Inhale 2 puffs into the lungs every 6 (six) hours as needed for wheezing or shortness of breath.     . Ascorbic Acid (VITAMIN C) 1000 MG tablet Take 1,000 mg by mouth daily.    . cyclobenzaprine (FLEXERIL) 10 MG tablet Take 10 mg by mouth at bedtime.    . Multiple Vitamins-Minerals (MULTIVITAMIN WITH MINERALS) tablet Take 1 tablet by mouth daily.    Marland Kitchen oxyCODONE-acetaminophen (PERCOCET) 5-325 MG tablet Take 1 tablet by mouth every 4 (four) hours as needed for severe pain. 20 tablet 0  . PHENobarbital (LUMINAL) 97.2 MG tablet Take 1.5 tablets each evening 135 tablet 3  . vitamin B-12 (CYANOCOBALAMIN) 1000 MCG tablet Take 1,000 mcg by mouth daily.     No current facility-administered medications for this visit.    Allergies: Allergies  Allergen Reactions   . Lamictal [Lamotrigine] Nausea And Vomiting and Rash  . Lortab [Hydrocodone-Acetaminophen] Other (See Comments)    Auras  . Nsaids   . Gabapentin Other (See Comments)    auras   Social History: Social History   Socioeconomic History  . Marital status: Legally Separated    Spouse name: Not on file  . Number of children: Not on file  . Years of education: Not on file  . Highest education level: Not on file  Occupational History  . Not on file  Social Needs  . Financial resource strain: Not on file  . Food insecurity    Worry: Not on file    Inability: Not on file  . Transportation needs    Medical: Not on file    Non-medical: Not on file  Tobacco Use  . Smoking status: Former Smoker    Years: 2.00    Types: Cigarettes    Quit date: 10/09/2017    Years since quitting: 1.3  . Smokeless tobacco: Never Used  Substance and Sexual Activity  . Alcohol use: No  . Drug use: No  . Sexual activity: Yes  Lifestyle  . Physical activity    Days per week: Not on file    Minutes per session: Not on file  . Stress: Not on file  Relationships  . Social Musician on phone: Not on file    Gets together: Not on file    Attends religious service: Not on file    Active member of club or organization: Not on file    Attends meetings of clubs or organizations: Not on file    Relationship status: Not on file  Other Topics Concern  . Not on file  Social History Narrative  . Not on file   Lives in a ***. Smoking: *** Occupation: ***  Environmental History: Water Damage/mildew in the  house: {Blank single:19197::"yes","no"} Carpet in the family room: {Blank single:19197::"yes","no"} Carpet in the bedroom: {Blank single:19197::"yes","no"} Heating: {Blank single:19197::"electric","gas"} Cooling: {Blank single:19197::"central","window"} Pet: {Blank single:19197::"yes ***","no"}  Family History: Family History  Problem Relation Age of Onset  . Breast cancer Maternal  Grandmother   . Alcohol abuse Father   . Lung disease Father   . Epilepsy Father    Problem                               Relation Asthma                                   *** Eczema                                *** Food allergy                          *** Allergic rhino conjunctivitis     ***  Review of Systems  Constitutional: Negative for appetite change, chills, fever and unexpected weight change.  HENT: Negative for congestion and rhinorrhea.   Eyes: Negative for itching.  Respiratory: Negative for cough, chest tightness, shortness of breath and wheezing.   Cardiovascular: Negative for chest pain.  Gastrointestinal: Negative for abdominal pain.  Genitourinary: Negative for difficulty urinating.  Skin: Negative for rash.  Allergic/Immunologic: Negative for environmental allergies and food allergies.  Neurological: Negative for headaches.   Objective: There were no vitals taken for this visit. There is no height or weight on file to calculate BMI. Physical Exam  Constitutional: She is oriented to person, place, and time. She appears well-developed and well-nourished.  HENT:  Head: Normocephalic and atraumatic.  Right Ear: External ear normal.  Left Ear: External ear normal.  Nose: Nose normal.  Mouth/Throat: Oropharynx is clear and moist.  Eyes: Conjunctivae and EOM are normal.  Neck: Neck supple.  Cardiovascular: Normal rate, regular rhythm and normal heart sounds. Exam reveals no gallop and no friction rub.  No murmur heard. Pulmonary/Chest: Effort normal and breath sounds normal. She has no wheezes. She has no rales.  Abdominal: Soft.  Neurological: She is alert and oriented to person, place, and time.  Skin: Skin is warm. No rash noted.  Psychiatric: She has a normal mood and affect. Her behavior is normal.  Nursing note and vitals reviewed.  The plan was reviewed with the patient/family, and all questions/concerned were addressed.  It was my pleasure to see  Jaclene today and participate in her care. Please feel free to contact me with any questions or concerns.  Sincerely,  Wyline MoodYoon Rashaunda Rahl, DO Allergy & Immunology  [facility] [place] office: [phone] [facility] office: [phone] [place] office: [phone]

## 2019-03-04 ENCOUNTER — Other Ambulatory Visit: Payer: Self-pay

## 2019-03-04 DIAGNOSIS — G40109 Localization-related (focal) (partial) symptomatic epilepsy and epileptic syndromes with simple partial seizures, not intractable, without status epilepticus: Secondary | ICD-10-CM

## 2019-03-04 MED ORDER — PHENOBARBITAL 97.2 MG PO TABS: ORAL_TABLET | ORAL | 3 refills | Status: DC

## 2019-03-07 ENCOUNTER — Telehealth: Payer: Self-pay | Admitting: Neurology

## 2019-03-07 NOTE — Telephone Encounter (Signed)
Rx printed on Friday. Called in prescription today. Pt made aware.

## 2019-03-07 NOTE — Telephone Encounter (Signed)
Patient left msg with after hours about needing a refill on her medication. Patient tried to get a loaner but due to it being a controlled substance she can not get it filled. Patient indicated she has one pill left. Dosage is 1.5 tabs today.

## 2019-03-07 NOTE — Telephone Encounter (Signed)
Patient called the office back regarding her Phenobarbital medication. She has been out of her medication all weekend. She would like the refill sent to [address] [facility]. Please Call [phone]. Thank you

## 2019-03-11 ENCOUNTER — Telehealth: Payer: Self-pay | Admitting: Neurology

## 2019-03-11 NOTE — Telephone Encounter (Signed)
Patient is calling in wanting to speak with the nurse regarding some issues with her medication and the pharm. She is having issues with switching pharms and then she doesn't have any medication for the weekend and she didn't have any last weekend so now it is causing her problems. Thanks!

## 2019-03-11 NOTE — Telephone Encounter (Signed)
I believe this was sent to me in error.  I am not her treating physician. Thanks.

## 2019-03-11 NOTE — Telephone Encounter (Signed)
Patient called and said she really needs this medication, Phenobarbital 97.2 MG before the weekend. Patient stated her pharmacy does not have this strength but they do have other strengths.  [facility] on [address] in [place]

## 2019-03-11 NOTE — Telephone Encounter (Signed)
Patient aware of the supply and to call back on Monday, thank you

## 2019-03-14 ENCOUNTER — Telehealth: Payer: Self-pay | Admitting: Neurology

## 2019-03-14 ENCOUNTER — Telehealth: Payer: Self-pay

## 2019-03-14 NOTE — Telephone Encounter (Signed)
Left message for pt to return call. Did she get the medication?

## 2019-03-14 NOTE — Telephone Encounter (Signed)
Patient left a message and returned a call to [place], see closed telephone encounter. She said she was able to get the medication, phenobarbital 97.2 MG, in [place].

## 2019-03-26 ENCOUNTER — Emergency Department (HOSPITAL_BASED_OUTPATIENT_CLINIC_OR_DEPARTMENT_OTHER)
Admission: EM | Admit: 2019-03-26 | Discharge: 2019-03-26 | Disposition: A | Discharge status: Home / Self Care | Payer: Managed Care, Other (non HMO) | Attending: Emergency Medicine | Admitting: Emergency Medicine

## 2019-03-26 ENCOUNTER — Encounter (HOSPITAL_BASED_OUTPATIENT_CLINIC_OR_DEPARTMENT_OTHER): Payer: Self-pay

## 2019-03-26 ENCOUNTER — Other Ambulatory Visit: Payer: Self-pay

## 2019-03-26 DIAGNOSIS — J45909 Unspecified asthma, uncomplicated: Secondary | ICD-10-CM | POA: Insufficient documentation

## 2019-03-26 DIAGNOSIS — M549 Dorsalgia, unspecified: Secondary | ICD-10-CM

## 2019-03-26 DIAGNOSIS — Z79899 Other long term (current) drug therapy: Secondary | ICD-10-CM | POA: Insufficient documentation

## 2019-03-26 DIAGNOSIS — M5489 Other dorsalgia: Secondary | ICD-10-CM | POA: Insufficient documentation

## 2019-03-26 DIAGNOSIS — Z87891 Personal history of nicotine dependence: Secondary | ICD-10-CM | POA: Insufficient documentation

## 2019-03-26 MED ORDER — METHOCARBAMOL 500 MG PO TABS: 500.0000 mg | ORAL_TABLET | Freq: Three times a day (TID) | ORAL | 0 refills | Status: AC

## 2019-03-26 MED ORDER — LIDOCAINE 5 % EX PTCH: 1.0000 | MEDICATED_PATCH | CUTANEOUS | 0 refills | Status: AC

## 2019-03-26 MED ORDER — KETOROLAC TROMETHAMINE 15 MG/ML IJ SOLN
15.0000 mg | Freq: Once | INTRAMUSCULAR | Status: AC
  Administered 2019-03-26: 15 mg via INTRAMUSCULAR
  Filled 2019-03-26: qty 1

## 2019-03-26 NOTE — ED Notes (Signed)
ED Provider at bedside. 

## 2019-03-26 NOTE — ED Triage Notes (Addendum)
Pt c/o back pain radiating into both hips and right shoulder. Pt reports that she had gastric bypass surgery in February.

## 2019-03-26 NOTE — Discharge Instructions (Addendum)
You have been diagnosed today with acute on chronic back pain.  At this time there does not appear to be the presence of an emergent medical condition, however there is always the potential for conditions to change. Please read and follow the below instructions.  Please return to the Emergency Department immediately for any new or worsening symptoms. Please be sure to follow up with your Primary Care Provider within one week regarding your visit today; please call their office to schedule an appointment even if you are feeling better for a follow-up visit. You may call your orthopedist to schedule a follow-up appointment regarding your back pain.  If you are unable to get in touch with your former orthopedist you may call the orthopedic specialist Dr. Ninfa Linden on your discharge paperwork to schedule a follow-up appointment. You have been given an NSAID-containing medication called Toradol today.  Do not take the medications including ibuprofen, Aleve, Advil, naproxen or other NSAID-containing medications for the next 2 days.  Please be sure to drink plenty of water over the next few days. You may use the muscle relaxer Robaxin as prescribed to help with your symptoms.  Do not drive or operate heavy machinery while taking Robaxin as it will make you drowsy.  Do not drink alcohol or take other sedating medications while taking Robaxin as this will worsen side effects. You may use the Lidoderm patches as prescribed to you today to help with your symptoms. You may continue taking Tylenol as directed on the packaging to help with your symptoms.  Get help right away if: One or both of your arms or legs feel weak. One or both of your arms or legs lose feeling (numbness). You have trouble controlling when you poop (bowel movement) or pee (urinate). You feel sick to your stomach (nauseous). You throw up (vomit). You have belly (abdominal) pain. You have shortness of breath. You have fever or chills You  pass out (faint). You have any new, concerning or worsening of symptoms  Please read the additional information packets attached to your discharge summary.  Do not take your medicine if  develop an itchy rash, swelling in your mouth or lips, or difficulty breathing; call 911 and seek immediate emergency medical attention if this occurs.  Note: Portions of this text may have been transcribed using voice recognition software. Every effort was made to ensure accuracy; however, inadvertent computerized transcription errors may still be present.

## 2019-03-26 NOTE — ED Provider Notes (Signed)
[facility] EMERGENCY DEPARTMENT Provider Note   CSN: [id number] Arrival date & time: 03/26/19  0920     History   Chief Complaint Chief Complaint  Patient presents with  . Back Pain    HPI Misty Foster is a 48 y.o. 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  Patient reports exacerbation of her chronic back pain for the past 2 days, she describes a severe throbbing sharp sensation in her bilateral sacroiliac joints and right trapezius muscle, nonradiating worsened with movement and palpation no alleviating factors, she is attempted 3 Tylenol prior to arrival without relief.  She reports that she recently began a new job and carries multiple bags around which are heavy.  She believes that this may have exacerbated her pain.  She reports that since her gastric bypass surgery in 2020 she has had improvement of her chronic back pain but did have 2 ED visits in July of this year for similar, she reports she was given Toradol intramuscular during 1 of these visits which helped greatly she is requesting this medication today.  She denies fever/chills, fall/injury, headache, neck pain, chest pain/shortness of breath, abdominal pain, nausea/vomiting, diarrhea, dysuria/hematuria, bowel/bladder incontinence, IV drug abuse, numbness/tingling, weakness, retention or any additional concerns today.  Of note patient denies chance of pregnancy she reports tubal ligations in 1992.  She does not want a pregnancy test today.     HPI  Past Medical History:  Diagnosis Date  . Arthritis   . Asthma   . Back pain   . Chronic back pain   . Incomplete rotator cuff tear or rupture of left shoulder, not specified as traumatic 04/07/2017  . Seizures ([place])   . Tuberous sclerosis [facility])     Patient Active Problem List   Diagnosis Date Noted  . Prediabetes 05/10/2018  . Hyperlipidemia 05/06/2018  . OSA  (obstructive sleep apnea) 05/06/2018  . Seizure disorder ([place]) 05/06/2018  . Morbid obesity due to excess calories ([place]) 04/30/2018  . Incomplete rotator cuff tear or rupture of left shoulder, not specified as traumatic 04/07/2017  . Left rotator cuff tear 04/07/2017  . Left shoulder pain 01/01/2017  . Pain in right hip 11/10/2016  . Chronic right-sided low back pain without sciatica 11/10/2016  . 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 Neuropathy 06/20/2016  . Chronic back pain 05/01/2016  . Migraine without aura and without status migrainosus, not intractable 02/11/2015  . 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 ABLATION     cervical  . BIOPSY  05/14/2018   Procedure: BIOPSY;  Surgeon: Otis Brace, MD;  Location: [facility];  Service: Gastroenterology;;  . CESAREAN SECTION     x3  . ESOPHAGOGASTRODUODENOSCOPY (EGD) WITH PROPOFOL N/A 05/14/2018   Procedure: ESOPHAGOGASTRODUODENOSCOPY (EGD) WITH PROPOFOL;  Surgeon: Otis Brace, MD;  Location: [facility];  Service: Gastroenterology;  Laterality: N/A;  . 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 SHOULDER ARTHROSCOPY WITH ROTATOR CUFF REPAIR Left 04/07/2017   Procedure: SHOULDER ARTHROSCOPY WITH ROTATOR CUFF REPAIR, ACROMIOPLASTY AND EXTENSIVE DEBRIDEMENT;  Surgeon: Marchia Bond, MD;  Location: [facility];  Service: Orthopedics;  Laterality: Left;     OB History    Gravida  4   Para  3   Term      Preterm      AB  1  Living        SAB      TAB      Ectopic      Multiple      Live Births               Home Medications    Prior to Admission medications   Medication Sig Start Date End Date Taking? Authorizing Provider  albuterol (VENTOLIN HFA) 108 (90 Base) MCG/ACT inhaler Inhale 2 puffs into the lungs every 6 (six) hours as needed for  wheezing or shortness of breath.  09/13/15   [provider]  Ascorbic Acid (VITAMIN C) 1000 MG tablet Take 1,000 mg by mouth daily.    [provider]  cyclobenzaprine (FLEXERIL) 10 MG tablet Take 10 mg by mouth at bedtime.    [provider]  lidocaine (LIDODERM) 5 % Place 1 patch onto the skin daily. Remove & Discard patch within 12 hours or as directed by MD 03/26/19   Bill Salinas, PA-C  methocarbamol (ROBAXIN) 500 MG tablet Take 1 tablet (500 mg total) by mouth 3 (three) times daily. 03/26/19   Harlene Salts A, PA-C  Multiple Vitamins-Minerals (MULTIVITAMIN WITH MINERALS) tablet Take 1 tablet by mouth daily.    [provider]  oxyCODONE-acetaminophen (PERCOCET) 5-325 MG tablet Take 1 tablet by mouth every 4 (four) hours as needed for severe pain. 11/11/18   Molpus, John, MD  PHENobarbital (LUMINAL) 97.2 MG tablet Take 1.5 tablets each evening 03/04/19   Van Clines, MD  vitamin B-12 (CYANOCOBALAMIN) 1000 MCG tablet Take 1,000 mcg by mouth daily.    [provider]    Family History Family History  Problem Relation Age of Onset  . Breast cancer Maternal Grandmother   . Alcohol abuse Father   . Lung disease Father   . Epilepsy Father     Social History Social History   Tobacco Use  . Smoking status: Former Smoker    Packs/day: 0.50    Types: Cigarettes  . Smokeless tobacco: Never Used  Substance Use Topics  . Alcohol use: No  . Drug use: No     Allergies   Lamictal [lamotrigine], Lortab [hydrocodone-acetaminophen], Nsaids, and Gabapentin   Review of Systems Review of Systems Ten systems are reviewed and are negative for acute change except as noted in the HPI   Physical Exam Updated Vital Signs BP 129/83 (BP Location: Right Arm)   Pulse 60   Temp 97.6 F (36.4 C) (Oral)   Resp 20   Ht 5\' 4"  (1.626 m)   Wt 120.2 kg   SpO2 99%   BMI 45.49 kg/m   Physical Exam Constitutional:      General: She is not in  acute distress.    Appearance: Normal appearance. She is obese. She is not ill-appearing or diaphoretic.  HENT:     Head: Normocephalic and atraumatic. No raccoon eyes or Battle's sign.     Jaw: There is normal jaw occlusion. No trismus.     Right Ear: External ear normal.     Left Ear: External ear normal.     Nose: Nose normal.  Eyes:     General: Vision grossly intact. Gaze aligned appropriately.     Extraocular Movements: Extraocular movements intact.     Conjunctiva/sclera: Conjunctivae normal.     Pupils: Pupils are equal, round, and reactive to light.  Neck:     Musculoskeletal: Normal range of motion and neck supple. Muscular tenderness present. No neck rigidity.  Trachea: Trachea and phonation normal. No tracheal tenderness or tracheal deviation.      Comments: Right trapezius tenderness to palpation. Cardiovascular:     Rate and Rhythm: Normal rate and regular rhythm.     Pulses:          Radial pulses are 2+ on the right side and 2+ on the left side.       Dorsalis pedis pulses are 2+ on the right side and 2+ on the left side.  Pulmonary:     Effort: Pulmonary effort is normal. No accessory muscle usage or respiratory distress.     Breath sounds: Normal air entry.  Chest:     Chest wall: No tenderness.  Abdominal:     General: Bowel sounds are normal. There is no distension.     Palpations: Abdomen is soft. There is no pulsatile mass.     Tenderness: There is no abdominal tenderness. There is no right CVA tenderness, left CVA tenderness, guarding or rebound.  Musculoskeletal:       Back:     Comments: No midline C/T/L spinal tenderness to palpation no deformity, crepitus, or step-off noted. No sign of injury to the neck or back. - Reproducible tenderness of the right trapezius muscle and bilateral gluteus medius/SI joints.  Feet:     Right foot:     Protective Sensation: 3 sites tested. 3 sites sensed.     Left foot:     Protective Sensation: 3 sites tested. 3  sites sensed.  Skin:    General: Skin is warm and dry.     Capillary Refill: Capillary refill takes less than 2 seconds.  Neurological:     Mental Status: She is alert.     GCS: GCS eye subscore is 4. GCS verbal subscore is 5. GCS motor subscore is 6.     Comments: Speech is clear and goal oriented, follows commands Major Cranial nerves without deficit, no facial droop Normal strength in upper and lower extremities bilaterally including dorsiflexion and plantar flexion, strong and equal grip strength Sensation normal to light and sharp touch Moves extremities without ataxia, coordination intact Reflexes 1+ equal bilateral patella, no clonus of the feet  Psychiatric:        Behavior: Behavior is cooperative.      ED Treatments / Results  Labs (all labs ordered are listed, but only abnormal results are displayed) Labs Reviewed - No data to display  EKG None  Radiology No results found.  Procedures Procedures (including critical care time)  Medications Ordered in ED Medications  ketorolac (TORADOL) 15 MG/ML injection 15 mg (15 mg Intramuscular Given 03/26/19 0947)     Initial Impression / Assessment and Plan / ED Course  I have reviewed the triage vital signs and the nursing notes.  Pertinent labs & imaging results that were available during my care of the patient were reviewed by me and considered in my medical decision making (see chart for details).    Misty Foster is a 48 y.o. female presenting with bilateral back pain x2 days.  She describes it as an exacerbation of her typical back pain.  She denies any history of trauma, fever, IV drug use, night sweats, weight loss not associated with her recent gastric bypass, cancer, saddle area paresthesias, urinary retention, bowel/bladder incontinence.  She has a normal neuro exam without deficit.  Suspect patient with chronic bilateral muscular back pain without sciatica. Her pain is consistently reproducible of the  musculature of the back,  she has no midline tenderness.  Her abdomen is soft/nontender and without pulsatile mass.  Doubt spinal epidural abscess, cauda equina, AAA or other acute pathologies at this time.  There is no imaging indicated at this time.  She is ambulatory in the emergency department without assistance, discussed rice protocol with the patient.  She is requesting Toradol for her symptoms and reports that has worked well with her before, will give 15 mg intramuscular.  She denies history of CKD, no history of gastric ulcers, will need to avoid oral NSAIDs due to history of gastric bypass. Of note patient denies chance of pregnancy today, she reports that she had tubal ligation in 1992.  Patient states understanding that medications given or prescribed today may result in harm to of a pregnancy and she accepts these risks and still chooses not to be pregnancy tested and proceed with medications.  Additionally will prescribe Robaxin 500 mg twice daily, patient informed not to drive or perform dangerous activities or drink alcohol while taking muscle relaxer.  Patient has PCP and orthopedist to follow-up with.  At this time there does not appear to be any evidence of an acute emergency medical condition and the patient appears stable for discharge with appropriate outpatient follow up. Diagnosis was discussed with patient who verbalizes understanding of care plan and is agreeable to discharge. I have discussed return precautions with patient who verbalizes understanding of return precautions. Patient encouraged to follow-up with their PCP and ortho. All questions answered.  Patient has been discharged in good condition.  Note: Portions of this report may have been transcribed using voice recognition software. Every effort was made to ensure accuracy; however, inadvertent computerized transcription errors may still be present. Final Clinical Impressions(s) / ED Diagnoses   Final diagnoses:  Bilateral  back pain, unspecified back location, unspecified chronicity    ED Discharge Orders         Ordered    lidocaine (LIDODERM) 5 %  Every 24 hours     03/26/19 1002    methocarbamol (ROBAXIN) 500 MG tablet  3 times daily     03/26/19 [address]           Wendolyn Raso A, PA-C 03/26/19 1004    Jacalyn LefevreHaviland, Julie, MD 03/26/19 1124

## 2019-04-30 ENCOUNTER — Encounter (HOSPITAL_BASED_OUTPATIENT_CLINIC_OR_DEPARTMENT_OTHER): Payer: Self-pay | Admitting: Emergency Medicine

## 2019-04-30 ENCOUNTER — Emergency Department (HOSPITAL_BASED_OUTPATIENT_CLINIC_OR_DEPARTMENT_OTHER)
Admission: EM | Admit: 2019-04-30 | Discharge: 2019-04-30 | Disposition: A | Discharge status: Home / Self Care | Payer: Self-pay | Attending: Emergency Medicine | Admitting: Emergency Medicine

## 2019-04-30 ENCOUNTER — Other Ambulatory Visit: Payer: Self-pay

## 2019-04-30 DIAGNOSIS — K469 Unspecified abdominal hernia without obstruction or gangrene: Secondary | ICD-10-CM | POA: Insufficient documentation

## 2019-04-30 DIAGNOSIS — Z5321 Procedure and treatment not carried out due to patient leaving prior to being seen by health care provider: Secondary | ICD-10-CM | POA: Insufficient documentation

## 2019-04-30 LAB — URINALYSIS, ROUTINE W REFLEX MICROSCOPIC
Bilirubin Urine: NEGATIVE
Glucose, UA: NEGATIVE mg/dL
Hgb urine dipstick: NEGATIVE
Ketones, ur: NEGATIVE mg/dL
Leukocytes,Ua: NEGATIVE
Nitrite: NEGATIVE
Protein, ur: NEGATIVE mg/dL
Specific Gravity, Urine: 1.01 (ref 1.005–1.030)
pH: 6.5 (ref 5.0–8.0)

## 2019-04-30 IMAGING — XA DG FLUORO GUIDE NDL PLC/BX
2 series · 2 of 2 positions shown · IV contrast (multihance)
Comparison: none

CLINICAL DATA: Left shoulder pain.

EXAM:
LEFT SHOULDER INJECTION UNDER FLUOROSCOPY
TECHNIQUE: An appropriate skin entrance site was determined. The site was
marked, prepped with Betadine, draped in the usual sterile fashion,
and infiltrated locally with buffered Lidocaine. A 22 gauge spinal
needle was advanced to the superomedial margin of the humeral head
under intermittent fluoroscopy. 1 mL of 1% lidocaine injected
easily. A mixture of 0.05 mL of MultiHance, 5 mL of 1% lidocaine,
and 15 mL of Isovue-M 200 was then used to opacify the left shoulder
capsule. 14 mL of this mixture were injected. No immediate
complication.
FLUOROSCOPY TIME:  Fluoroscopy Time:  7 seconds
Radiation Exposure Index (if provided by the fluoroscopic device):
33.03 microGray*m^2
Number of Acquired Spot Images: 0

[Series 1: ortho adipose · 1 of 1 slices shown (1 of 2)]
[im 1/1]
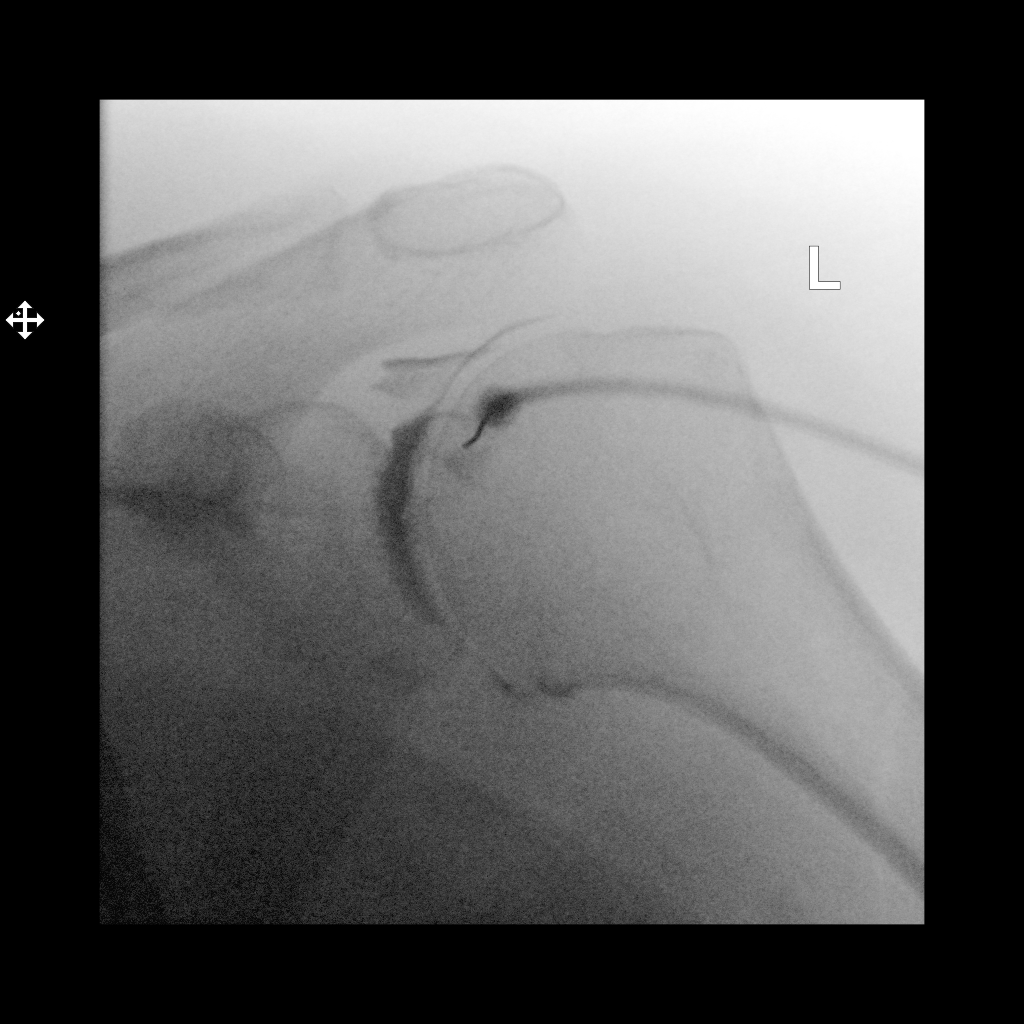

[Series 2: ortho adipose · 1 of 1 slices shown (2 of 2)]
[im 1/1]
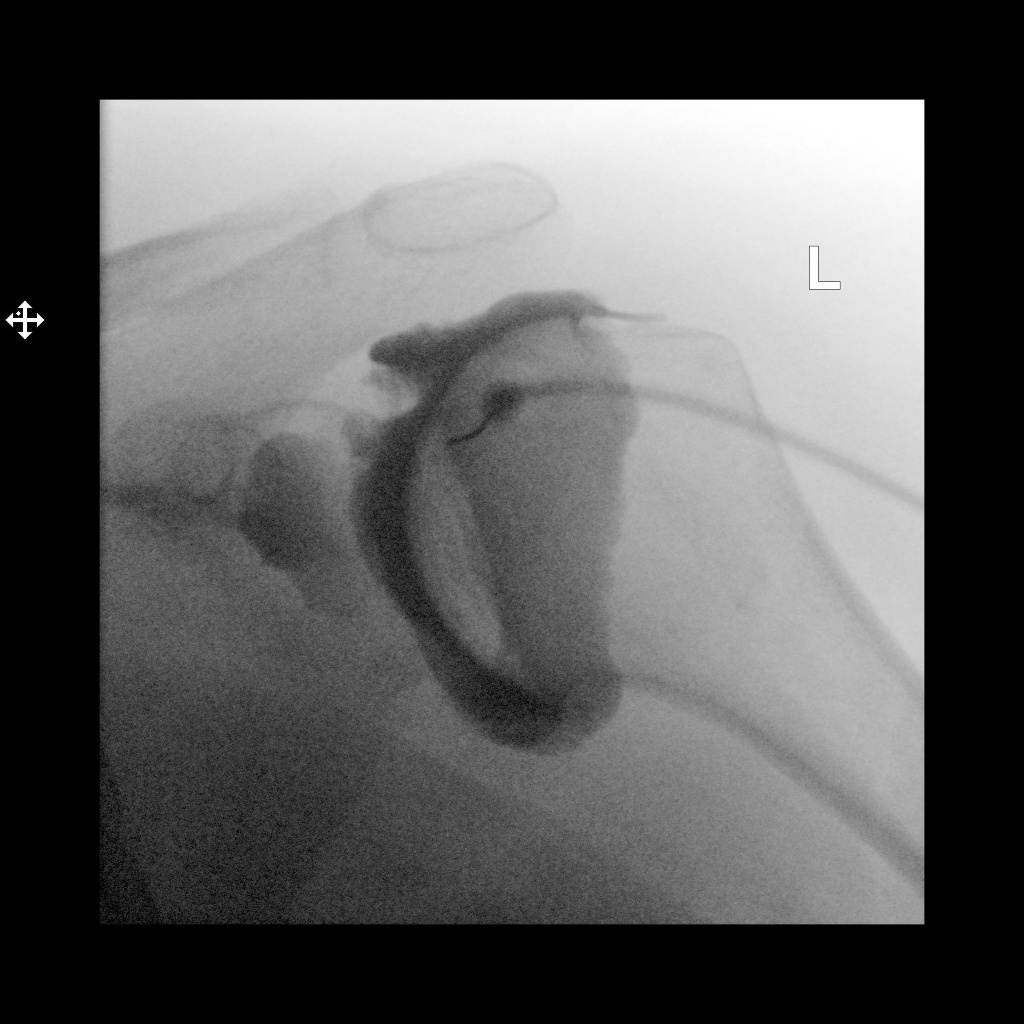

[2 of 2 positions shown; findings below may reference images not displayed]

IMPRESSION: Technically successful left shoulder injection for MRI.

## 2019-04-30 NOTE — ED Notes (Signed)
Pt refused blood work. Pt states it is noisy and she is irritated with the wait. Pt states she is leaving.

## 2019-04-30 NOTE — ED Triage Notes (Signed)
C/o "hernia flare up" x 1 hour. States it is happening more frequently.

## 2019-07-20 ENCOUNTER — Other Ambulatory Visit: Payer: Self-pay | Admitting: Internal Medicine

## 2019-07-20 DIAGNOSIS — Z1231 Encounter for screening mammogram for malignant neoplasm of breast: Secondary | ICD-10-CM

## 2019-08-01 ENCOUNTER — Ambulatory Visit: Payer: Self-pay

## 2019-08-02 ENCOUNTER — Ambulatory Visit: Payer: Self-pay

## 2019-08-29 ENCOUNTER — Encounter: Payer: Self-pay | Admitting: Neurology

## 2019-08-29 ENCOUNTER — Other Ambulatory Visit: Payer: Self-pay

## 2019-08-29 ENCOUNTER — Ambulatory Visit (INDEPENDENT_AMBULATORY_CARE_PROVIDER_SITE_OTHER): Payer: Managed Care, Other (non HMO) | Admitting: Neurology

## 2019-08-29 VITALS — BP 99/71 | HR 94 | Resp 20 | Ht 64.0 in | Wt 240.0 lb

## 2019-08-29 DIAGNOSIS — G40109 Localization-related (focal) (partial) symptomatic epilepsy and epileptic syndromes with simple partial seizures, not intractable, without status epilepticus: Secondary | ICD-10-CM | POA: Diagnosis not present

## 2019-08-29 DIAGNOSIS — Z79899 Other long term (current) drug therapy: Secondary | ICD-10-CM | POA: Diagnosis not present

## 2019-08-29 MED ORDER — PHENOBARBITAL 97.2 MG PO TABS: ORAL_TABLET | ORAL | 3 refills | Status: DC

## 2019-08-29 NOTE — Progress Notes (Signed)
NEUROLOGY FOLLOW UP OFFICE NOTE  Misty Foster [id number] 07/11/1970  HISTORY OF PRESENT ILLNESS: I had the pleasure of seeing Misty Foster in follow-up in the neurology clinic on 08/29/2019.  The patient was last seen a year ago for well-controlled seizures. She continues to do well seizure-free since June 2017 on Phenobarbital 97.2mg  1.5 tab daily without side effects. She denies any staring/unresponsive episodes, gaps in time, olfactory/gustatory hallucinations, focal numbness/tingling/weakness (except for occasional carpal tunnel syndrome symptoms), myoclonic jerks. No headaches, dizziness, vision changes, no falls. Sleep is good. Mood is okay. She lives alone. She reports work is very stressful. She takes daily calcium with vitamin D, in addition to all her other vitamins post gastric bypass. Last bone density scan in 2018 was normal.   History On Initial Assessment 06/17/2016: This is a pleasant 49 yo RH woman with seizures since age 49 that she described as focal, then had her first and only generalized tonic-clonic seizure at age 27. She had been living in [place] and seeing the [facility] at [place], records were reviewed. 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 EEG in February 2013 reported as normal. She describes her focal seizures as starting with an aura where she sees herself across the room. Then either arm would start having a tremor, sometimes starting from the forearm and traveling up to her shoulder lasting a few seconds. She would have speech difficulties and feel diffusely weak after. She denies any confusion, stating she had an episode of confusion a long time ago that was attributed to an over the counter medication, she would wake up "disillusioned," not knowing where she was. She  states Benadryl and Splenda caused issues where she woke up and could not see, feeling disoriented. This occurred before June 2017 (when she left [place]). She has been on Phenobarbital since the beginning ("I love my Phenobarbital"), she has tried different medications but would have reactions to all of them. She recalls taking Dilantin, then Tegretol, which both worked great. She tried Lamictal, Gabapentin (dizzy/lightheaded), and Topamax (started losing vision), which did not help. She took Trileptal intermittently but it caused forgetfulness. She reports staring spells with some of her seizures, this is infrequent and has not been happening recently. She denies any olfactory/gustatory hallucinations, deja vu, rising epigastric sensation, focal numbness/tingling/weakness, myoclonic jerks. She has paresthesias in her hands and feet and states that blood tests have been done in the past. She has been on current dose of Phenobarbital for the past 3-4 years without side effects. She denies any headaches, dizziness, diplopia, bowel/bladder dysfunction. She has chronic pain in her neck down her spine, spasms across her abdomen, knee pain. She reports a history of cervical radiculopathy and right rotator cuff injury.   Epilepsy Risk Factors:  Her father had seizures when he was younger. She had bacterial meningitis in 1994. Otherwise she had a normal birth and early development.  There is no history of febrile convulsions, significant traumatic brain injury, neurosurgical procedures, or family history of seizures.  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 EEG in February 2013 reported as normal.  Bone density scan 06/2016: normal Phenobarbital levels: 04/2012 was 10.2, 05/1999 was 22.1   PAST MEDICAL HISTORY: Past  Medical History:  Diagnosis Date  . Arthritis   . Asthma   . Back pain   . Chronic back pain   . Incomplete rotator cuff tear or rupture of left shoulder, not specified as traumatic 04/07/2017  . Seizures ([facility])   . Tuberous sclerosis ([facility])     MEDICATIONS: Current Outpatient Medications on File Prior to Visit  Medication Sig Dispense Refill  . acetaminophen-codeine (TYLENOL #3) 300-30 MG tablet Take 1 tablet by mouth every 4 (four) hours as needed.    Marland Kitchen albuterol (VENTOLIN HFA) 108 (90 Base) MCG/ACT inhaler Inhale 2 puffs into the lungs every 6 (six) hours as needed for wheezing or shortness of breath.     Marland Kitchen amoxicillin (AMOXIL) 500 MG capsule Take by mouth 3 (three) times daily.    . Ascorbic Acid (VITAMIN C) 1000 MG tablet Take 1,000 mg by mouth daily.    . Calcium Carb-Cholecalciferol (CALCIUM 1000 + D) 1000-800 MG-UNIT TABS Take by mouth.    . cyclobenzaprine (FLEXERIL) 10 MG tablet Take 10 mg by mouth at bedtime.    . folic acid (FOLVITE) 1 MG tablet Take 666 mg by mouth daily.    . methocarbamol (ROBAXIN) 500 MG tablet Take 1 tablet (500 mg total) by mouth 3 (three) times daily. 21 tablet 0  . Multiple Vitamins-Minerals (MULTIVITAMIN WITH MINERALS) tablet Take 1 tablet by mouth daily.    Marland Kitchen oxyCODONE-acetaminophen (PERCOCET) 5-325 MG tablet Take 1 tablet by mouth every 4 (four) hours as needed for severe pain. 20 tablet 0  . PHENobarbital (LUMINAL) 97.2 MG tablet Take 1.5 tablets each evening 135 tablet 3  . POTASSIUM AMINOBENZOATE PO Take 20 mg by mouth. Takes qd otc    . vitamin B-12 (CYANOCOBALAMIN) 1000 MCG tablet Take 1,000 mcg by mouth daily.    Marland Kitchen lidocaine (LIDODERM) 5 % Place 1 patch onto the skin daily. Remove & Discard patch within 12 hours or as directed by MD (Patient not taking: Reported on 08/29/2019) 30 patch 0   No current facility-administered medications on file prior to visit.    ALLERGIES: Allergies  Allergen Reactions  . Lamictal [Lamotrigine] Nausea And  Vomiting and Rash  . Lortab [Hydrocodone-Acetaminophen] Other (See Comments)    Auras  . Nsaids   . Gabapentin Other (See Comments)    auras    FAMILY HISTORY: Family History  Problem Relation Age of Onset  . Breast cancer Maternal Grandmother   . Alcohol abuse Father   . Lung disease Father   . Epilepsy Father     SOCIAL HISTORY: Social History   Socioeconomic History  . Marital status: Legally Separated    Spouse name: Not on file  . Number of children: Not on file  . Years of education: 69  . Highest education level: Not on file  Occupational History  . Occupation: tech  Tobacco Use  . Smoking status: Current Some Day Smoker    Packs/day: 0.50    Types: Cigarettes  . Smokeless tobacco: Never Used  Substance and Sexual Activity  . Alcohol use: No  . Drug use: No  . Sexual activity: Yes  Other Topics Concern  . Not on file  Social History Narrative   Right handed   Two story home   A lot of caffeine   Social Determinants of Health   Financial Resource Strain:   . Difficulty of Paying Living Expenses:   Food Insecurity:   . Worried About Programme researcher, broadcasting/film/video in the Last Year:   . Ran  Out of Food in the Last Year:   Transportation Needs:   . Lack of Transportation (Medical):   Marland Kitchen Lack of Transportation (Non-Medical):   Physical Activity:   . Days of Exercise per Week:   . Minutes of Exercise per Session:   Stress:   . Feeling of Stress :   Social Connections:   . Frequency of Communication with Friends and Family:   . Frequency of Social Gatherings with Friends and Family:   . Attends Religious Services:   . Active Member of Clubs or Organizations:   . Attends Banker Meetings:   Marland Kitchen Marital Status:   Intimate Partner Violence:   . Fear of Current or Ex-Partner:   . Emotionally Abused:   Marland Kitchen Physically Abused:   . Sexually Abused:     REVIEW OF SYSTEMS: Constitutional: No fevers, chills, or sweats, no generalized fatigue, change in  appetite Eyes: No visual changes, double vision, eye pain Ear, nose and throat: No hearing loss, ear pain, nasal congestion, sore throat Cardiovascular: No chest pain, palpitations Respiratory:  No shortness of breath at rest or with exertion, wheezes GastrointestinaI: No nausea, vomiting, diarrhea, abdominal pain, fecal incontinence Genitourinary:  No dysuria, urinary retention or frequency Musculoskeletal:  No neck pain, back pain Integumentary: No rash, pruritus, skin lesions Neurological: as above Psychiatric: No depression, insomnia, anxiety Endocrine: No palpitations, fatigue, diaphoresis, mood swings, change in appetite, change in weight, increased thirst Hematologic/Lymphatic:  No anemia, purpura, petechiae. Allergic/Immunologic: no itchy/runny eyes, nasal congestion, recent allergic reactions, rashes  PHYSICAL EXAM: Vitals:   08/29/19 0951  BP: 99/71  Pulse: 94  Resp: 20  SpO2: 96%   General: No acute distress Head:  Normocephalic/atraumatic Skin/Extremities: No rash, no edema Neurological Exam: alert and oriented to person, place, and time. No aphasia or dysarthria. Fund of knowledge is appropriate.  Recent and remote memory are intact.  Attention and concentration are normal.  Cranial nerves: Pupils equal, round, reactive to light.Extraocular movements intact with no nystagmus. Visual fields full. No facial asymmetry. Motor: Bulk and tone normal, muscle strength 5/5 throughout with no pronator drift.  Deep tendon reflexes +1 throughout, toes downgoing.  Finger to nose testing intact.  Gait narrow-based and steady, able to tandem walk adequately.  Romberg negative.   IMPRESSION: This is a pleasant 49 yo RH woman with a history of seizures since childhood. 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 EEG in February  2013 reported as normal. There is also note of non-epileptic events, however I do not find any records on this. She has been doing well seizure-free since June 2017, no side effects on Phenobarbital 97.2mg  1.5 tabs daily. Refills sent. We discussed monitoring bone health with long-term Phenobarbital use, bone density scan due this year (every 3 years). She is aware of [place] driving laws to stop driving after a seizure until 6 months seizure-free, follow-up in 1 year, she knows to call for any changes.   Thank you for allowing me to participate in her care.  Please do not hesitate to call for any questions or concerns.   Patrcia Dolly, M.D.   CC: Misty Stanley, PA-C
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
## 2019-09-05 ENCOUNTER — Telehealth: Payer: Self-pay | Admitting: Neurology

## 2019-09-05 NOTE — Telephone Encounter (Signed)
Pharmacy called Dr Elenora Gamma refill they had the one you called in last week they said it was 2 days to soon to refill with out an ok,

## 2019-09-05 NOTE — Telephone Encounter (Signed)
Can you pls confirm what happened, I sent in the prescription only a week ago when she was in the office? They did not get it or something happened to prescription from last week?

## 2019-09-05 NOTE — Telephone Encounter (Signed)
Judeth Cornfield from [facility] left message with [employer] 5/9 @3 :50pm:  "Caller states she is wanting to get the okay for an early refill for Phenobarbital."  On call provider was paged.

## 2019-09-20 ENCOUNTER — Telehealth: Payer: Self-pay | Admitting: Neurology

## 2019-09-20 NOTE — Telephone Encounter (Signed)
The patient called in wanting to see if she needs to come in and be seen before the next available appointment in October. She went to the dentist and had a tooth extracted. She stated they had to do multiple injections to get her numb. Ever since she has been having multiple vocal seizures. Before this dentist appointment she hadn't had one in over a year.

## 2019-10-03 NOTE — Telephone Encounter (Signed)
F/u scheduled 10/03/2019

## 2019-10-04 ENCOUNTER — Telehealth (INDEPENDENT_AMBULATORY_CARE_PROVIDER_SITE_OTHER): Payer: Managed Care, Other (non HMO) | Admitting: Neurology

## 2019-10-04 ENCOUNTER — Other Ambulatory Visit: Payer: Self-pay

## 2019-10-04 ENCOUNTER — Encounter: Payer: Self-pay | Admitting: Neurology

## 2019-10-04 VITALS — Ht 64.0 in | Wt 240.0 lb

## 2019-10-04 DIAGNOSIS — R4789 Other speech disturbances: Secondary | ICD-10-CM | POA: Diagnosis not present

## 2019-10-04 DIAGNOSIS — G40109 Localization-related (focal) (partial) symptomatic epilepsy and epileptic syndromes with simple partial seizures, not intractable, without status epilepticus: Secondary | ICD-10-CM | POA: Diagnosis not present
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
## 2019-10-06 ENCOUNTER — Other Ambulatory Visit: Payer: Managed Care, Other (non HMO)

## 2019-11-03 ENCOUNTER — Encounter: Payer: Self-pay | Admitting: Neurology

## 2019-11-03 ENCOUNTER — Telehealth: Payer: Self-pay | Admitting: Neurology

## 2019-11-03 NOTE — Telephone Encounter (Signed)
We have called and left  A couple of messages for patient to sch a EEG with not return calls from patient. We will mail out a letter to ask her to call in for a appt for the EEG

## 2019-11-03 NOTE — Progress Notes (Signed)
Pls make note on her Chart about repeat phone calls with no answer, and pls send a letter to schedule EEG. Thanks!

## 2019-11-07 ENCOUNTER — Other Ambulatory Visit: Payer: Managed Care, Other (non HMO)

## 2019-11-11 ENCOUNTER — Inpatient Hospital Stay: Admission: RE | Admit: 2019-11-11 | Payer: Managed Care, Other (non HMO) | Source: Ambulatory Visit

## 2020-01-11 ENCOUNTER — Other Ambulatory Visit: Payer: Self-pay

## 2020-01-11 ENCOUNTER — Telehealth (INDEPENDENT_AMBULATORY_CARE_PROVIDER_SITE_OTHER): Payer: Self-pay | Admitting: Neurology

## 2020-01-11 DIAGNOSIS — Z5329 Procedure and treatment not carried out because of patient's decision for other reasons: Secondary | ICD-10-CM

## 2020-01-12 NOTE — Progress Notes (Signed)
Patient forgot about appointment, she was at work and unable to do virtual visit.

## 2020-03-27 ENCOUNTER — Other Ambulatory Visit: Payer: Self-pay | Admitting: Neurology

## 2020-03-27 DIAGNOSIS — G40109 Localization-related (focal) (partial) symptomatic epilepsy and epileptic syndromes with simple partial seizures, not intractable, without status epilepticus: Secondary | ICD-10-CM

## 2020-03-28 NOTE — Telephone Encounter (Signed)
Patient called in wanting to make sure we got the request from [facility] on [address] in [place], [place] for her Luminal. She will be out in the next few days.

## 2020-06-21 ENCOUNTER — Other Ambulatory Visit: Payer: Self-pay | Admitting: Neurology

## 2020-06-21 DIAGNOSIS — G40109 Localization-related (focal) (partial) symptomatic epilepsy and epileptic syndromes with simple partial seizures, not intractable, without status epilepticus: Secondary | ICD-10-CM

## 2020-08-28 ENCOUNTER — Ambulatory Visit: Payer: Managed Care, Other (non HMO) | Admitting: Neurology

## 2020-09-08 IMAGING — DX DG HIP (WITH OR WITHOUT PELVIS) 2-3V RIGHT
3 series · 3 of 3 positions shown · non-contrast
Comparison: None.

CLINICAL DATA: Fall, right hip pain

EXAM:
DG HIP (WITH OR WITHOUT PELVIS) 2-3V RIGHT

[pelvis ap]
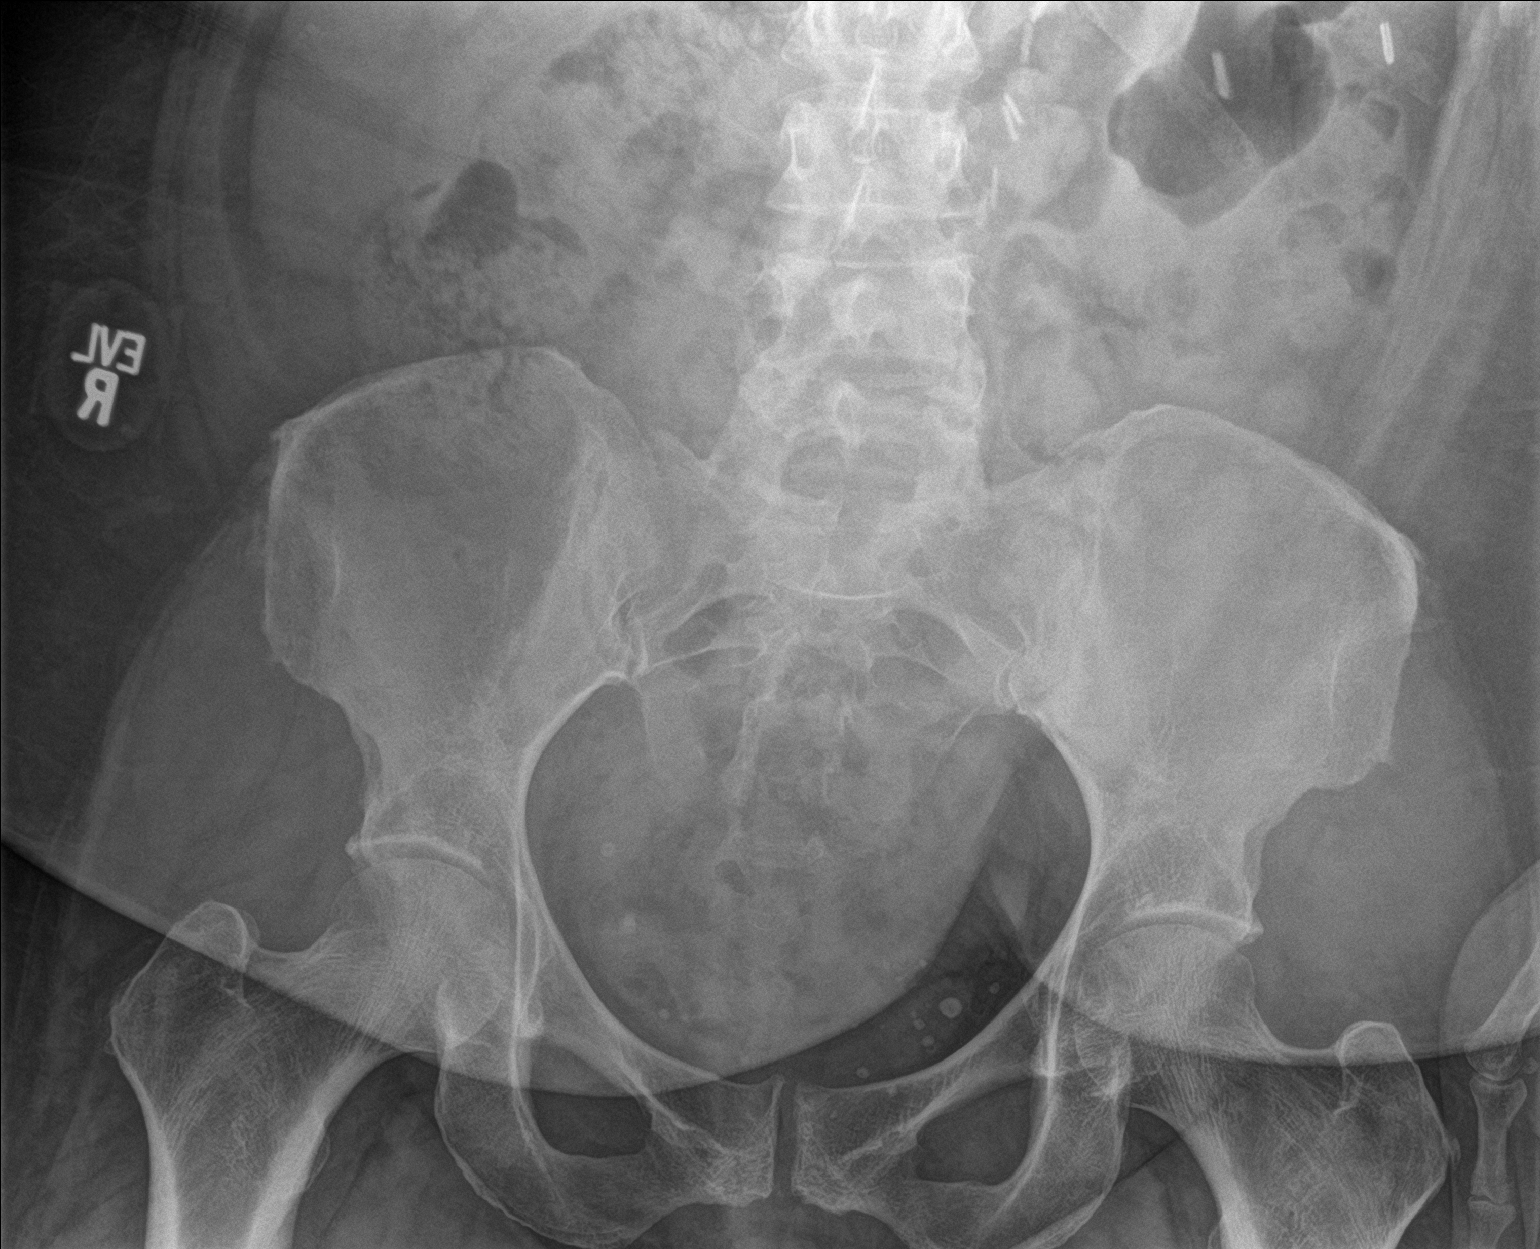

[hip ap]
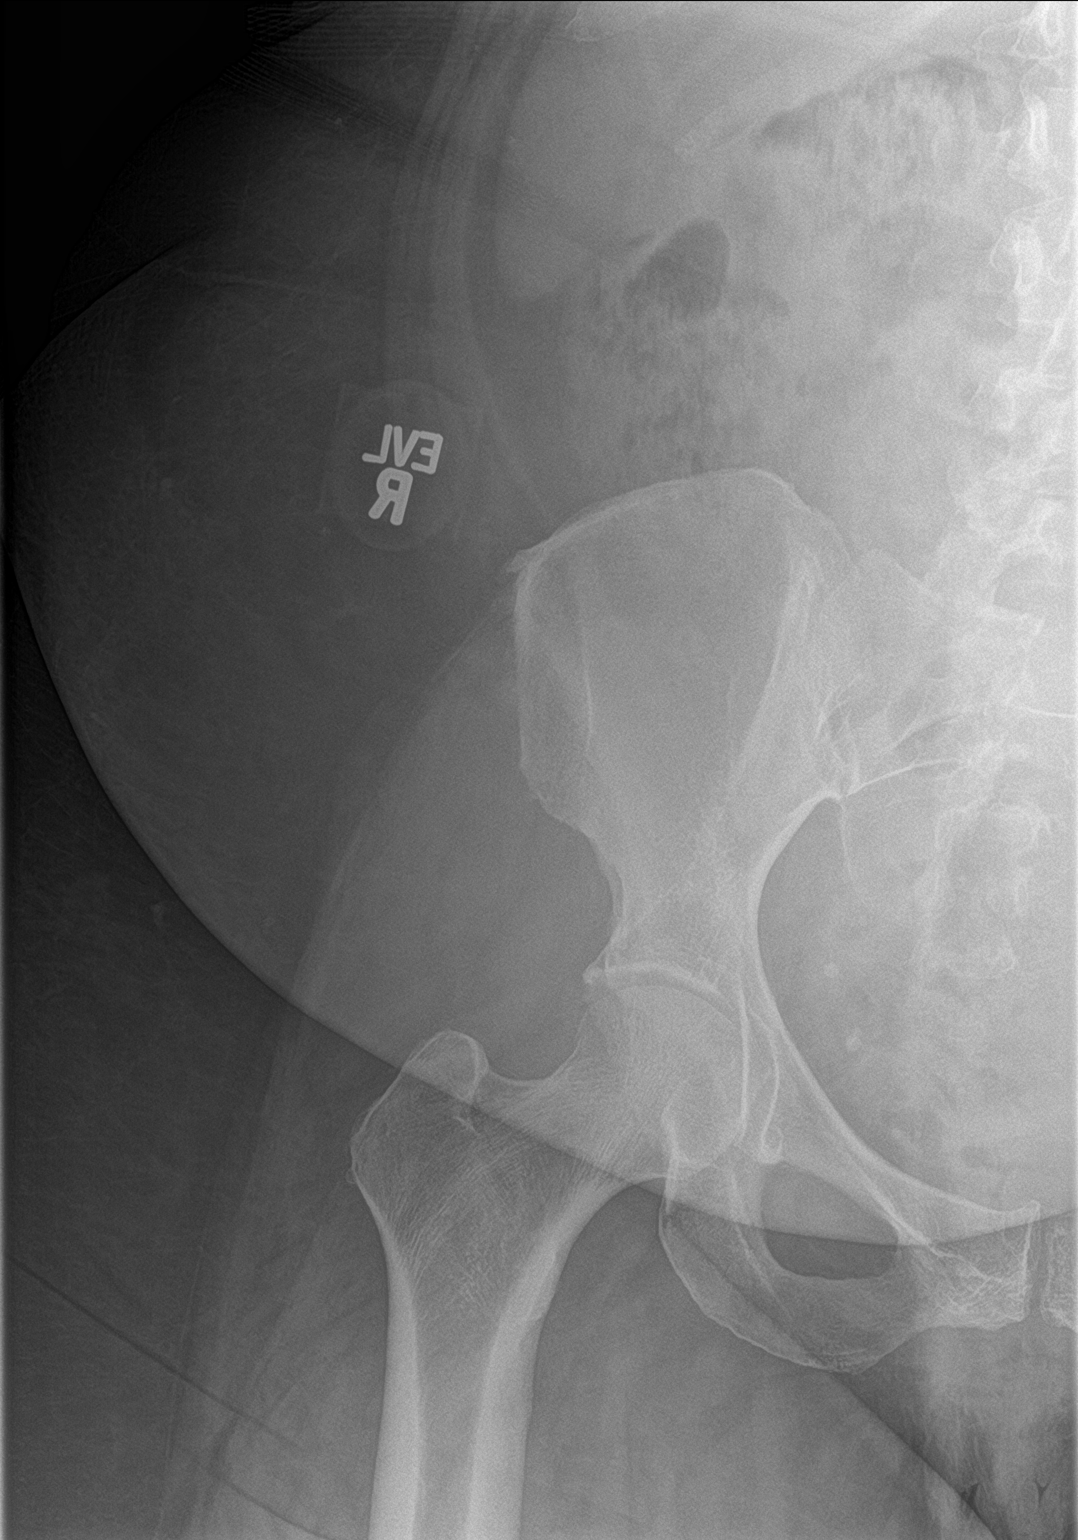

[hip lat]
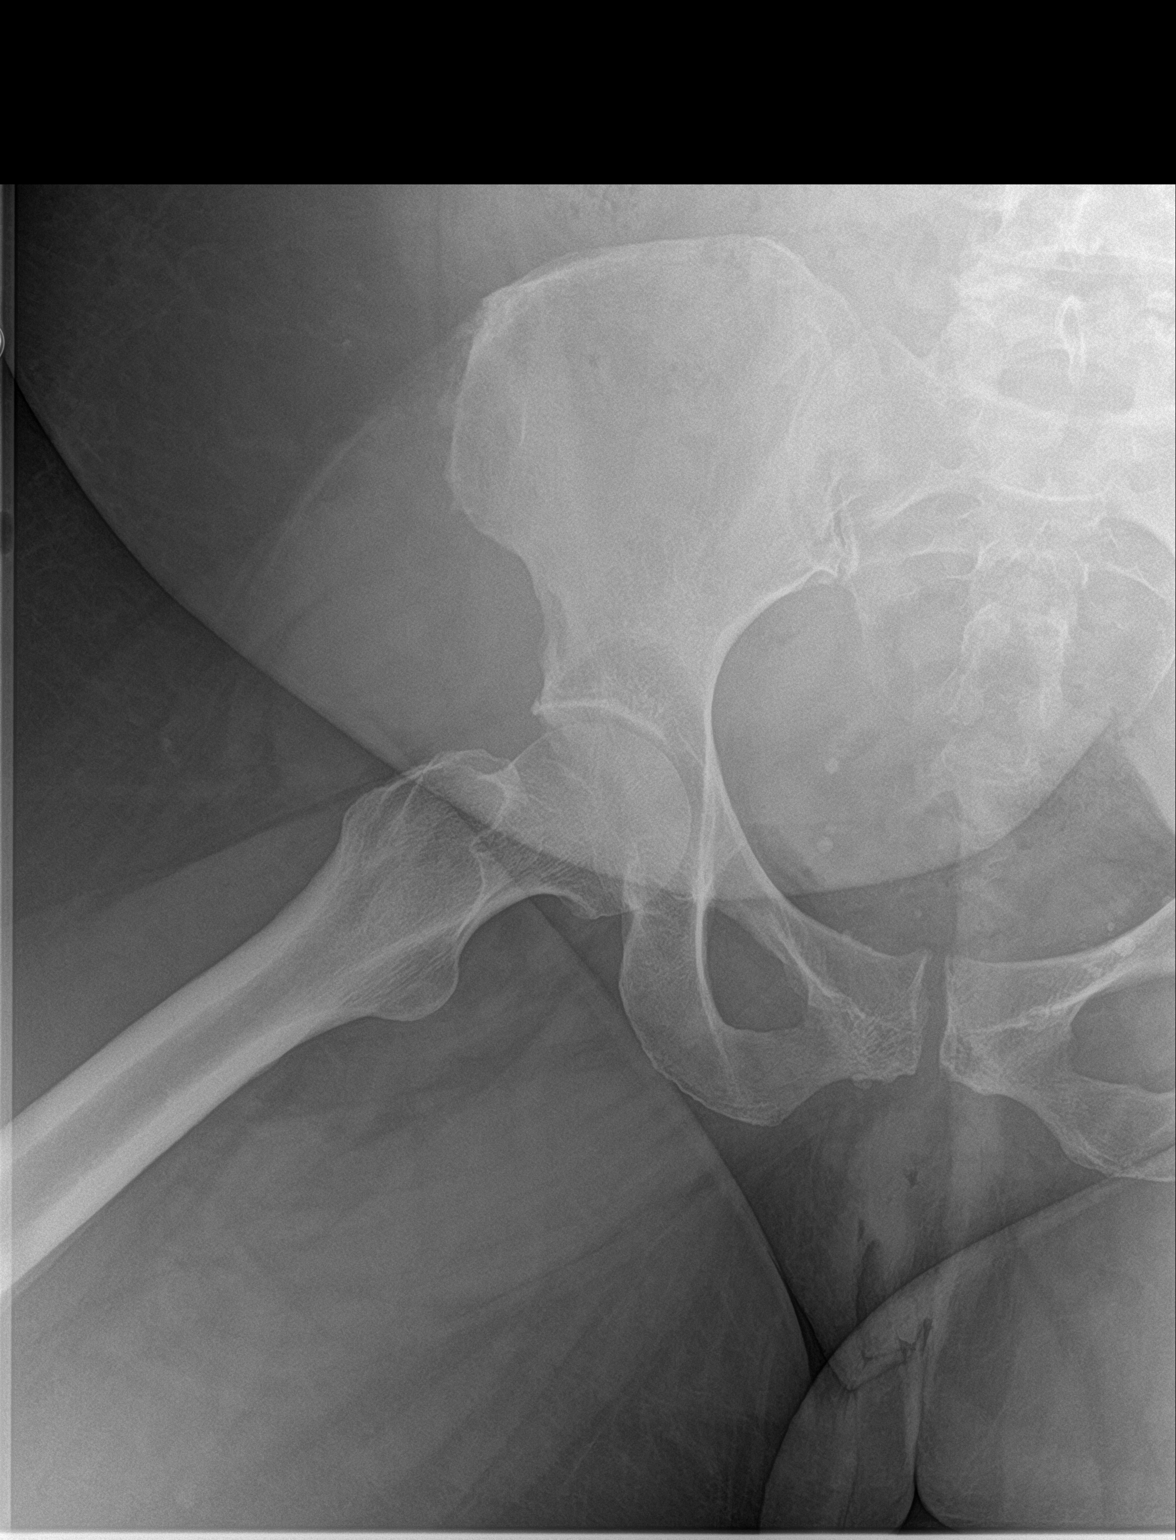

[3 of 3 positions shown; findings below may reference images not displayed]

FINDINGS: There is no evidence of hip fracture or dislocation. There is no
evidence of arthropathy or other focal bone abnormality.
IMPRESSION: Negative.

## 2021-07-03 ENCOUNTER — Ambulatory Visit: Payer: No Typology Code available for payment source | Admitting: Pain Medicine

## 2021-08-14 ENCOUNTER — Ambulatory Visit: Payer: No Typology Code available for payment source | Admitting: Pain Medicine

## 2021-10-14 ENCOUNTER — Ambulatory Visit: Payer: No Typology Code available for payment source | Admitting: Pain Medicine

## 2021-11-27 ENCOUNTER — Ambulatory Visit: Payer: Self-pay | Admitting: Pain Medicine

## 2022-10-23 ENCOUNTER — Emergency Department: Payer: 59

## 2022-10-23 ENCOUNTER — Other Ambulatory Visit: Payer: Self-pay

## 2022-10-23 ENCOUNTER — Emergency Department
Admission: EM | Admit: 2022-10-23 | Discharge: 2022-10-24 | Disposition: A | Discharge status: Home / Self Care | Payer: 59 | Attending: Emergency Medicine | Admitting: Emergency Medicine

## 2022-10-23 ENCOUNTER — Encounter: Payer: Self-pay | Admitting: Emergency Medicine

## 2022-10-23 DIAGNOSIS — R55 Syncope and collapse: Secondary | ICD-10-CM | POA: Diagnosis present

## 2022-10-23 DIAGNOSIS — J45909 Unspecified asthma, uncomplicated: Secondary | ICD-10-CM | POA: Insufficient documentation

## 2022-10-23 LAB — URINALYSIS, ROUTINE W REFLEX MICROSCOPIC
Bilirubin Urine: NEGATIVE
Glucose, UA: NEGATIVE mg/dL
Hgb urine dipstick: NEGATIVE
Ketones, ur: NEGATIVE mg/dL
Leukocytes,Ua: NEGATIVE
Nitrite: NEGATIVE
Protein, ur: NEGATIVE mg/dL
Specific Gravity, Urine: 1.01 (ref 1.005–1.030)
pH: 5 (ref 5.0–8.0)

## 2022-10-23 LAB — CBC
HCT: 35.6 % — ABNORMAL LOW (ref 36.0–46.0)
Hemoglobin: 11.6 g/dL — ABNORMAL LOW (ref 12.0–15.0)
MCH: 31.1 pg (ref 26.0–34.0)
MCHC: 32.6 g/dL (ref 30.0–36.0)
MCV: 95.4 fL (ref 80.0–100.0)
Platelets: 163 10*3/uL (ref 150–400)
RBC: 3.73 MIL/uL — ABNORMAL LOW (ref 3.87–5.11)
RDW: 12.2 % (ref 11.5–15.5)
WBC: 4.6 10*3/uL (ref 4.0–10.5)
nRBC: 0 % (ref 0.0–0.2)

## 2022-10-23 LAB — BASIC METABOLIC PANEL
Anion gap: 8 (ref 5–15)
BUN: 8 mg/dL (ref 6–20)
CO2: 25 mmol/L (ref 22–32)
Calcium: 8.8 mg/dL — ABNORMAL LOW (ref 8.9–10.3)
Chloride: 104 mmol/L (ref 98–111)
Creatinine, Ser: 0.81 mg/dL (ref 0.44–1.00)
GFR, Estimated: >60 mL/min (ref >60)
Glucose, Bld: 90 mg/dL (ref 70–99)
Potassium: 3.8 mmol/L (ref 3.5–5.1)
Sodium: 137 mmol/L (ref 135–145)

## 2022-10-23 LAB — TROPONIN I (HIGH SENSITIVITY)
Troponin I (High Sensitivity): <2 ng/L (ref <18)
Troponin I (High Sensitivity): 3 ng/L (ref <18)

## 2022-10-23 LAB — CBG MONITORING, ED: Glucose-Capillary: 86 mg/dL (ref 70–99)

## 2022-10-23 MED ORDER — SODIUM CHLORIDE 0.9 % IV BOLUS
1000.0000 mL | Freq: Once | INTRAVENOUS | Status: AC
  Administered 2022-10-24: 1000 mL via INTRAVENOUS

## 2022-10-23 MED ORDER — ONDANSETRON HCL 4 MG/2ML IJ SOLN
4.0000 mg | Freq: Once | INTRAMUSCULAR | Status: AC
  Administered 2022-10-24: 4 mg via INTRAVENOUS
  Filled 2022-10-23: qty 2

## 2022-10-23 NOTE — ED Triage Notes (Signed)
Pt was BIB [facility]. Sts she had been shopping at [employer]. She was having a conversation with her daughter when she suddenly felt weak and dizzy followed by LOC. No injury from LOC. Per note, was taken off seizure medication, unable to state name, approx 1.5 weeks ago. Denies aura/seizure like s/s. Is A&Ox4 in triage.

## 2022-10-23 NOTE — ED Provider Notes (Signed)
[facility] Provider Note    Event Date/Time   First MD Initiated Contact with Patient 10/23/22 2343     (approximate)   History   Loss of Consciousness   HPI  Misty Foster is a 52 y.o. female [facility] with a chief complaint of heat exposure and near syncope.  Patient and her daughter were outside in the shade for 2 hours when she suddenly felt unwell and generally weak.  She felt like she could not hold her body up so her daughter helped to lower her onto the ground so that she did not injure herself.  Daughter states patient did not completely lose consciousness.  Prior to that patient had been upset all day because she failed an exam.  Denies recent illnesses.  She takes phenobarbital for seizures.  Up until 2 weeks ago her neurologist had placed her on duloxetine to decrease auras from seizures.  She did not like the way it felt so she tapered off of it.  Currently endorses mild frontal headache and nausea.  Denies vision changes, neck pain, chest pain, shortness of breath, abdominal pain, vomiting or diarrhea.     Past Medical History   Past Medical History:  Diagnosis Date   Arthritis    Asthma    Back pain    Chronic back pain    Incomplete rotator cuff tear or rupture of left shoulder, not specified as traumatic 04/07/2017   Seizures (HCC)    Tuberous sclerosis (HCC)      Active Problem List   Patient Active Problem List   Diagnosis Date Noted   Prediabetes 05/10/2018   Hyperlipidemia 05/06/2018   OSA (obstructive sleep apnea) 05/06/2018   Seizure disorder (HCC) 05/06/2018   Morbid obesity due to excess calories (HCC) 04/30/2018   Incomplete rotator cuff tear or rupture of left shoulder, not specified as traumatic 04/07/2017   Left rotator cuff tear 04/07/2017   Left shoulder pain 01/01/2017   Pain in right hip 11/10/2016   Chronic right-sided low back pain without sciatica 11/10/2016    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, not intractable, without status epilepticus (HCC) 06/20/2016   Neuropathy 06/20/2016   Chronic back pain 05/01/2016   Migraine without aura and without status migrainosus, not intractable 02/11/2015   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     Past Surgical History   Past Surgical History:  Procedure Laterality Date   ABLATION     cervical   BIOPSY  05/14/2018   Procedure: BIOPSY;  Surgeon: Kathi Der, MD;  Location: [facility];  Service: Gastroenterology;;   CESAREAN SECTION     x3   ESOPHAGOGASTRODUODENOSCOPY (EGD) WITH PROPOFOL N/A 05/14/2018   Procedure: ESOPHAGOGASTRODUODENOSCOPY (EGD) WITH PROPOFOL;  Surgeon: Kathi Der, MD;  Location: [facility];  Service: Gastroenterology;  Laterality: N/A;   GASTRIC BYPASS  06/04/2018   SHOULDER ARTHROSCOPY WITH ROTATOR CUFF REPAIR Left 04/07/2017   Procedure: SHOULDER ARTHROSCOPY WITH ROTATOR CUFF REPAIR, ACROMIOPLASTY AND EXTENSIVE DEBRIDEMENT;  Surgeon: Teryl Lucy, MD;  Location: [facility] OR;  Service: Orthopedics;  Laterality: Left;     Home Medications   Prior to Admission medications   Medication Sig Start Date End Date Taking? Authorizing Provider  albuterol (VENTOLIN HFA) 108 (90 Base) MCG/ACT inhaler Inhale 2 puffs into the lungs every 6 (six) hours as needed for wheezing or shortness of breath.  09/13/15   [provider]  amoxicillin (AMOXIL)  500 MG capsule Take by mouth 3 (three) times daily. 08/12/19   [provider]  Ascorbic Acid (VITAMIN C) 1000 MG tablet Take 1,000 mg by mouth daily.    [provider]  Calcium Carb-Cholecalciferol (CALCIUM 1000 + D) 1000-800 MG-UNIT TABS Take by mouth.    [provider]  cyclobenzaprine (FLEXERIL) 10 MG tablet Take 10 mg by mouth at bedtime.    [provider]  folic acid (FOLVITE) 1 MG tablet Take 666 mg by mouth daily.     [provider]  lidocaine (LIDODERM) 5 % Place 1 patch onto the skin daily. Remove & Discard patch within 12 hours or as directed by MD 03/26/19   Bill Salinas, PA-C  methocarbamol (ROBAXIN) 500 MG tablet Take 1 tablet (500 mg total) by mouth 3 (three) times daily. 03/26/19   Harlene Salts A, PA-C  Multiple Vitamins-Minerals (MULTIVITAMIN WITH MINERALS) tablet Take 1 tablet by mouth daily.    [provider]  oxyCODONE-acetaminophen (PERCOCET) 5-325 MG tablet Take 1 tablet by mouth every 4 (four) hours as needed for severe pain. 11/11/18   Molpus, John, MD  PHENobarbital (LUMINAL) 97.2 MG tablet TAKE 1 AND 1/2 TABLETS BY MOUTH EVERY EVENING 06/21/20   Van Clines, MD  POTASSIUM AMINOBENZOATE PO Take 20 mg by mouth. Takes qd otc    [provider]  vitamin B-12 (CYANOCOBALAMIN) 1000 MCG tablet Take 1,000 mcg by mouth daily.    [provider]     Allergies  Lamictal [lamotrigine], Lortab [hydrocodone-acetaminophen], Nsaids, and Gabapentin   Family History   Family History  Problem Relation Age of Onset   Breast cancer Maternal Grandmother    Alcohol abuse Father    Lung disease Father    Epilepsy Father      Physical Exam  Triage Vital Signs: ED Triage Vitals  Enc Vitals Group     BP 10/23/22 1956 94/61     Pulse Rate 10/23/22 1956 (!) 56     Resp 10/23/22 1956 14     Temp 10/23/22 1956 97.8 F (36.6 C)     Temp Source 10/23/22 1956 Oral     SpO2 10/23/22 1956 100 %     Weight 10/23/22 1959 225 lb (102.1 kg)     Height 10/23/22 1959 5\' 4"  (1.626 m)     Head Circumference --      Peak Flow --      Pain Score 10/23/22 1958 0     Pain Loc --      Pain Edu? --      Excl. in GC? --     Updated Vital Signs: BP 107/69 (BP Location: Left Arm)   Pulse (!) 58   Temp 98 F (36.7 C) (Oral)   Resp 16   Ht 5\' 4"  (1.626 m)   Wt 102.1 kg   SpO2 98%   BMI 38.62 kg/m    General: Awake, no distress.  CV:  RRR.  Good peripheral  perfusion.  Resp:  Normal effort.  CTAB. Abd:  Nontender.  No distention.  Other:  PERRL.  EOMI.  No carotid bruits.  Alert and oriented x 3.  CN II-XII grossly intact.  5/5 motor strength and sensation all extremities.  MAEx4. Ambulatory with steady gait.   ED Results / Procedures / Treatments  Labs (all labs ordered are listed, but only abnormal results are displayed) Labs Reviewed  BASIC METABOLIC PANEL - Abnormal; Notable for the following components:  Result Value   Calcium 8.8 (*)    All other components within normal limits  CBC - Abnormal; Notable for the following components:   RBC 3.73 (*)    Hemoglobin 11.6 (*)    HCT 35.6 (*)    All other components within normal limits  URINALYSIS, ROUTINE W REFLEX MICROSCOPIC - Abnormal; Notable for the following components:   Color, Urine YELLOW (*)    APPearance HAZY (*)    All other components within normal limits  CK  MAGNESIUM  PHENOBARBITAL LEVEL  CBG MONITORING, ED  TROPONIN I (HIGH SENSITIVITY)  TROPONIN I (HIGH SENSITIVITY)     EKG  ED ECG REPORT I, Kristianna Saperstein J, the attending physician, personally viewed and interpreted this ECG.   Date: 10/23/2022  EKG Time: 2007  Rate: 53  Rhythm: sinus bradycardia  Axis: Normal  Intervals:none  ST&T Change: Nonspecific    RADIOLOGY I have independently visualized and interpreted patient's CT scan as well as noted the radiology interpretation:  CT head: No ICH  Official radiology report(s): CT Head Wo Contrast  Result Date: 10/23/2022 CLINICAL DATA:  Mental status change, unknown cause EXAM: CT HEAD WITHOUT CONTRAST TECHNIQUE: Contiguous axial images were obtained from the base of the skull through the vertex without intravenous contrast. RADIATION DOSE REDUCTION: This exam was performed according to the departmental dose-optimization program which includes automated exposure control, adjustment of the mA and/or kV according to patient size and/or use of iterative  reconstruction technique. COMPARISON:  CT head 04/13/2017 FINDINGS: Brain: No evidence of large-territorial acute infarction. No parenchymal hemorrhage. No mass lesion. No extra-axial collection. No mass effect or midline shift. No hydrocephalus. Basilar cisterns are patent. Vascular: No hyperdense vessel. Skull: No acute fracture or focal lesion. Sinuses/Orbits: Paranasal sinuses and mastoid air cells are clear. The orbits are unremarkable. Other: None. IMPRESSION: No acute intracranial abnormality. Electronically Signed   By: Tish Frederickson M.D.   On: 10/23/2022 23:57     PROCEDURES:  Critical Care performed: No  .1-3 Lead EKG Interpretation  Performed by: Irean Hong, MD Authorized by: Irean Hong, MD     Interpretation: normal     ECG rate:  95   ECG rate assessment: normal     Rhythm: sinus rhythm     Ectopy: none     Conduction: normal   Comments:     Patient placed on cardiac monitor to evaluate for arrhythmias    MEDICATIONS ORDERED IN ED: Medications  calcium gluconate 1 g/ 50 mL sodium chloride IVPB (1,000 mg Intravenous Patient Refused/Not Given 10/24/22 0229)  sodium chloride 0.9 % bolus 1,000 mL (1,000 mLs Intravenous Patient Refused/Not Given 10/24/22 0229)  sodium chloride 0.9 % bolus 1,000 mL (0 mLs Intravenous Stopped 10/24/22 0105)  ondansetron (ZOFRAN) injection 4 mg (4 mg Intravenous Given 10/24/22 0014)  metoCLOPramide (REGLAN) injection 5 mg (5 mg Intravenous Given 10/24/22 0129)     IMPRESSION / MDM / ASSESSMENT AND PLAN / ED COURSE  I reviewed the triage vital signs and the nursing notes.                             52 year old female presenting with near syncope.  Show diagnosis includes but is not limited to CVA, ACS, infectious, metabolic, heat illness etiologies, etc.  Personally viewed patient's records and note a neurology office visit on 08/20/2022 for seizures.  Patient's presentation is most consistent with acute presentation with potential threat  to life or bodily function.  The patient is on the cardiac monitor to evaluate for evidence of arrhythmia and/or significant heart rate changes.  Laboratory results demonstrate normal WBC 4.6, normal electrolytes, negative troponins x 2.  UA negative.  Will obtain orthostatic vital signs, check CK, magnesium levels, check phenobarbital level.  Administer IV fluid hydration, Zofran for nausea and reassess.  Clinical Course as of 10/24/22 0342  Fri Oct 24, 2022  [id number] Patient was dizzy on standing for orthostatics.  Since then, she has had IV fluids, Reglan after Zofran for nausea.  Headache and dizziness improved.  Nurse reported that patient's mouth was drawing up.  That has since resolved.  Patient states this happens to her often.  She has missed the past 3 weeks of her calcium supplementation.  Will dose IV. [JS]  [id number] Patient is tired and wants to go home.  Declines draw for phenobarbital level, IV calcium or additional fluids.  Overall is feeling significantly better.  Strict return precautions given.  Patient and daughter verbalized understanding and agree with plan of care. [JS]    Clinical Course User Index [JS] Irean Hong, MD     FINAL CLINICAL IMPRESSION(S) / ED DIAGNOSES   Final diagnoses:  Near syncope  Hypocalcemia     Rx / DC Orders   ED Discharge Orders     None        Note:  This document was prepared using Dragon voice recognition software and may include unintentional dictation errors.   Irean Hong, MD 10/24/22 [phone]

## 2022-10-23 NOTE — ED Triage Notes (Signed)
First Nurse Note:  [facility]. Pt reports heat exposure after being outside for ~2.5 hours. Reports weakness and near syncope while talking to daughter. States to EMS that she has not had a lot of water today.   125/79 CBG 93 99% RA HR 65  20G PIV in R arm placed by EMS. 500 mL bolus given en route.

## 2022-10-23 NOTE — ED Notes (Signed)
Lab called to ensure that troponin is running. Lab states will put into process at this time.

## 2022-10-24 LAB — MAGNESIUM: Magnesium: 2.1 mg/dL (ref 1.7–2.4)

## 2022-10-24 LAB — CK: Total CK: 116 U/L (ref 38–234)

## 2022-10-24 MED ORDER — CALCIUM GLUCONATE-NACL 1-0.675 GM/50ML-% IV SOLN
1.0000 g | Freq: Once | INTRAVENOUS | Status: DC
  Filled 2022-10-24: qty 50

## 2022-10-24 MED ORDER — SODIUM CHLORIDE 0.9 % IV BOLUS: 1000.0000 mL | Freq: Once | INTRAVENOUS | Status: DC

## 2022-10-24 MED ORDER — METOCLOPRAMIDE HCL 5 MG/ML IJ SOLN
5.0000 mg | Freq: Once | INTRAMUSCULAR | Status: AC
  Administered 2022-10-24: 5 mg via INTRAVENOUS
  Filled 2022-10-24: qty 2

## 2022-10-24 NOTE — ED Notes (Signed)
Pt states she feels lightheaded/ unable to assess standing orthostatic vitals at this time/ MD made aware.

## 2022-10-24 NOTE — Discharge Instructions (Signed)
Please restart your calcium supplements.  Stay indoors in the air conditioning and drink plenty of fluids this weekend.  Return to the ER for recurrent or worsening symptoms, persistent vomiting, difficulty breathing or other concerns.

## 2022-10-24 NOTE — ED Notes (Signed)
Pt refusing labs and medication/ pt states she is ready to go home/ MD made aware.
